# Patient Record
Sex: Female | Born: 1990 | Race: Black or African American | Hispanic: No | Marital: Married | Smoking: Never smoker
Health system: Southern US, Community
[De-identification: ages and names within clinical notes are randomized; demographics above are authoritative.]

## PROBLEM LIST (undated history)

## (undated) ENCOUNTER — Inpatient Hospital Stay (HOSPITAL_COMMUNITY): Payer: Self-pay

## (undated) DIAGNOSIS — K219 Gastro-esophageal reflux disease without esophagitis: Secondary | ICD-10-CM

## (undated) HISTORY — DX: Gastro-esophageal reflux disease without esophagitis: K21.9

## (undated) HISTORY — PX: NO PAST SURGERIES: SHX2092

---

## 2012-03-16 ENCOUNTER — Encounter (HOSPITAL_COMMUNITY): Payer: Self-pay | Admitting: *Deleted

## 2012-03-16 ENCOUNTER — Emergency Department (HOSPITAL_COMMUNITY)
Admission: EM | Admit: 2012-03-16 | Discharge: 2012-03-16 | Disposition: A | Discharge status: Home / Self Care | Payer: Self-pay | Source: Home / Self Care | Attending: Emergency Medicine | Admitting: Emergency Medicine

## 2012-03-16 DIAGNOSIS — Z3400 Encounter for supervision of normal first pregnancy, unspecified trimester: Secondary | ICD-10-CM

## 2012-03-16 DIAGNOSIS — O210 Mild hyperemesis gravidarum: Secondary | ICD-10-CM

## 2012-03-16 DIAGNOSIS — Z3201 Encounter for pregnancy test, result positive: Secondary | ICD-10-CM

## 2012-03-16 LAB — POCT URINALYSIS DIP (DEVICE)
Protein, ur: 30 mg/dL — AB
Specific Gravity, Urine: >=1.03 (ref 1.005–1.030)
Urobilinogen, UA: 1 mg/dL (ref 0.0–1.0)

## 2012-03-16 MED ORDER — DOXYLAMINE-PYRIDOXINE 10-10 MG PO TBEC: 1.0000 | DELAYED_RELEASE_TABLET | Freq: Four times a day (QID) | ORAL | Status: DC

## 2012-03-16 MED ORDER — ONDANSETRON 8 MG PO TBDP: 8.0000 mg | ORAL_TABLET | Freq: Three times a day (TID) | ORAL | Status: DC | PRN

## 2012-03-16 NOTE — ED Notes (Signed)
Pt  Reports   Symptoms  Of  Vomiting   From  Pregnancy   She  Reports      She  Is  About  1  Month  Pregnant           And        Did  A  Home  Pregnancy  Test    Which  She  The    Results  She  denys  Any  Bleeding         No  Active  Vomiting   -  She  Is  Sitting  Upright on  Exam table  In no  Severe  Distress   Husband  At  Bedside  No ob gyn yet

## 2012-03-16 NOTE — ED Provider Notes (Signed)
Chief Complaint  Patient presents with  . Emesis    History of Present Illness:    The patient is a 21 year old female who's last menstrual period was November 7. She took a home pregnancy test and was positive. She has had some other symptoms of pregnancy such as breast tenderness and frequent urination. Over the past several weeks she's developed nausea and vomiting. Over the past couple days she vomited up all by mouth intake. She denies fever, chills, abdominal pain, vaginal discharge or spotting, or urinary symptoms. This is her first pregnancy. She has not gotten any prenatal care yet.  Review of Systems:  Other than noted above, the patient denies any of the following symptoms: Constitutional:  No fever, chills, fatigue, weight loss or anorexia. Lungs:  No cough or shortness of breath. Heart:  No chest pain, palpitations, syncope or edema.  No cardiac history. Abdomen:  No nausea, vomiting, hematememesis, melena, diarrhea, or hematochezia. GU:  No dysuria, frequency, urgency, or hematuria. Gyn:  No vaginal discharge, itching, abnormal bleeding, dyspareunia, or pelvic pain.  PMFSH:  Past medical history, family history, social history, meds, and allergies were reviewed along with nurse's notes.  No prior abdominal surgeries, past history of GI problems, STDs or GYN problems.  No history of aspirin or NSAID use.  No excessive alcohol intake.  Physical Exam:   Vital signs:  BP 117/81  Pulse 78  Temp 99.6 F (37.6 C) (Oral)  Resp 17  SpO2 100% Gen:  Alert, oriented, in no distress. Lungs:  Breath sounds clear and equal bilaterally.  No wheezes, rales or rhonchi. Heart:  Regular rhythm.  No gallops or murmers.   Abdomen:  Soft, flat, nondistended. No tenderness to palpation, guarding, or rebound. No organomegaly or mass. Bowel sounds are normally active. Skin:  Clear, warm and dry.  No rash.  Labs:   Results for orders placed during the hospital encounter of 03/16/12  POCT  URINALYSIS DIP (DEVICE)      Component Value Range   Glucose, UA NEGATIVE  NEGATIVE mg/dL   Bilirubin Urine SMALL (*) NEGATIVE   Ketones, ur >=160 (*) NEGATIVE mg/dL   Specific Gravity, Urine >=1.030  1.005 - 1.030   Hgb urine dipstick MODERATE (*) NEGATIVE   pH 5.5  5.0 - 8.0   Protein, ur 30 (*) NEGATIVE mg/dL   Urobilinogen, UA 1.0  0.0 - 1.0 mg/dL   Nitrite NEGATIVE  NEGATIVE   Leukocytes, UA NEGATIVE  NEGATIVE  POCT PREGNANCY, URINE      Component Value Range   Preg Test, Ur POSITIVE (*) NEGATIVE    Assessment:  The primary encounter diagnosis was Pregnancy, first. A diagnosis of Hyperemesis gravidarum was also pertinent to this visit.  Plan:   1.  The following meds were prescribed:   New Prescriptions   DOXYLAMINE-PYRIDOXINE 10-10 MG TBEC    Take 1 tablet by mouth 4 (four) times daily.   ONDANSETRON (ZOFRAN ODT) 8 MG DISINTEGRATING TABLET    Take 1 tablet (8 mg total) by mouth every 8 (eight) hours as needed for nausea.   2.  The patient was instructed in symptomatic care and handouts were given. 3.  The patient was told to return if becoming worse in any way, if no better in 3 or 4 days, and given some red flag symptoms that would indicate earlier return.  Follow up:  The patient was told to follow up with the obstetrics doctor who is on-call today which is the faculty practice  at [facility]. She was also told her that she should continue to have nausea and vomiting, or if she should develop any pregnancy related complications to go to the [facility] emergency room.    Reuben Likes, MD 03/16/12 [phone]

## 2012-03-19 NOTE — L&D Delivery Note (Signed)
\  Delivery Note At 3:03 AM a viable female was delivered via Vaginal, Spontaneous Delivery (Presentation: ; Occiput Anterior).  Baby presented floppy, with no tone and no spontaneous cry.  Thick mec was noted at time of delivery.   Code APGAR was called with prompt response and resuscitation by nursing staff and NICU team.  APGAR: 1, 6; 8  weight 6 lb 7.5 oz (2935 g).   Placenta status: Intact, Spontaneous.  However, large clot and tissue mass approx 10x6cm came out soon after baby.  When placenta delivered, there was a thick dark clot in the placental tissue.  Placenta sent to path.  Cord:3 vessel  with the following complications: None.  Cord pH: 7.07  Anesthesia: Epidural  Episiotomy: None Lacerations: None Suture Repair: na Est. Blood Loss (mL): 300  Mom to postpartum.  Baby to treatment nursery for observation of tachypnea .  Alaynah Schutter L 10/23/2012, 4:05 AM

## 2012-04-10 ENCOUNTER — Encounter: Payer: Self-pay | Admitting: Family Medicine

## 2012-04-10 ENCOUNTER — Ambulatory Visit (INDEPENDENT_AMBULATORY_CARE_PROVIDER_SITE_OTHER): Payer: 59 | Admitting: Family Medicine

## 2012-04-10 VITALS — BP 121/87 | Temp 99.0°F | Ht 68.0 in | Wt 124.0 lb

## 2012-04-10 DIAGNOSIS — Z3490 Encounter for supervision of normal pregnancy, unspecified, unspecified trimester: Secondary | ICD-10-CM | POA: Insufficient documentation

## 2012-04-10 DIAGNOSIS — Z23 Encounter for immunization: Secondary | ICD-10-CM

## 2012-04-10 DIAGNOSIS — Z3400 Encounter for supervision of normal first pregnancy, unspecified trimester: Secondary | ICD-10-CM

## 2012-04-10 DIAGNOSIS — O210 Mild hyperemesis gravidarum: Secondary | ICD-10-CM

## 2012-04-10 LAB — POCT URINALYSIS DIP (DEVICE)
Protein, ur: 100 mg/dL — AB
Specific Gravity, Urine: >=1.03 (ref 1.005–1.030)
Urobilinogen, UA: 1 mg/dL (ref 0.0–1.0)
pH: 6 (ref 5.0–8.0)

## 2012-04-10 LAB — HIV ANTIBODY (ROUTINE TESTING W REFLEX): HIV: NONREACTIVE

## 2012-04-10 MED ORDER — GLYCOPYRROLATE 2 MG PO TABS: 2.0000 mg | ORAL_TABLET | Freq: Three times a day (TID) | ORAL | Status: DC

## 2012-04-10 MED ORDER — PROMETHAZINE HCL 25 MG PO TABS: 25.0000 mg | ORAL_TABLET | Freq: Four times a day (QID) | ORAL | Status: DC | PRN

## 2012-04-10 MED ORDER — INFLUENZA VIRUS VACC SPLIT PF IM SUSP
0.5000 mL | Freq: Once | INTRAMUSCULAR | Status: AC
  Administered 2012-04-10: 0.5 mL via INTRAMUSCULAR

## 2012-04-10 NOTE — Addendum Note (Signed)
Addended by: Franchot Mimes on: 04/10/2012 01:48 PM   Modules accepted: Orders

## 2012-04-10 NOTE — Addendum Note (Signed)
Addended by: Franchot Mimes on: 04/10/2012 12:41 PM   Modules accepted: Orders

## 2012-04-10 NOTE — Patient Instructions (Addendum)
L'allaitement maternel Dcider d'allaiter est l'un des [employer]rex Corporation que vous pouvez faire pour vous et votre bb . L'information Merchandiser, retail un bref aperu des avantages de l'allaitement ainsi que des thmes communs allaitement environnante. Avantages de Engineer, drilling bb Le premier lait ( colostrum ) permet le fonctionnement du systme digestif du bb mieux . Il ya des [employer] Holding Corporation lait de la mre qui aident le bb  combattre les infections . Le bb a une plus faible incidence de l'asthme , les allergies , et syndrome de mort subite du nourrisson [facility] ) . Les nutriments contenus Secondary school teacher lait maternel sont mieux pour le bb que les prparations pour nourrissons et le lait maternel aide le cerveau du bb se dveloppent mieux . Les bbs [employer] de gaz , les coliques et la constipation. Pour la mre L'allaitement maternel contribue  dvelopper un lien trs spcial entre la mre et son bb . L'allaitement maternel est pratique , toujours disponible  la bonne temprature , et ne cote rien . L'allaitement brle les calories dans la mre et l'aide  perdre du poids qui a t acquise au cours de la [place] . L'allaitement rend le contrat de l'utrus vers le bas  la taille normale plus rapide et ralentit les saignements aprs l'accouchement . Les mres qui allaitent ont un risque plus [place] de dvelopper un cancer du sein . ALLAITEMENT FRQUENCE Un bb en bonne sant  terme peut allaiter aussi souvent que toutes les heures ou espace ses ttes pour toutes les 3 heures . Surveillez votre bb pour Lowe's Companies de [place] . Allaiter votre bb si elle montre des signes de [address] . Combien de fois vous infirmire varie d'un bb  . Infirmire aussi souvent que les demandes de bb, ou quand vous vous sentez le besoin de rduire la plnitude de vos seins . Rveillez le bb si elle a t 3  4 heures Education administrator . Une alimentation frquente aidera  la mre faire plus de lait et aidera  prvenir les problmes, tels que les mamelons douloureux et l'engorgement des seins . POSITION DU BB AU SEIN Que couch ou assis , assurez-vous que le ventre de l'enfant est confront  votre ventre . Soutenir Human resources officer 4 doigts sous le sein et le pouce au-dessus . Assurez-vous que vos doigts sont bien loin du mamelon et la [employer] Motors bb . AVC lvres de bb doucement avec votre doigt ou du mamelon . Lorsque la bouche du bb est ouvert assez large , placer tous de votre mamelon et autant de l'arole que possible dans la bouche de votre bb . Tirez le bb en troite afin de la pointe du nez et les joues du bb touchent le sein pendant la tte . Ttes et d'aspiration La dure de chaque tte varie d'un bb  nourrir et  partir de l'alimentation . Le bb doit aspirer environ 2 3 minutes pour votre lait pour se rendre  [employer] . C'est ce qu'on appelle un  laisser tomber . Pour cette raison , Counselling psychologist bb se nourrir de chaque sein aussi longtemps qu'il ou elle veut . Votre bb va finir l'alimentation quand il ou elle a reu le bon quilibre de nutriments . Pour briser IT consultant , Administrator, arts votre [employer] Corporation coin de la bouche du bb et faites-le glisser entre ses gencives avant de retirer le sein de sa bouche . Cela aidera  prvenir les mamelons douloureux . COMMENT  SAVOIR SI VOTRE bb reoit assez de LAIT MATERNEL . Vous vous demandez si votre bb reoit suffisamment de lait est une proccupation commune Cisco . Vous pouvez tre assur que votre bb reoit assez de lait si : Votre bb est activement sucer et vous entendez la dglutition . Votre bb semble dtendu et satisfait aprs une tte . Votre bb infirmires au moins 8  12 reprises dans un laps de [place] de 24 heures . Infirmire votre bb jusqu' ce qu'il se dverrouille ou s'endort au premier sein (au moins 10 20 minutes) , puis offrir le deuxime ct . Votre bb  mouille 5 6 couches jetables ( 6 8 couches en tissu ) dans une priode de 24 heures par 5 6 jours d'ge. Votre bb a au moins 3 selles 4 toutes les 24 heures pendant les 6 premires semaines . Le tabouret doit tre souple et jaune . Votre bb devrait gagner 4 7 onces par semaine aprs qu'il ou elle est de 4 jours. Vos seins sont plus mous aprs la tte . RDUCTION engorgement mammaire Dans la premire semaine aprs la naissance du bb , vous pouvez rencontrer des signes de l'engorgement des seins . Lorsque les seins sont engorgs , ils se sentent lourd, chaud , plein , et peuvent tre [employer] . Vous pouvez rduire l'engorgement si vous : Infirmire frquemment, tous les 2 trois heures . Les mres qui allaitent ont tt et souvent moins de Cytogeneticist. Placez des blocs de glace de lumire sur vos seins pendant 10 20 minutes entre les ttes . Cela permet de rduire Financial planner. Envelopper les packs de glace dans une serviette lgre pour protger votre peau . Sacs de lgumes congels fonctionnent bien  cette fin . Prendre une douche chaude ou appliquer chaud , la chaleur humide de votre poitrine pendant 5 10 minutes juste avant chaque tte . Ceci augmente la circulation et permet l'coulement de lait . Massez doucement votre poitrine avant et pendant l'alimentation . Avec vos doigts , massez de la paroi thoracique vers votre mamelon dans un Tour manager . Assurez-vous que le bb se jette au moins un sein  chaque repas avant de Database administrator de camp . Utiliser un tire-lait pour vider les seins si votre bb est endormi ou non l'allaitement bien . Vous pouvez galement pomper si vous retournez au travail ou que vous sentez que vous avez trouv engorg . viter le biberon , ttines, ou des supplments alimentaires, d'eau ou de jus  la place de Statistician . Le lait maternel est toute la nourriture a besoin de votre bb . Il n'est pas ncessaire pour votre  bb  avoir de l'eau ou de la formule . En fait , pour aider vos seins font plus de lait , il est prfrable de ne pas donner  votre bb des supplments alimentaires durant les premires semaines . Assurez-vous que le bb prend correctement positionn et Chief Financial Officer . Portez un soutien-gorge , en vitant modles  armature . Mangez une alimentation quilibre avec suffisamment de liquides . Reposez-vous souvent , se dtendre et prendre vos vitamines prnatales pour viter la fatigue , le stress et l'anmie . Si vous suivez ces suggestions , votre engorgement devrait s'amliorer en 24 48 heures . Si vous rencontrez toujours des difficults , appelez votre consultante en lactation ou un soignant . Prendre soin de soi Prenez soin de vos seins Prenez un bain ou une douche tous les jours . vitez Dillard's  savon sur vos mamelons . Lancer ttes sur votre sein gauche  une alimentation et sur ??votre poitrine droite  la prochaine tte . Vous remarquerez une augmentation dans la production de lait 2 5 jours aprs l'accouchement. Vous pouvez ressentir un certain inconfort de l'engorgement , ce qui rend vos seins trs fermes et souvent tendre . Engorgement des [employer] Channel Communications 24 48 heures . Dans l'intervalle, appliquer des serviettes humides chaudes sur vos seins pendant 5 10 minutes avant le repas. Massage doux et l'expression de lait avant la tte va adoucir vos seins , ce qui rend plus facile pour votre bb prenne . Portez un soutien-gorge de soins infirmiers et Research scientist (medical) , et scher  l'air vos mamelons pendant 3 4 minutes aprs chaque tte . N'utilisez que des garnitures de soutien du coton . N'utilisez que la lanoline pure sur vos mamelons aprs la tte . Vous n'avez pas besoin de Psychologist, clinical de le nourrir  [employer] [place] [place] [employer] bb . Une autre option consiste  exprimer quelques gouttes de lait maternel et masser doucement dans vos mamelons . Prenez soin de vous Prenez des repas bien  quilibrs et des collations nutritives . Lait , jus de fruits et l'eau potable pour Alcoa Inc ( environ 8 verres par [employer] ) . Prenez beaucoup de repos . vitez les aliments que vous remarquez incidence sur le bb dans une [place] . Obtenir des Land O'Lakes mdicaux SI : Vous avez des Engineer, maintenance (IT) et besoin d'aide . Vous avez une zone [employer] M[employer] dure, rouge , sur votre poitrine qui est accompagne d'une fivre. Votre bb est trop fatigu pour [facility] ou a du mal  dormir . Votre bb mouille moins de 6 couches par jour , de 5 jours d'ge . La peau de votre bb ou blanc de ses yeux est plus jaune que c'tait  l'hpital . Vous vous sentez dprim . Document de Sortie: 03/05/2005 Document de rvision: 18/08/2011 Document de rvision: 17/05/2011 ExitCare  Information pour les patients  2013 [place] , [place] . Hypermse Hypermse est une forme grave de nauses et de vomissements qui se passe pendant la grossesse . Hyperemesis est pire que la Masco Corporation . Il peut causer une femme d'avoir des nauses ou des vomissements toute la journe pendant plusieurs jours. Il peut garder IAC[employer]InterActiveCorp femme de manger et de boire suffisamment d' aliments et les liquides . Hyperemesis survient gnralement au cours du premier semestre ( les 20 premires semaines ) de [place] . Il disparat souvent une fois une femme est dans sa deuxime moiti de la [place] . Cependant, parfois hyperemesis se poursuit  travers toute [employer] . CAUSES La cause de cette condition n'est pas compltement connue , mais on pense tre due  des Land O'Lakes hormones du corps pendant la grossesse. Ce pourrait tre le niveau lev de l'hormone de grossesse ou une augmentation du taux d'strogne Verizon. Symptmes Nauses et vomissements svres . Nauses qui ne va pas plus loin . Vomissements qui ne vous permet pas de garder aucune nourriture . La perte de poids et la perte de  fluide corporel (dshydratation) . N'ayant pas envie de manger ou de ne pas aimer la nourriture que vous avez dj connu . DIAGNOSTIC Votre fournisseur de Bear Stearns poser des questions sur vos symptmes . Votre fournisseur de Arts development officer des tests sanguins et d'urine pour s'assurer que quelque chose n'est pas la cause du problme . TRAITEMENT Vous ne Coca[employer]Cola  besoin de mdicaments pour SYSCO . Si les mdicaments ne contrlent pas la nause et des vomissements , vous serez traits  l'hpital pour prvenir la dshydratation , l'acidose , la perte de poids , et les Land O'Lakes lectrolytes dans l'organisme qui peut nuire au bb  natre ( ftus ) . Vous devrez peut-tre par voie intraveineuse ( IV ) des fluides . INSTRUCTIONS DE SOINS  DOMICILE Prenez tous les mdicaments comme indiqu par votre fournisseur de [place] . Essayez de manger un couple de biscuits secs ou du pain Animator avant de sortir du lit . vitez les aliments et les odeurs qui drangent l'estomac . vitez les aliments gras et pics . Manger 5 ou 6 petits repas par [employer] . Ne buvez pas en Ingram Micro Inc . Boire entre Berks[employer] Hathaway . Pour les collations , manger des aliments riches en protines , Print production planner . Manger ou sucer des choses qui ont gingembre en eux. Le gingembre aide des nauses . vitez la prparation des aliments . L' odeur de la nourriture peut gcher votre apptit . vitez des comprims de fer et fer dans vos multivitamines qu'aprs 3  4 mois de [place] . Obtenir des Land O'Lakes mdicaux SI : Votre douleur abdominale augmente depuis la dernire fois que vous avez vu votre soignant . Vous avez un mal de tte svre. Vous dveloppez des problmes de vision . Vous sentez que vous perdez Kellogg . CONSULTEZ LES SOINS SI : Vous tes incapable de garder les liquides vers le bas. Vous vomissez du sang . Vous avez des nauses et vomissements constants . Vous avez  de la fivre . Vous avez une faiblesse excessive , des tourdissements , des vanouissements ou une soif extrme . ASSUREZ-VOUS : Comprendre ces instructions . Va regarder votre tat . Obtiendront de Celanese Corporation de suite si vous ne faites pas Designer, fashion/clothing . Document de Sortie: 03/05/2005 Document de rvision: 01/20/2012 Document de rvision: 06/05/2010 ExitCare  Information pour les patients  2013 [place] , [place] . Grossesse - Premier trimestre [employer] rapports sexuels , des millions de Museum/gallery curator vagin . Seulement 1 sperme pntrer et fconder l' ovule de la femme alors qu'il est dans la trompe de [place] . Une semaine plus tard , l'ovule fcond dans la paroi de l'utrus . Un embryon commence  se dvelopper en un bb . 6  8 semaines, les yeux et [employer] forms et le rythme cardiaque peut tre vu  l'chographie. A la fin de 12 semaines (le premier trimestre ) , tous les PPG Industries bb sont forms . Maintenant que vous tes enceinte, vous aurez envie de Lubrizol Corporation possible pour Jacobs Engineering un bb en bonne sant . Deux des Honeywell plus Kellogg d'obtenir de bons soins prnatals et de suivre les instructions de votre fournisseur de [place] . Les soins prnataux sont tous les soins mdicaux que vous recevez avant la naissance du bb . Il est administr afin de prvenir , rechercher et traiter les problmes au cours de la grossesse et de Architectural technologist . EXAMENS PRNATALES Au cours des visites prnatales , votre poids , la pression artrielle et l'urine sont vrifis . Ceci est fait pour s'assurer que vous tes en bonne sant et progresse normalement au cours de la [place] . Une femme enceinte devrait gagner 25  35 livres au cours de la [place] . Toutefois, si vous avez plus de poids ou une insuffisance pondrale ,  votre fournisseur de Nutritional therapist au sujet de votre poids. Votre fournisseur de soins sera poser et rpondre  des questions Intel. Des analyses de sang , des cultures cervicales , d'autres tests ncessaires et un test de Pap sont effectus lors de vos examens prnataux . Ces tests sont effectus pour vrifier sur votre sant et celle probable de votre bb . Tests Il est fortement recommand et fait pour le VIH , [employer] votre permission . C'est le virus qui cause [employer] . Ces tests sont effectus parce que les mdicaments peuvent tre donns pour aider  prvenir votre bb de natre avec cette infection si vous avez t infect sans Emergency planning/management officer. Des analyses de sang est galement utilis pour trouver votre type de sang , infections antrieures et suivre votre taux sanguins ( hmoglobine ) . Faible taux d'hmoglobine ( anmie ) est frquente pendant la grossesse . Fer et de vitamines sont donns pour Doctor, hospital . Plus tard [employer]bury [employer] , des analyses de sang pour le diabte se fera avec d'autres tests si des problmes apparaissent . Vous pouvez avoir besoin de tests pour s'assurer que vous et Devon Energy bb se portent bien. Vous devrez peut-tre d'autres tests pour s'assurer que vous et le bb se portent bien. CHANGEMENTS AU COURS DU PREMIER TRIMESTRE (les 3 premiers mois de grossesse ) Peter Kiewit Sons passe par de Chartered loss adjuster au cours de la grossesse . Ils varient de [employer] . Parlez-en  votre fournisseur de [employer]in changements que vous avez remarqus et inquitent . Les modifications peuvent inclure : Votre priode Education officer, museum . L' ovule et le sperme portent les gnes qui dterminent  quoi vous ressemblez . Les gnes de vous et votre partenaire forment un bb . Les gnes masculins dterminer si le bb est un garon ou [place] . Votre corps augmente la circonfrence et vous pouvez sentir ballonn . Se sentant mal  l'estomac ( la nause ) et vomissements ( vomissements ) . Si le vomissement est incontrlable , appelez votre fournisseur de soins . Vos seins vont commencer  agrandir et  deviennent tendres . Vos mamelons peuvent tenir le coup plus et devenir plus sombre . Le besoin d'uriner plus . Miction douloureuse peut signifier que vous avez une infection de la vessie . Fatiguer facilement. Perte d'apptit . Les envies de certains types d'aliments . Dans un premier [place] , vous pouvez gagner ou perdre quelques kilos . Vous avez peut-tre des Peter Kiewit Sons vos motions au jour le jour ( excits d'tre quelque chose enceinte ou concern peut aller mal avec la grossesse et le bb ) . Vous pouvez avoir des rves plus vives et tranges . INSTRUCTIONS DE SOINS  DOMICILE Il est trs important d'viter toute tabagisme, l'alcool et les drogues non prescrites pendant votre grossesse . Ceux-ci affectent la formation et la croissance de [place] . vitez les produits chimiques pendant la grossesse afin d'assurer la livraison d'un enfant en bonne sant . Commencez vos visites prnatales par la 12me semaine de grossesse . Ils sont gnralement programmes mensuellement au dbut, puis plus souvent dans les 2 derniers mois avant la livraison . Gardez vos rendez-vous soignant . Suivez les instructions de votre fournisseur de Owens [employer] , du sang et des tests de [place] , l'exercice et l'alimentation . Pendant la grossesse , vous fournissez de la nourriture pour vous et votre bb . Prenez des repas rguliers , bien quilibrs . Choisissez des  aliments tels que la viande , le poisson , le lait et autres produits laitiers faibles en gras , les lgumes , les fruits et les pains de grains entiers et les crales . Votre fournisseur de soins vous dira de la prise de poids idale . Vous pouvez aider les Advanced Micro Devices en gardant craquelins au chevet . Mangez un couple Barista . Vous pouvez utiliser les craquelins sans sel sur eux . Manger 4 ou 5 petits repas plutt que trois gros repas par jour The Progressive Corporation aider les nauses et les vomissements  . Boire des Solectron Corporation repas plutt que pendant les repas de semble galement aider les nauses et les vomissements . Une relation sexuelle physique peut tre poursuivi pendant toute la grossesse si il n'y a pas d'autres problmes . Les problmes peuvent tre prcoce Air cabin crew ) fuite de liquide amniotique des membranes , des saignements vaginaux , ou le ventre douleur ( abdominale ) . Exercer rgulirement s'il n'y a pas de restrictions . Vrifiez avec votre fournisseur de soins ou un physiothrapeute si vous n'tes pas sr de la scurit de certains de vos exercices . Gain suprieur de poids se produira dans les deux derniers trimestres de Neurosurgeon . Exercice volont de l'aide: Estate agent. Vous maintenir en forme . Prparez-vous pour Avaya et [employer]. Vous aider  perdre votre poids de grossesse aprs l'accouchement de votre bb . Porter un bon soutien -gorge ou le jogging pour la sensibilit des seins pendant la grossesse . Cela peut aider si port pendant le sommeil trop . Demandez quand les cours prnataux sont disponibles . Commencer les cours quand ils sont offerts . Ne pas Bank of America , [address] ou saunas . Portez votre ceinture de scurit lors de Musician . Cela vous et votre bb protge si vous tes dans un accident . vitez la viande crue , non cuite , les litires de chat et de sol utilis par S[employer] long de la [place] . Ceux-ci portent les germes qui peuvent causer des malformations congnitales Merchant navy officer bb. Le premier trimestre est un bon moment pour visiter votre dentiste pour votre sant dentaire . Se nettoyer vos dents est [place] . Utilisez une brosse  dents douce et brossez doucement pendant la grossesse . Demandez de l'aide si vous avez financire, des conseils ou des besoins nutritionnels pendant la [place] . Votre fournisseur de Conservator, museum/gallery sera en mesure d'offrir des conseils  ces besoins ainsi que vous renvoyer  d'autres  besoins particuliers . Ne pas prendre des ONEOK ou des herbes moins que dit par votre fournisseur de [place] . Informez votre fournisseur de soins s'il existe une violence conjugale physique ou [place] . Faites une liste de numros de tlphone d'urgence de la famille , les amis, l'hpital , et la police et les pompiers . crivez vos questions . Prenez-les  votre visite prnatale . vitez les douches vaginales . Ne croisez pas vos jambes . Si vous devez rester debout pendant de longues priodes de temps , vous tournez les pieds ou faire de petits pas dans un cercle . Vous pouvez avoir plus de scrtions vaginales qui peuvent ncessiter une serviette hyginique . Ne pas utiliser des tampons ou des serviettes hyginiques parfumes . MDICAMENTS ET DROGUES DANS LA GROSSESSE Prendre des vitamines prnatales comme indiqu. La vitamine doit contenir 1 mg d'acide folique . Gardez toutes les vitamines hors de LandAmerica Financial. Seul un couple de vitamines ou de comprims contenant  du fer peuvent tre [employer]net un bb ou un jeune enfant lorsqu'il est ingr . viter l'utilisation de tous les mdicaments, y compris les herbes , over-the- mdicaments en vente libre , sans ordonnance ou Nature conservation officer par [employer] fournisseur de [place] . Ne prenez plus -the-counter ou de prescription de mdicaments pour Liz Claiborne , de l'inconfort ou de la fivre comme dirig par votre fournisseur de [place] . Ne pas utiliser l'aspirine , l'ibuprofne , le naproxne ou  moins que votre fournisseur de [place] . Laissez votre fournisseur de Conservator, museum/gallery sur ??les plantes que vous utilisez peut-tre . L'alcool est li  un certain nombre de malformations congnitales . Cela inclut le syndrome d'alcoolisme foetal. Toutes les boissons alcoolises , sous quelque forme , devrait tre compltement vite . Fumer provoque faible taux de natalit et les bbs prmaturs . Rue ou de drogues illicites sont trs nocifs [employer] bb .  Ils sont absolument interdits. Un bb n d'une mre accro sera [place] . Le bb va passer par le mme retrait [employer]. Laissez votre fournisseur de Smith International autres mdicaments que vous devez prendre et pour [facility] raison vous prenez eux. La fausse couche est frquent pendant la grossesse Une fausse couche ne veut pas dire que vous avez fait quelque chose de mal . Ce n'est pas une raison de s'inquiter de tomber Academic librarian . Votre fournisseur de soins vous Bank of America questions que vous pourriez avoir . Si vous avez une erreur , vous pouvez avoir besoin de [place] . Obtenir des Land O'Lakes mdicaux SI : Vous avez des questions ou des inquitudes au cours de votre grossesse . Il est prfrable d'appeler avec vos questions, si vous vous sentez qu'ils ne peuvent pas vous soucier de les attendre , [employer]. Holdings . CONSULTEZ LES SOINS SI : Une temprature orale inexplique ci-dessus 102  F ( 38,9  C ) dveloppe , ou que votre fournisseur de W.[employer]. [employer] Inc . Vous avez une fuite de liquide du vagin ( canal de naissance ) . Si les membranes qui fuient sont souponns , prenez votre temprature et d'informer votre fournisseur de soins de cette lorsque vous appelez . Il est vaginal spotting ou des saignements . Informez votre fournisseur de Land O'Lakes de la quantit et le nombre de [employer]Zone utilises . Vous dveloppez une Yahoo! Inc odeur des pertes vaginales avec un changement de Network engineer . Vous continuer  se sentir mal  l'estomac ( nauses ) et Liz Claiborne pas de soulagement de solutions proposes . Vous vomissez du sang ou des matriaux de Scientist, forensic caf . Vous perdez plus de 2 kilos de poids en 1 semaine . Vous gagnez plus de 2 kilos de poids en 1 semaine et vous remarquez une Bear Stearns , des mains , des pieds ou des [place] . Vous gagnez 5 livres ou plus dans 1 semaine ( mme si vous n'avez pas de gonflement de vos mains , le visage , les [place] , ou pieds)  . Vous tes expos  la rubole et ne les avez jamais eu . Vous tes expos  la cinquime maladie ou la varicelle . Vous dveloppez ventre douleur ( abdominale ) . Round inconfort ligament est un non - cancreuses (bnignes ) cause frquente de douleurs abdominales pendant la grossesse . Votre fournisseur de soins doit toujours valuer cette question. Vous dveloppez des maux de tte , de la fivre , de la diarrhe , des First Data Corporation ,  ou d'essoufflement . Vous tomber ou Bank of America un accident de voiture ou avoir n'importe quel type de traumatisme . Il est la violence physique ou Secondary school teacher . Document de Sortie: 02/27/2001 Document de rvision: 01/20/2012 Document de rvision: 08/31/2008 ExitCare  Information pour les patients  2013 [place] , [place] .

## 2012-04-10 NOTE — Progress Notes (Signed)
   Subjective:    Rhonda Fischer is a G1P0 [redacted]w[redacted]d being seen today for her first obstetrical visit.  Her obstetrical history is significant for language barrier. Patient does intend to breast feed. Pregnancy history fully reviewed.  Patient reports nausea, vomiting and weight loss and spitting.[facility] Mons Vitals:   04/10/12 1030 04/10/12 1035  BP: 121/87   Temp: 99 F (37.2 C)   Height:  5\' 8"  (1.727 m)  Weight: 124 lb (56.246 kg)     HISTORY: OB History    Grav Para Term Preterm Abortions TAB SAB Ect Mult Living   1              # Outc Date GA Lbr Len/2nd Wgt Sex Del Anes PTL Lv   1 CUR              Past Medical History  Diagnosis Date  . GERD (gastroesophageal reflux disease)    Past Surgical History  Procedure Date  . No past surgeries    History reviewed. No pertinent family history.   Exam    Uterus:     Pelvic Exam:    Perineum: Normal Perineum   Vulva: Bartholin's, Urethra, Skene's normal   Vagina:  normal mucosa, normal discharge       Cervix: no lesions and nulliparous appearance   Adnexa: normal adnexa   Bony Pelvis: average  System: Breast:  normal appearance, no masses or tenderness   Skin: normal coloration and turgor, no rashes    Neurologic: oriented   Extremities: normal strength, tone, and muscle mass   HEENT sclera clear, anicteric   Mouth/Teeth mucous membranes moist, pharynx normal without lesions   Neck supple   Cardiovascular: regular rate and rhythm   Respiratory:  appears well, vitals normal, no respiratory distress, acyanotic, normal RR, ear and throat exam is normal, neck free of mass or lymphadenopathy, chest clear, no wheezing, crepitations, rhonchi, normal symmetric air entry   Abdomen: soft, non-tender; bowel sounds normal; no masses,  no organomegaly   Urinary: bladder fullness present      Assessment:    Pregnancy: G1P0 Patient Active Problem List  Diagnosis  . Supervision of normal first pregnancy  . Hyperemesis  complicating pregnancy, antepartum        Plan:     Initial labs drawn. Prenatal vitamins. Problem list reviewed and updated.  Genetic Screening discussed First Screen: declined.  Ultrasound discussed; fetal survey: discussed.  Follow up in 4 weeks.   Micaella Gitto S 04/10/2012

## 2012-04-10 NOTE — Progress Notes (Signed)
P = 119 

## 2012-04-11 LAB — OBSTETRIC PANEL
Eosinophils Relative: 1 % (ref 0–5)
HCT: 42.1 % (ref 36.0–46.0)
Hemoglobin: 14.2 g/dL (ref 12.0–15.0)
Lymphocytes Relative: 15 % (ref 12–46)
Lymphs Abs: 1.3 10*3/uL (ref 0.7–4.0)
MCV: 76 fL — ABNORMAL LOW (ref 78.0–100.0)
Platelets: 202 10*3/uL (ref 150–400)
RBC: 5.54 MIL/uL — ABNORMAL HIGH (ref 3.87–5.11)
Rubella: 29.9 Index — ABNORMAL HIGH (ref <0.90)
WBC: 8.6 10*3/uL (ref 4.0–10.5)

## 2012-04-12 LAB — CULTURE, OB URINE

## 2012-04-14 LAB — HEMOGLOBINOPATHY EVALUATION
Hemoglobin Other: 0 %
Hgb A: 97.2 % (ref 96.8–97.8)

## 2012-05-08 ENCOUNTER — Ambulatory Visit (INDEPENDENT_AMBULATORY_CARE_PROVIDER_SITE_OTHER): Payer: 59 | Admitting: Obstetrics & Gynecology

## 2012-05-08 ENCOUNTER — Other Ambulatory Visit: Payer: Self-pay | Admitting: Obstetrics & Gynecology

## 2012-05-08 VITALS — BP 118/76 | Temp 97.9°F | Wt 124.0 lb

## 2012-05-08 DIAGNOSIS — Z3400 Encounter for supervision of normal first pregnancy, unspecified trimester: Secondary | ICD-10-CM

## 2012-05-08 DIAGNOSIS — Z789 Other specified health status: Secondary | ICD-10-CM

## 2012-05-08 DIAGNOSIS — Z758 Other problems related to medical facilities and other health care: Secondary | ICD-10-CM | POA: Insufficient documentation

## 2012-05-08 DIAGNOSIS — Z609 Problem related to social environment, unspecified: Secondary | ICD-10-CM

## 2012-05-08 DIAGNOSIS — Z603 Acculturation difficulty: Secondary | ICD-10-CM

## 2012-05-08 HISTORY — DX: Acculturation difficulty: Z60.3

## 2012-05-08 HISTORY — DX: Other problems related to medical facilities and other health care: Z75.8

## 2012-05-08 LAB — POCT URINALYSIS DIP (DEVICE)
Bilirubin Urine: NEGATIVE
Ketones, ur: NEGATIVE mg/dL
Leukocytes, UA: NEGATIVE
pH: 7 (ref 5.0–8.0)

## 2012-05-08 MED ORDER — PRENATAL VITAMINS 0.8 MG PO TABS: 1.0000 | ORAL_TABLET | Freq: Every day | ORAL | Status: DC

## 2012-05-08 NOTE — Progress Notes (Signed)
P=91, Used [employer] [phone]

## 2012-05-08 NOTE — Progress Notes (Signed)
U/S scheduled 05/08/12 at 930 am.

## 2012-05-08 NOTE — Patient Instructions (Addendum)
Return to clinic for any obstetric concerns or go to [facility]U for evaluation  

## 2012-05-08 NOTE — Progress Notes (Signed)
Nausea is still present but ameliorated with Promethazine and Robinul. Patient's husband used to work as a Librarian, academic for [employer], now works in Consulting civil engineer. They declined [employer] interpretation help, refusal of interpretation form signed and will be scanned into chart.  Declines quad screen. Anatomy scan ordered.  No other complaints or concerns.  Obstetric precautions reviewed.

## 2012-05-28 ENCOUNTER — Encounter: Payer: Self-pay | Admitting: *Deleted

## 2012-06-05 ENCOUNTER — Ambulatory Visit (HOSPITAL_COMMUNITY)
Admission: RE | Admit: 2012-06-05 | Discharge: 2012-06-05 | Disposition: A | Discharge status: Home / Self Care | Payer: 59 | Source: Ambulatory Visit | Attending: Obstetrics & Gynecology | Admitting: Obstetrics & Gynecology

## 2012-06-05 ENCOUNTER — Other Ambulatory Visit: Payer: Self-pay | Admitting: Family

## 2012-06-05 ENCOUNTER — Ambulatory Visit (INDEPENDENT_AMBULATORY_CARE_PROVIDER_SITE_OTHER): Payer: 59 | Admitting: Family

## 2012-06-05 VITALS — BP 104/67 | Temp 98.8°F | Wt 127.2 lb

## 2012-06-05 DIAGNOSIS — O210 Mild hyperemesis gravidarum: Secondary | ICD-10-CM

## 2012-06-05 DIAGNOSIS — Z3402 Encounter for supervision of normal first pregnancy, second trimester: Secondary | ICD-10-CM

## 2012-06-05 DIAGNOSIS — Z3400 Encounter for supervision of normal first pregnancy, unspecified trimester: Secondary | ICD-10-CM

## 2012-06-05 DIAGNOSIS — Z789 Other specified health status: Secondary | ICD-10-CM

## 2012-06-05 DIAGNOSIS — Z3689 Encounter for other specified antenatal screening: Secondary | ICD-10-CM | POA: Insufficient documentation

## 2012-06-05 LAB — POCT URINALYSIS DIP (DEVICE)
Bilirubin Urine: NEGATIVE
Glucose, UA: NEGATIVE mg/dL
Ketones, ur: NEGATIVE mg/dL
Specific Gravity, Urine: >=1.03 (ref 1.005–1.030)

## 2012-06-05 NOTE — Progress Notes (Signed)
Pulse: 89

## 2012-06-05 NOTE — Progress Notes (Signed)
No questions or concerns.  Continue with Robinul.

## 2012-06-05 NOTE — Progress Notes (Signed)
Nausea getting better, still taking Promethazine and Robinul. Has gained 3 pounds in past month, still 13 pounds below pre-pregnancy weight. Anatomy u/s today, results not available yet.

## 2012-06-06 ENCOUNTER — Encounter: Payer: Self-pay | Admitting: Obstetrics & Gynecology

## 2012-07-03 ENCOUNTER — Other Ambulatory Visit: Payer: Self-pay | Admitting: Obstetrics & Gynecology

## 2012-07-03 ENCOUNTER — Ambulatory Visit (INDEPENDENT_AMBULATORY_CARE_PROVIDER_SITE_OTHER): Payer: 59 | Admitting: Obstetrics & Gynecology

## 2012-07-03 VITALS — BP 114/67 | Temp 97.8°F | Wt 134.4 lb

## 2012-07-03 DIAGNOSIS — Z3480 Encounter for supervision of other normal pregnancy, unspecified trimester: Secondary | ICD-10-CM

## 2012-07-03 DIAGNOSIS — Z3492 Encounter for supervision of normal pregnancy, unspecified, second trimester: Secondary | ICD-10-CM

## 2012-07-03 LAB — POCT URINALYSIS DIP (DEVICE)
Bilirubin Urine: NEGATIVE
Glucose, UA: NEGATIVE mg/dL
Hgb urine dipstick: NEGATIVE
Ketones, ur: NEGATIVE mg/dL
Specific Gravity, Urine: 1.025 (ref 1.005–1.030)

## 2012-07-03 NOTE — Patient Instructions (Signed)
Contraception Choices  Contraception (birth control) is the use of any methods or devices to prevent pregnancy. Below are some methods to help avoid pregnancy.  HORMONAL METHODS   · Contraceptive implant. This is a thin, plastic tube containing progesterone hormone. It does not contain estrogen hormone. Your caregiver inserts the tube in the inner part of the upper arm. The tube can remain in place for up to 3 years. After 3 years, the implant must be removed. The implant prevents the ovaries from releasing an egg (ovulation), thickens the cervical mucus which prevents sperm from entering the uterus, and thins the lining of the inside of the uterus.  · Progesterone-only injections. These injections are given every 3 months by your caregiver to prevent pregnancy. This synthetic progesterone hormone stops the ovaries from releasing eggs. It also thickens cervical mucus and changes the uterine lining. This makes it harder for sperm to survive in the uterus.  · Birth control pills. These pills contain estrogen and progesterone hormone. They work by stopping the egg from forming in the ovary (ovulation). Birth control pills are prescribed by a caregiver. Birth control pills can also be used to treat heavy periods.  · Minipill. This type of birth control pill contains only the progesterone hormone. They are taken every day of each month and must be prescribed by your caregiver.  · Birth control patch. The patch contains hormones similar to those in birth control pills. It must be changed once a week and is prescribed by a caregiver.  · Vaginal ring. The ring contains hormones similar to those in birth control pills. It is left in the vagina for 3 weeks, removed for 1 week, and then a new one is put back in place. The patient must be comfortable inserting and removing the ring from the vagina. A caregiver's prescription is necessary.  · Emergency contraception. Emergency contraceptives prevent pregnancy after unprotected  sexual intercourse. This pill can be taken right after sex or up to 5 days after unprotected sex. It is most effective the sooner you take the pills after having sexual intercourse. Emergency contraceptive pills are available without a prescription. Check with your pharmacist. Do not use emergency contraception as your only form of birth control.  BARRIER METHODS   · Female condom. This is a thin sheath (latex or rubber) that is worn over the penis during sexual intercourse. It can be used with spermicide to increase effectiveness.  · Female condom. This is a soft, loose-fitting sheath that is put into the vagina before sexual intercourse.  · Diaphragm. This is a soft, latex, dome-shaped barrier that must be fitted by a caregiver. It is inserted into the vagina, along with a spermicidal jelly. It is inserted before intercourse. The diaphragm should be left in the vagina for 6 to 8 hours after intercourse.  · Cervical cap. This is a round, soft, latex or plastic cup that fits over the cervix and must be fitted by a caregiver. The cap can be left in place for up to 48 hours after intercourse.  · Sponge. This is a soft, circular piece of polyurethane foam. The sponge has spermicide in it. It is inserted into the vagina after wetting it and before sexual intercourse.  · Spermicides. These are chemicals that kill or block sperm from entering the cervix and uterus. They come in the form of creams, jellies, suppositories, foam, or tablets. They do not require a prescription. They are inserted into the vagina with an applicator before having sexual intercourse.   IUD). This is a T-shaped device that is put in a woman's uterus during a menstrual period to prevent pregnancy. There are 2 types:  Copper IUD. This type of IUD is wrapped in copper wire and is placed inside the uterus. Copper makes the uterus and  fallopian tubes produce a fluid that kills sperm. It can stay in place for 10 years.  Hormone IUD. This type of IUD contains the hormone progestin (synthetic progesterone). The hormone thickens the cervical mucus and prevents sperm from entering the uterus, and it also thins the uterine lining to prevent implantation of a fertilized egg. The hormone can weaken or kill the sperm that get into the uterus. It can stay in place for 5 years. PERMANENT METHODS OF CONTRACEPTION  Female tubal ligation. This is when the woman's fallopian tubes are surgically sealed, tied, or blocked to prevent the egg from traveling to the uterus.  Female sterilization. This is when the female has the tubes that carry sperm tied off (vasectomy).This blocks sperm from entering the vagina during sexual intercourse. After the procedure, the man can still ejaculate fluid (semen). NATURAL PLANNING METHODS  Natural family planning. This is not having sexual intercourse or using a barrier method (condom, diaphragm, cervical cap) on days the woman could become pregnant.  Calendar method. This is keeping track of the length of each menstrual cycle and identifying when you are fertile.  Ovulation method. This is avoiding sexual intercourse during ovulation.  Symptothermal method. This is avoiding sexual intercourse during ovulation, using a thermometer and ovulation symptoms.  Post-ovulation method. This is timing sexual intercourse after you have ovulated. Regardless of which type or method of contraception you choose, it is important that you use condoms to protect against the transmission of sexually transmitted diseases (STDs). Talk with your caregiver about which form of contraception is most appropriate for you. Document Released: 03/05/2005 Document Revised: 05/28/2011 Document Reviewed: 07/12/2010 [facility] Patient Information 2013 [place], [place]. Breastfeeding Deciding to breastfeed is one of the best choices you can make  for you and your baby. The information that follows gives a brief overview of the benefits of breastfeeding as well as common topics surrounding breastfeeding. BENEFITS OF BREASTFEEDING For the baby  The first milk (colostrum) helps the baby's digestive system function better.   There are antibodies in the mother's milk that help the baby fight off infections.   The baby has a lower incidence of asthma, allergies, and sudden infant death syndrome (SIDS).   The nutrients in breast milk are better for the baby than infant formulas, and breast milk helps the baby's brain grow better.   Babies who breastfeed have less gas, colic, and constipation.  For the mother  Breastfeeding helps develop a very special bond between the mother and her baby.   Breastfeeding is convenient, always available at the correct temperature, and costs nothing.   Breastfeeding burns calories in the mother and helps her lose weight that was gained during pregnancy.   Breastfeeding makes the uterus contract back down to normal size faster and slows bleeding following delivery.   Breastfeeding mothers have a lower risk of developing breast cancer.  BREASTFEEDING FREQUENCY  A healthy, full-term baby may breastfeed as often as every hour or space his or her feedings to every 3 hours.   Watch your baby for signs of hunger. Nurse your baby if he or she shows signs of hunger. How often you nurse will vary from baby to baby.   Nurse as often as  the baby requests, or when you feel the need to reduce the fullness of your breasts.   Awaken the baby if it has been 3 4 hours since the last feeding.   Frequent feeding will help the mother make more milk and will help prevent problems, such as sore nipples and engorgement of the breasts.  BABY'S POSITION AT THE BREAST  Whether lying down or sitting, be sure that the baby's tummy is facing your tummy.   Support the breast with 4 fingers underneath the  breast and the thumb above. Make sure your fingers are well away from the nipple and baby's mouth.   Stroke the baby's lips gently with your finger or nipple.   When the baby's mouth is open wide enough, place all of your nipple and as much of the areola as possible into your baby's mouth.   Pull the baby in close so the tip of the nose and the baby's cheeks touch the breast during the feeding.  FEEDINGS AND SUCTION  The length of each feeding varies from baby to baby and from feeding to feeding.   The baby must suck about 2 3 minutes for your milk to get to him or her. This is called a "let down." For this reason, allow the baby to feed on each breast as long as he or she wants. Your baby will end the feeding when he or she has received the right balance of nutrients.   To break the suction, put your finger into the corner of the baby's mouth and slide it between his or her gums before removing your breast from his or her mouth. This will help prevent sore nipples.  HOW TO TELL WHETHER YOUR BABY IS GETTING ENOUGH BREAST MILK. Wondering whether or not your baby is getting enough milk is a common concern among mothers. You can be assured that your baby is getting enough milk if:   Your baby is actively sucking and you hear swallowing.   Your baby seems relaxed and satisfied after a feeding.   Your baby nurses at least 8 12 times in a 24 hour time period. Nurse your baby until he or she unlatches or falls asleep at the first breast (at least 10 20 minutes), then offer the second side.   Your baby is wetting 5 6 disposable diapers (6 8 cloth diapers) in a 24 hour period by 16 55 days of age.   Your baby is having at least 3 4 stools every 24 hours for the first 6 weeks. The stool should be soft and yellow.   Your baby should gain 4 7 ounces per week after he or she is 43 days old.   Your breasts feel softer after nursing.  REDUCING BREAST ENGORGEMENT  In the first week after  your baby is born, you may experience signs of breast engorgement. When breasts are engorged, they feel heavy, warm, full, and may be tender to the touch. You can reduce engorgement if you:   Nurse frequently, every 2 3 hours. Mothers who breastfeed early and often have fewer problems with engorgement.   Place light ice packs on your breasts for 10 20 minutes between feedings. This reduces swelling. Wrap the ice packs in a lightweight towel to protect your skin. Bags of frozen vegetables work well for this purpose.   Take a warm shower or apply warm, moist heat to your breast for 5 10 minutes just before each feeding. This increases circulation and helps the  milk flow.   Gently massage your breast before and during the feeding. Using your finger tips, massage from the chest wall towards your nipple in a circular motion.   Make sure that the baby empties at least one breast at every feeding before switching sides.   Use a breast pump to empty the breasts if your baby is sleepy or not nursing well. You may also want to pump if you are returning to work oryou feel you are getting engorged.   Avoid bottle feeds, pacifiers, or supplemental feedings of water or juice in place of breastfeeding. Breast milk is all the food your baby needs. It is not necessary for your baby to have water or formula. In fact, to help your breasts make more milk, it is best not to give your baby supplemental feedings during the early weeks.   Be sure the baby is latched on and positioned properly while breastfeeding.   Wear a supportive bra, avoiding underwire styles.   Eat a balanced diet with enough fluids.   Rest often, relax, and take your prenatal vitamins to prevent fatigue, stress, and anemia.  If you follow these suggestions, your engorgement should improve in 24 48 hours. If you are still experiencing difficulty, call your lactation consultant or caregiver.  CARING FOR YOURSELF Take care of your  breasts  Bathe or shower daily.   Avoid using soap on your nipples.   Start feedings on your left breast at one feeding and on your right breast at the next feeding.   You will notice an increase in your milk supply 2 5 days after delivery. You may feel some discomfort from engorgement, which makes your breasts very firm and often tender. Engorgement "peaks" out within 24 48 hours. In the meantime, apply warm moist towels to your breasts for 5 10 minutes before feeding. Gentle massage and expression of some milk before feeding will soften your breasts, making it easier for your baby to latch on.   Wear a well-fitting nursing bra, and air dry your nipples for a 3 after each feeding.   Only use cotton bra pads.   Only use pure lanolin on your nipples after nursing. You do not need to wash it off before feeding the baby again. Another option is to express a few drops of breast milk and gently massage it into your nipples.  Take care of yourself  Eat well-balanced meals and nutritious snacks.   Drinking milk, fruit juice, and water to satisfy your thirst (about 8 glasses a day).   Get plenty of rest.  Avoid foods that you notice affect the baby in a bad way.  SEEK MEDICAL CARE IF:   You have difficulty with breastfeeding and need help.   You have a hard, red, sore area on your breast that is accompanied by a fever.   Your baby is too sleepy to eat well or is having trouble sleeping.   Your baby is wetting less than 6 diapers a day, by 42 days of age.   Your baby's skin or white part of his or her eyes is more yellow than it was in the hospital.   You feel depressed.  Document Released: 03/05/2005 Document Revised: 09/04/2011 Document Reviewed: 06/03/2011 [facility] Patient Information 2013 [place], [place].

## 2012-07-03 NOTE — Progress Notes (Signed)
Pulse: 85

## 2012-07-03 NOTE — Progress Notes (Signed)
Pt spitting.  No vomiting.  Pt gained 7 pounds in one month.  No complaints or problems.  Pt plans to breast feed and unsure of contraception.

## 2012-07-09 ENCOUNTER — Emergency Department (HOSPITAL_BASED_OUTPATIENT_CLINIC_OR_DEPARTMENT_OTHER)
Admission: EM | Admit: 2012-07-09 | Discharge: 2012-07-09 | Payer: 59 | Attending: Obstetrics & Gynecology | Admitting: Obstetrics & Gynecology

## 2012-07-09 ENCOUNTER — Encounter (HOSPITAL_BASED_OUTPATIENT_CLINIC_OR_DEPARTMENT_OTHER): Payer: Self-pay | Admitting: Emergency Medicine

## 2012-07-09 DIAGNOSIS — Y9301 Activity, walking, marching and hiking: Secondary | ICD-10-CM | POA: Insufficient documentation

## 2012-07-09 DIAGNOSIS — W19XXXA Unspecified fall, initial encounter: Secondary | ICD-10-CM

## 2012-07-09 DIAGNOSIS — M545 Low back pain, unspecified: Secondary | ICD-10-CM

## 2012-07-09 DIAGNOSIS — O99891 Other specified diseases and conditions complicating pregnancy: Secondary | ICD-10-CM

## 2012-07-09 DIAGNOSIS — Y929 Unspecified place or not applicable: Secondary | ICD-10-CM | POA: Insufficient documentation

## 2012-07-09 DIAGNOSIS — K219 Gastro-esophageal reflux disease without esophagitis: Secondary | ICD-10-CM | POA: Insufficient documentation

## 2012-07-09 DIAGNOSIS — IMO0002 Reserved for concepts with insufficient information to code with codable children: Secondary | ICD-10-CM | POA: Insufficient documentation

## 2012-07-09 DIAGNOSIS — Z79899 Other long term (current) drug therapy: Secondary | ICD-10-CM | POA: Insufficient documentation

## 2012-07-09 DIAGNOSIS — W108XXA Fall (on) (from) other stairs and steps, initial encounter: Secondary | ICD-10-CM | POA: Insufficient documentation

## 2012-07-09 DIAGNOSIS — O368190 Decreased fetal movements, unspecified trimester, not applicable or unspecified: Secondary | ICD-10-CM | POA: Insufficient documentation

## 2012-07-09 DIAGNOSIS — Z3490 Encounter for supervision of normal pregnancy, unspecified, unspecified trimester: Secondary | ICD-10-CM

## 2012-07-09 LAB — CBC WITH DIFFERENTIAL/PLATELET
Eosinophils Absolute: 0.2 10*3/uL (ref 0.0–0.7)
Eosinophils Relative: 2 % (ref 0–5)
Hemoglobin: 11.4 g/dL — ABNORMAL LOW (ref 12.0–15.0)
Lymphocytes Relative: 20 % (ref 12–46)
Lymphs Abs: 2.2 10*3/uL (ref 0.7–4.0)
MCH: 26.9 pg (ref 26.0–34.0)
MCV: 80.2 fL (ref 78.0–100.0)
Monocytes Relative: 10 % (ref 3–12)
Neutrophils Relative %: 69 % (ref 43–77)
RBC: 4.24 MIL/uL (ref 3.87–5.11)

## 2012-07-09 LAB — BASIC METABOLIC PANEL
BUN: 5 mg/dL — ABNORMAL LOW (ref 6–23)
CO2: 23 mEq/L (ref 19–32)
GFR calc non Af Amer: >90 mL/min (ref >90)
Glucose, Bld: 76 mg/dL (ref 70–99)
Potassium: 3.7 mEq/L (ref 3.5–5.1)

## 2012-07-09 MED ORDER — SODIUM CHLORIDE 0.9 % IV BOLUS (SEPSIS)
500.0000 mL | Freq: Once | INTRAVENOUS | Status: AC
  Administered 2012-07-09: 500 mL via INTRAVENOUS

## 2012-07-09 NOTE — ED Notes (Signed)
Pt fell down steps tonight. C/o lower back pain. Pt is 5 months pregnant

## 2012-07-09 NOTE — ED Notes (Signed)
Pt report given to Burnett Harry, Charity fundraiser with [facility].

## 2012-07-09 NOTE — ED Notes (Signed)
Pt does not speak english, native language is [place]

## 2012-07-09 NOTE — ED Notes (Signed)
Pt reports last fetal movement this afternoon, pt placed on toca and fetal monitor,  Fetal movement at time of placement of monitor

## 2012-07-09 NOTE — ED Notes (Signed)
Call placed to OB rapid response team and spoe to [facility]. Pt placed on fetal monitor per MD orders.

## 2012-07-09 NOTE — MAU Provider Note (Signed)
  History     CSN: [id number]  Arrival date and time: 07/09/12 0057   None     Chief Complaint  Patient presents with  . Fall  . Back Pain   HPI  Rhonda Fischer is a 22 y.o. G1P0000 at [redacted]w[redacted]d who presents after falling down seven stairs.  She fell earlier tonight on her back. She did not hit her abdomen at all and her abdomen does not hurt now. No fluid leaking or bleeding. Last felt baby move within the last few hours. She is not feeling any contractions. She currently has pain just in her lower back.  Was seen at [facility] and cleared medically then transported here to MAU for further monitoring.   OB History   Grav Para Term Preterm Abortions TAB SAB Ect Mult Living   1 0 0 0 0 0 0 0 0 0     first pregnancy - no problems so far  Past Medical History  Diagnosis Date  . GERD (gastroesophageal reflux disease)   no medical problems  Past Surgical History  Procedure Laterality Date  . No past surgeries      History reviewed. No pertinent family history.  History  Substance Use Topics  . Smoking status: Never Smoker   . Smokeless tobacco: Not on file  . Alcohol Use: No    Allergies: No Known Allergies  Prescriptions prior to admission  Medication Sig Dispense Refill  . glycopyrrolate (ROBINUL-FORTE) 2 MG tablet Take 1 tablet (2 mg total) by mouth 3 (three) times daily.  90 tablet  3  . Prenatal Multivit-Min-Fe-FA (PRENATAL VITAMINS) 0.8 MG tablet Take 1 tablet by mouth daily.  30 tablet  12  . promethazine (PHENERGAN) 25 MG tablet Take 1 tablet (25 mg total) by mouth every 6 (six) hours as needed for nausea.  42 tablet  2  . Doxylamine-Pyridoxine 10-10 MG TBEC Take 1 tablet by mouth 4 (four) times daily.  60 tablet  1  . ondansetron (ZOFRAN ODT) 8 MG disintegrating tablet Take 1 tablet (8 mg total) by mouth every 8 (eight) hours as needed for nausea.  20 tablet  0    ROS Physical Exam   Blood pressure 98/62, pulse 68, temperature 98.4 F (36.9 C),  temperature source Oral, resp. rate 16, last menstrual period 01/24/2012, SpO2 100.00%.  Physical Exam Gen: NAD Heart: RRR Lungs: CTAB, NWOB Abd: gravid but otherwise soft, nontender to palpation Ext: no appreciable lower extremity edema bilaterally Neuro: grossly nonfocal, speech intact GU:  Dilation: Closed Effacement (%): Thick Exam by:: Philipp Deputy CNM   MAU Course  Procedures  FHR: baseline 140s, moderate variability, + accels, no decels Toco: uterine irritability initially present of ~10second duration, which has since spaced and now toco shows no activity   Assessment and Plan  Rhonda Fischer is a 22 y.o. G1P0000 at [redacted]w[redacted]d who presents after falling down stairs on her back. No direct abdominal trauma. No abdominal pain now. FHR category I. Safe for discharge to home with return precautions (contractions, leaking fluid, bleeding, decreased fetal movement).   Levert Feinstein 07/09/2012, 4:18 AM   I have seen and examined this patient and I agree with the above. Monitored for > 1 hr. Did not strike her abd and denies pain. Ronan, Shantanique Hodo 8:08 AM 07/09/2012

## 2012-07-09 NOTE — ED Provider Notes (Addendum)
History     CSN: [id number]  Arrival date & time 07/09/12  0057   First MD Initiated Contact with Patient 07/09/12 0147      Chief Complaint  Patient presents with  . Fall  . Back Pain    (Consider location/radiation/quality/duration/timing/severity/associated sxs/prior treatment) Patient is a 22 y.o. female presenting with fall and back pain. The history is provided by the patient. No language interpreter was used.  Fall The accident occurred 1 to 2 hours ago. The fall occurred while walking (slid down 7 carpeted stairs on bottom striking posterior pelvis.  Did not hit head no LOC). She landed on carpet. There was no blood loss. Point of impact: posterior right pelvic crest. The pain is moderate. She was ambulatory at the scene. There was no entrapment after the fall. Pertinent negatives include no abdominal pain. Exacerbated by: nothing. She has tried nothing for the symptoms. The treatment provided no relief.  Back Pain Location:  Sacro-iliac joint Radiates to:  Does not radiate Pain severity:  Moderate Pain is:  Same all the time Onset quality:  Sudden Timing:  Constant Progression:  Unchanged Chronicity:  New Context: falling   Worsened by:  Nothing tried Ineffective treatments:  None tried Associated symptoms: no abdominal pain and no pelvic pain     Past Medical History  Diagnosis Date  . GERD (gastroesophageal reflux disease)     Past Surgical History  Procedure Laterality Date  . No past surgeries      No family history on file.  History  Substance Use Topics  . Smoking status: Never Smoker   . Smokeless tobacco: Not on file  . Alcohol Use: No    OB History   Grav Para Term Preterm Abortions TAB SAB Ect Mult Living   1               Review of Systems  Gastrointestinal: Negative for abdominal pain.  Genitourinary: Negative for vaginal bleeding, vaginal discharge, vaginal pain and pelvic pain.  Musculoskeletal: Positive for back pain.  All other  systems reviewed and are negative.    Allergies  Review of patient's allergies indicates no known allergies.  Home Medications   Current Outpatient Rx  Name  Route  Sig  Dispense  Refill  . Doxylamine-Pyridoxine 10-10 MG TBEC   Oral   Take 1 tablet by mouth 4 (four) times daily.   60 tablet   1   . glycopyrrolate (ROBINUL-FORTE) 2 MG tablet   Oral   Take 1 tablet (2 mg total) by mouth 3 (three) times daily.   90 tablet   3   . ondansetron (ZOFRAN ODT) 8 MG disintegrating tablet   Oral   Take 1 tablet (8 mg total) by mouth every 8 (eight) hours as needed for nausea.   20 tablet   0   . Prenatal Multivit-Min-Fe-FA (PRENATAL VITAMINS) 0.8 MG tablet   Oral   Take 1 tablet by mouth daily.   30 tablet   12   . promethazine (PHENERGAN) 25 MG tablet   Oral   Take 1 tablet (25 mg total) by mouth every 6 (six) hours as needed for nausea.   42 tablet   2     BP 97/66  Pulse 78  Temp(Src) 98.4 F (36.9 C) (Oral)  Resp 18  SpO2 100%  LMP 01/24/2012  Physical Exam  Constitutional: She appears well-developed and well-nourished. No distress.  HENT:  Head: Normocephalic and atraumatic. Head is without raccoon's eyes and  without Battle's sign.  Right Ear: No hemotympanum.  Left Ear: No hemotympanum.  Mouth/Throat: Oropharynx is clear and moist.  Eyes: Conjunctivae are normal. Pupils are equal, round, and reactive to light.  Neck: Normal range of motion. Neck supple.  Cardiovascular: Intact distal pulses.   Pulmonary/Chest: Effort normal and breath sounds normal. She has no wheezes. She has no rales.  Abdominal: Soft. Bowel sounds are normal. There is no rebound.  Gravid above the level of the umbilicus  Musculoskeletal: Normal range of motion.  5/5 lower extremity strength L5/s1 intact  Neurological: She is alert. She has normal strength.  No step offs nor crepitance nor point tenderness of the c t o l spine  Skin: Skin is warm and dry.     Psychiatric: She has a  normal mood and affect.    ED Course  Procedures (including critical care time)  Labs Reviewed - No data to display No results found.   No diagnosis found.    MDM  Due to patient's stated decreased fetal movement will transfer to MAU for monitoring  Case d/w Dr. Vicente Serene who will accept patient to the MAU        Shneur Whittenburg K Skarlett Sedlacek-Rasch, MD 07/09/12 0256  Xee Hollman K Toriana Sponsel-Rasch, MD 07/09/12 [phone]

## 2012-07-09 NOTE — ED Notes (Signed)
Pt reports falling backwards down steps tonight, landed on buttocks, c/o lower back pain,

## 2012-07-10 NOTE — MAU Provider Note (Signed)
Attestation of Attending Supervision of Advanced Practitioner (PA/CNM/NP): Evaluation and management procedures were performed by the Advanced Practitioner under my supervision and collaboration.  I have reviewed the Advanced Practitioner's note and chart, and I agree with the management and plan.  Khali Perella, MD, FACOG Attending Obstetrician & Gynecologist Faculty Practice, [facility]  

## 2012-07-31 ENCOUNTER — Encounter: Payer: Self-pay | Admitting: Family Medicine

## 2012-07-31 ENCOUNTER — Encounter: Payer: 59 | Admitting: Obstetrics and Gynecology

## 2012-09-08 ENCOUNTER — Encounter: Payer: Self-pay | Admitting: Obstetrics and Gynecology

## 2012-09-08 ENCOUNTER — Ambulatory Visit (INDEPENDENT_AMBULATORY_CARE_PROVIDER_SITE_OTHER): Payer: Medicaid Other | Admitting: Obstetrics and Gynecology

## 2012-09-08 ENCOUNTER — Encounter: Payer: 59 | Admitting: Family Medicine

## 2012-09-08 VITALS — BP 115/77 | Temp 97.4°F | Wt 139.4 lb

## 2012-09-08 DIAGNOSIS — Z3403 Encounter for supervision of normal first pregnancy, third trimester: Secondary | ICD-10-CM

## 2012-09-08 DIAGNOSIS — Z609 Problem related to social environment, unspecified: Secondary | ICD-10-CM

## 2012-09-08 DIAGNOSIS — O210 Mild hyperemesis gravidarum: Secondary | ICD-10-CM

## 2012-09-08 DIAGNOSIS — Z789 Other specified health status: Secondary | ICD-10-CM

## 2012-09-08 LAB — POCT URINALYSIS DIP (DEVICE)
Ketones, ur: NEGATIVE mg/dL
Leukocytes, UA: NEGATIVE
Protein, ur: 30 mg/dL — AB
pH: 6.5 (ref 5.0–8.0)

## 2012-09-08 NOTE — Progress Notes (Signed)
Pulse: 88

## 2012-09-08 NOTE — Progress Notes (Signed)
Patient doing well without complaints. Has no explanation for missed appointment. Patient does not desire postpartum contraception. Patient unable to stay for 1 hr glucola today. FM/PTL precautions reviewed

## 2012-09-10 ENCOUNTER — Other Ambulatory Visit: Payer: Medicaid Other

## 2012-09-10 DIAGNOSIS — Z3403 Encounter for supervision of normal first pregnancy, third trimester: Secondary | ICD-10-CM

## 2012-09-10 LAB — CBC
HCT: 36.7 % (ref 36.0–46.0)
Hemoglobin: 12.2 g/dL (ref 12.0–15.0)
MCH: 26.1 pg (ref 26.0–34.0)
MCHC: 33.2 g/dL (ref 30.0–36.0)
MCV: 78.6 fL (ref 78.0–100.0)

## 2012-09-11 LAB — HIV ANTIBODY (ROUTINE TESTING W REFLEX): HIV: NONREACTIVE

## 2012-09-12 ENCOUNTER — Encounter: Payer: Self-pay | Admitting: Obstetrics & Gynecology

## 2012-09-22 ENCOUNTER — Ambulatory Visit (INDEPENDENT_AMBULATORY_CARE_PROVIDER_SITE_OTHER): Payer: Medicaid Other | Admitting: Family Medicine

## 2012-09-22 VITALS — BP 105/75 | Temp 99.0°F | Wt 142.0 lb

## 2012-09-22 DIAGNOSIS — O99613 Diseases of the digestive system complicating pregnancy, third trimester: Secondary | ICD-10-CM

## 2012-09-22 DIAGNOSIS — O99891 Other specified diseases and conditions complicating pregnancy: Secondary | ICD-10-CM

## 2012-09-22 DIAGNOSIS — K219 Gastro-esophageal reflux disease without esophagitis: Secondary | ICD-10-CM | POA: Insufficient documentation

## 2012-09-22 LAB — POCT URINALYSIS DIP (DEVICE)
Bilirubin Urine: NEGATIVE
Glucose, UA: 100 mg/dL — AB
Nitrite: NEGATIVE
Urobilinogen, UA: 0.2 mg/dL (ref 0.0–1.0)
pH: 6 (ref 5.0–8.0)

## 2012-09-22 MED ORDER — FAMOTIDINE 20 MG PO TABS: 20.0000 mg | ORAL_TABLET | Freq: Two times a day (BID) | ORAL | Status: DC

## 2012-09-22 NOTE — Progress Notes (Signed)
Heartburn/reflux at night. Will start pepcid.

## 2012-09-22 NOTE — Patient Instructions (Addendum)
Heartburn During Pregnancy   Heartburn is a burning sensation in the chest caused by stomach acid backing up into the esophagus. Heartburn (also known as "reflux") is common in pregnancy because a certain hormone (progesterone) changes. The progesterone hormone may relax the valve that separates the esophagus from the stomach. This allows acid to go up into the esophagus, causing heartburn. Heartburn may also happen in pregnancy because the enlarging uterus pushes up on the stomach, which pushes more acid into the esophagus. This is especially true in the later stages of pregnancy. Heartburn problems usually go away after giving birth.  CAUSES   · The progesterone hormone.  · Changing hormone levels.  · The growing uterus that pushes stomach acid upward.  · Large meals.  · Certain foods and drinks.  · Exercise.  · Increased acid production.  SYMPTOMS   · Burning pain in the chest or lower throat.  · Bitter taste in the mouth.  · Coughing.  DIAGNOSIS   Heartburn is typically diagnosed by your caregiver when taking a careful history of your concern. Your caregiver may order a blood test to check for a certain type of bacteria that is associated with heartburn. Sometimes, heartburn is diagnosed by prescribing a heartburn medicine to see if the symptoms improve. It is rare in pregnancy to have a procedure called an endoscopy. This is when a tube with a light and a camera on the end is used to examine the esophagus and the stomach.  TREATMENT   · Your caregiver may tell you to use certain over-the-counter medicines (antacids, acid reducers) for mild heartburn.  · Your caregiver may prescribe medicines to decrease stomach acid or to protect your stomach lining.  · Your caregiver may recommend certain diet changes.  · For severe cases, your caregiver may recommend that the head of the bed be elevated on blocks. (Sleeping with more pillows is not an effective treatment as it only changes the position of your head and does  not improve the main problem of stomach acid refluxing into the esophagus.)  HOME CARE INSTRUCTIONS   · Take all medicines as directed by your caregiver.  · Raise the head of your bed by putting blocks under the legs if instructed to by your caregiver.  · Do not exercise right after eating.  · Avoid eating 2 or 3 hours before bed. Do not lie down right after eating.  · Eat small meals throughout the day instead of 3 large meals.  · Identify foods and beverages that make your symptoms worse and avoid them. Foods you may want to avoid include:  · Peppers.  · Chocolate.  · High-fat foods, including fried foods.  · Spicy foods.  · Garlic and onions.  · Citrus fruits, including oranges, grapefruit, lemons, and limes.  · Food containing tomatoes or tomato products.  · Mint.  · Carbonated and caffeinated drinks.  · Vinegar.  SEEK IMMEDIATE MEDICAL CARE IF:   · You have severe chest pain that goes down your arm or into your jaw or neck.  · You feel sweaty, dizzy, or lightheaded.  · You become short of breath.  · You vomit blood.  · You have difficulty or pain with swallowing.  · You have bloody or black, tarry stools.  · You have episodes of heartburn more than 3 times a week, for more than 2 weeks.  MAKE SURE YOU:  · Understand these instructions.  · Will watch your condition.  · Will get 

## 2012-09-22 NOTE — Progress Notes (Signed)
Pulse- 94 

## 2012-10-06 ENCOUNTER — Ambulatory Visit (INDEPENDENT_AMBULATORY_CARE_PROVIDER_SITE_OTHER): Payer: Medicaid Other | Admitting: Obstetrics & Gynecology

## 2012-10-06 VITALS — BP 110/72 | Temp 99.1°F | Wt 142.0 lb

## 2012-10-06 DIAGNOSIS — Z3403 Encounter for supervision of normal first pregnancy, third trimester: Secondary | ICD-10-CM

## 2012-10-06 DIAGNOSIS — O99891 Other specified diseases and conditions complicating pregnancy: Secondary | ICD-10-CM

## 2012-10-06 LAB — POCT URINALYSIS DIP (DEVICE)
Bilirubin Urine: NEGATIVE
Glucose, UA: NEGATIVE mg/dL
Nitrite: NEGATIVE
Urobilinogen, UA: 0.2 mg/dL (ref 0.0–1.0)
pH: 6 (ref 5.0–8.0)

## 2012-10-06 LAB — OB RESULTS CONSOLE GBS: GBS: NEGATIVE

## 2012-10-06 NOTE — Addendum Note (Signed)
Addended by: Franchot Mimes on: 10/06/2012 12:24 PM   Modules accepted: Orders

## 2012-10-06 NOTE — Progress Notes (Signed)
Cultures today.  No problems. 

## 2012-10-06 NOTE — Progress Notes (Signed)
Pulse- 94 

## 2012-10-13 ENCOUNTER — Ambulatory Visit (INDEPENDENT_AMBULATORY_CARE_PROVIDER_SITE_OTHER): Payer: Medicaid Other | Admitting: Obstetrics and Gynecology

## 2012-10-13 ENCOUNTER — Encounter: Payer: Self-pay | Admitting: Obstetrics and Gynecology

## 2012-10-13 VITALS — BP 118/78 | Temp 97.7°F | Wt 144.5 lb

## 2012-10-13 DIAGNOSIS — O99891 Other specified diseases and conditions complicating pregnancy: Secondary | ICD-10-CM

## 2012-10-13 DIAGNOSIS — Z789 Other specified health status: Secondary | ICD-10-CM

## 2012-10-13 DIAGNOSIS — Z3403 Encounter for supervision of normal first pregnancy, third trimester: Secondary | ICD-10-CM

## 2012-10-13 DIAGNOSIS — Z609 Problem related to social environment, unspecified: Secondary | ICD-10-CM

## 2012-10-13 DIAGNOSIS — Z23 Encounter for immunization: Secondary | ICD-10-CM

## 2012-10-13 DIAGNOSIS — K219 Gastro-esophageal reflux disease without esophagitis: Secondary | ICD-10-CM

## 2012-10-13 LAB — POCT URINALYSIS DIP (DEVICE)
Bilirubin Urine: NEGATIVE
Glucose, UA: NEGATIVE mg/dL
Hgb urine dipstick: NEGATIVE
Nitrite: NEGATIVE
Specific Gravity, Urine: 1.025 (ref 1.005–1.030)
pH: 7 (ref 5.0–8.0)

## 2012-10-13 MED ORDER — TETANUS-DIPHTH-ACELL PERTUSSIS 5-2.5-18.5 LF-MCG/0.5 IM SUSP
0.5000 mL | Freq: Once | INTRAMUSCULAR | Status: AC
  Administered 2012-10-13: 0.5 mL via INTRAMUSCULAR

## 2012-10-13 NOTE — Progress Notes (Signed)
Patient doing well without complaints. FM/labor precautions reviewed. Tdap today.

## 2012-10-13 NOTE — Addendum Note (Signed)
Addended by: Faythe Casa on: 10/13/2012 11:30 AM   Modules accepted: Orders

## 2012-10-13 NOTE — Progress Notes (Signed)
P=96, here for prenatal visit. States does not speak English, refuses to use [employer]- insists to use husband to interpret who is an interpreter, and has already signed refused interpreter form.

## 2012-10-19 ENCOUNTER — Encounter (HOSPITAL_COMMUNITY): Payer: Self-pay | Admitting: *Deleted

## 2012-10-19 ENCOUNTER — Inpatient Hospital Stay (HOSPITAL_COMMUNITY)
Admission: AD | Admit: 2012-10-19 | Discharge: 2012-10-19 | Disposition: A | Discharge status: Home / Self Care | Payer: Medicaid Other | Source: Ambulatory Visit | Attending: Family Medicine | Admitting: Family Medicine

## 2012-10-19 DIAGNOSIS — R109 Unspecified abdominal pain: Secondary | ICD-10-CM | POA: Insufficient documentation

## 2012-10-19 DIAGNOSIS — O99891 Other specified diseases and conditions complicating pregnancy: Secondary | ICD-10-CM | POA: Insufficient documentation

## 2012-10-19 NOTE — MAU Note (Signed)
Pt states she has pain that comes and goes in her abd and the baby is sitting on 1 side-note pt speaks [place] and her husband intreprets for her

## 2012-10-21 ENCOUNTER — Encounter (HOSPITAL_COMMUNITY): Payer: Self-pay | Admitting: *Deleted

## 2012-10-21 ENCOUNTER — Inpatient Hospital Stay (HOSPITAL_COMMUNITY)
Admission: AD | Admit: 2012-10-21 | Discharge: 2012-10-21 | Disposition: A | Discharge status: Home / Self Care | Payer: Medicaid Other | Source: Ambulatory Visit | Attending: Obstetrics & Gynecology | Admitting: Obstetrics & Gynecology

## 2012-10-21 DIAGNOSIS — O471 False labor at or after 37 completed weeks of gestation: Secondary | ICD-10-CM

## 2012-10-21 DIAGNOSIS — R109 Unspecified abdominal pain: Secondary | ICD-10-CM | POA: Insufficient documentation

## 2012-10-21 DIAGNOSIS — O4690 Antepartum hemorrhage, unspecified, unspecified trimester: Secondary | ICD-10-CM | POA: Insufficient documentation

## 2012-10-21 DIAGNOSIS — O479 False labor, unspecified: Secondary | ICD-10-CM | POA: Insufficient documentation

## 2012-10-21 NOTE — MAU Note (Signed)
States bleeding started this morning. States she has been having some pain, but is worse this morning. Pain in lower abdomen and lower back. ? Contractions.

## 2012-10-21 NOTE — MAU Note (Signed)
C/O bleeding this a.m., back pain.  No LOF.

## 2012-10-21 NOTE — MAU Provider Note (Signed)
  History     CSN: [id number]  Arrival date and time: 10/21/12 0810   None     Chief Complaint  Patient presents with  . Vaginal Bleeding  . Back Pain   HPI 22 y.o. G1P0000 at [redacted]w[redacted]d presents for back pain and vaginal bleeding. Pain in lower back and lower abdominal, describes as contractional pain, occuring every 8 minutes. Has had some bleeding this morning, woke up with a 4" diameter blood/mucous stain on bed. Denies gush of fluid. +FM. Denies headache, dizziness, changes in vision, chest pain, shortness of breath, nausea/vomiting, fevers/chills.  Prenatal course: Care at [facility] - Declined genetic screen - Normal anatomy - GTT 102  OB History   Grav Para Term Preterm Abortions TAB SAB Ect Mult Living   1 0 0 0 0 0 0 0 0 0       Past Medical History  Diagnosis Date  . GERD (gastroesophageal reflux disease)     Past Surgical History  Procedure Laterality Date  . No past surgeries      History reviewed. No pertinent family history.  History  Substance Use Topics  . Smoking status: Never Smoker   . Smokeless tobacco: Not on file  . Alcohol Use: No    Allergies: No Known Allergies  Prescriptions prior to admission  Medication Sig Dispense Refill  . famotidine (PEPCID) 20 MG tablet Take 1 tablet (20 mg total) by mouth 2 (two) times daily.  60 tablet  2  . Prenatal Multivit-Min-Fe-FA (PRENATAL VITAMINS) 0.8 MG tablet Take 1 tablet by mouth daily.  30 tablet  12    ROS negative except as above Physical Exam   Blood pressure 110/77, pulse 93, temperature 99.3 F (37.4 C), temperature source Oral, resp. rate 16, height 5\' 6"  (1.676 m), weight 65.681 kg (144 lb 12.8 oz), last menstrual period 01/24/2012.  Physical Exam General appearance: alert, cooperative and no distress Head: Normocephalic, without obvious abnormality, atraumatic Lungs: clear to auscultation bilaterally Heart: regular rate and rhythm, S1, S2 normal, no murmur, click, rub or gallop Abdomen:  gravid, nontender to palpation, fundal height consistent with GA Extremities: no edema, redness or tenderness in the calves or thighs Pulses: 2+ and symmetric PT Skin: warm and dry  Dilation: 1 Effacement (%): 50 Cervical Position: Posterior Station: -3 Exam by:: Dr. Waynetta Sandy Bloody show  Cervical exam done with nurse present.  FHT: 145bpm, mod var, 15x15 accels present, 3 small variables when first on monitor but now reassuring Toco: q5-33min  MAU Course  Procedures  MDM   Assessment and Plan  22 y.o. G1P0000 at [redacted]w[redacted]d   No cervical change since 8/3 FHT reassuring Labor precautions given Appointment on 8/8, told not to hesitate to come back to MAU if contractions get closer, bleeding gets heavier. Stable for discharge  Tawni Carnes 10/21/2012, 9:19 AM   Evaluation and management procedures were performed by Resident physician under my supervision/collaboration. Chart reviewed, patient examined by me and I agree with management and plan. Cat 1 FHR. Danae Orleans, CNM 10/21/2012 10:24 AM

## 2012-10-22 ENCOUNTER — Inpatient Hospital Stay (HOSPITAL_COMMUNITY)
Admission: AD | Admit: 2012-10-22 | Discharge: 2012-10-24 | DRG: 775 | Disposition: A | Discharge status: Home / Self Care | Payer: Medicaid Other | Source: Ambulatory Visit | Attending: Obstetrics & Gynecology | Admitting: Obstetrics & Gynecology

## 2012-10-22 ENCOUNTER — Encounter (HOSPITAL_COMMUNITY): Payer: Self-pay | Admitting: *Deleted

## 2012-10-22 DIAGNOSIS — Z789 Other specified health status: Secondary | ICD-10-CM

## 2012-10-22 DIAGNOSIS — O210 Mild hyperemesis gravidarum: Secondary | ICD-10-CM

## 2012-10-22 DIAGNOSIS — Z603 Acculturation difficulty: Secondary | ICD-10-CM

## 2012-10-22 DIAGNOSIS — O4290 Premature rupture of membranes, unspecified as to length of time between rupture and onset of labor, unspecified weeks of gestation: Secondary | ICD-10-CM

## 2012-10-22 DIAGNOSIS — Z3402 Encounter for supervision of normal first pregnancy, second trimester: Secondary | ICD-10-CM

## 2012-10-22 LAB — AMNISURE RUPTURE OF MEMBRANE (ROM) NOT AT ARMC: Amnisure ROM: POSITIVE

## 2012-10-22 LAB — CBC
HCT: 39.4 % (ref 36.0–46.0)
Hemoglobin: 13.3 g/dL (ref 12.0–15.0)
MCHC: 33.8 g/dL (ref 30.0–36.0)
MCV: 79.4 fL (ref 78.0–100.0)

## 2012-10-22 MED ORDER — DIPHENHYDRAMINE HCL 50 MG/ML IJ SOLN: 12.5000 mg | INTRAMUSCULAR | Status: DC | PRN

## 2012-10-22 MED ORDER — FENTANYL 2.5 MCG/ML BUPIVACAINE 1/10 % EPIDURAL INFUSION (WH - ANES)
14.0000 mL/h | INTRAMUSCULAR | Status: DC | PRN
  Filled 2012-10-22: qty 125

## 2012-10-22 MED ORDER — PHENYLEPHRINE 40 MCG/ML (10ML) SYRINGE FOR IV PUSH (FOR BLOOD PRESSURE SUPPORT)
80.0000 ug | PREFILLED_SYRINGE | INTRAVENOUS | Status: DC | PRN
  Filled 2012-10-22: qty 2

## 2012-10-22 MED ORDER — LACTATED RINGERS IV SOLN
500.0000 mL | Freq: Once | INTRAVENOUS | Status: AC
  Administered 2012-10-23: 1000 mL via INTRAVENOUS

## 2012-10-22 MED ORDER — PHENYLEPHRINE 40 MCG/ML (10ML) SYRINGE FOR IV PUSH (FOR BLOOD PRESSURE SUPPORT)
80.0000 ug | PREFILLED_SYRINGE | INTRAVENOUS | Status: DC | PRN
  Filled 2012-10-22: qty 2
  Filled 2012-10-22: qty 5

## 2012-10-22 MED ORDER — ACETAMINOPHEN 325 MG PO TABS: 650.0000 mg | ORAL_TABLET | ORAL | Status: DC | PRN

## 2012-10-22 MED ORDER — EPHEDRINE 5 MG/ML INJ
10.0000 mg | INTRAVENOUS | Status: DC | PRN
  Filled 2012-10-22: qty 4
  Filled 2012-10-22: qty 2

## 2012-10-22 MED ORDER — EPHEDRINE 5 MG/ML INJ
10.0000 mg | INTRAVENOUS | Status: DC | PRN
  Filled 2012-10-22: qty 2

## 2012-10-22 MED ORDER — ONDANSETRON HCL 4 MG/2ML IJ SOLN: 4.0000 mg | Freq: Four times a day (QID) | INTRAMUSCULAR | Status: DC | PRN

## 2012-10-22 MED ORDER — OXYCODONE-ACETAMINOPHEN 5-325 MG PO TABS: 1.0000 | ORAL_TABLET | ORAL | Status: DC | PRN

## 2012-10-22 MED ORDER — LACTATED RINGERS IV SOLN
500.0000 mL | INTRAVENOUS | Status: DC | PRN
  Administered 2012-10-23: 1000 mL via INTRAVENOUS

## 2012-10-22 MED ORDER — OXYTOCIN 40 UNITS IN LACTATED RINGERS INFUSION - SIMPLE MED: 62.5000 mL/h | INTRAVENOUS | Status: DC

## 2012-10-22 MED ORDER — FENTANYL CITRATE 0.05 MG/ML IJ SOLN
100.0000 ug | INTRAMUSCULAR | Status: DC | PRN
  Administered 2012-10-22 (×2): 100 ug via INTRAVENOUS
  Filled 2012-10-22: qty 2

## 2012-10-22 MED ORDER — OXYTOCIN BOLUS FROM INFUSION: 500.0000 mL | INTRAVENOUS | Status: DC

## 2012-10-22 MED ORDER — OXYTOCIN 40 UNITS IN LACTATED RINGERS INFUSION - SIMPLE MED
1.0000 m[IU]/min | INTRAVENOUS | Status: DC
  Administered 2012-10-22: 2 m[IU]/min via INTRAVENOUS
  Filled 2012-10-22: qty 1000

## 2012-10-22 MED ORDER — IBUPROFEN 600 MG PO TABS: 600.0000 mg | ORAL_TABLET | Freq: Four times a day (QID) | ORAL | Status: DC | PRN

## 2012-10-22 MED ORDER — LACTATED RINGERS IV SOLN
INTRAVENOUS | Status: DC
  Administered 2012-10-22: 21:00:00 via INTRAVENOUS

## 2012-10-22 MED ORDER — CITRIC ACID-SODIUM CITRATE 334-500 MG/5ML PO SOLN: 30.0000 mL | ORAL | Status: DC | PRN

## 2012-10-22 MED ORDER — LIDOCAINE HCL (PF) 1 % IJ SOLN
30.0000 mL | INTRAMUSCULAR | Status: DC | PRN
  Filled 2012-10-22: qty 30

## 2012-10-22 MED ORDER — FENTANYL CITRATE 0.05 MG/ML IJ SOLN
INTRAMUSCULAR | Status: AC
  Filled 2012-10-22: qty 2

## 2012-10-22 MED ORDER — TERBUTALINE SULFATE 1 MG/ML IJ SOLN: 0.2500 mg | Freq: Once | INTRAMUSCULAR | Status: AC | PRN

## 2012-10-22 MED ORDER — OXYCODONE-ACETAMINOPHEN 5-325 MG PO TABS
1.0000 | ORAL_TABLET | Freq: Once | ORAL | Status: AC
  Administered 2012-10-22: 1 via ORAL
  Filled 2012-10-22: qty 1

## 2012-10-22 NOTE — Progress Notes (Signed)
Dr Reola Calkins notified of pt's amnisure results positive, FHR pattern, contraction pattern, orders received to admit pt.

## 2012-10-22 NOTE — Progress Notes (Signed)
Nneoma Harral is a 22 y.o. G1P0000 at [redacted]w[redacted]d  admitted for rupture of membranes  Subjective:  Hurting significantly with contractions. +FM. No LOF, VB.  Objective: BP 126/103  Pulse 91  Temp(Src) 98.2 F (36.8 C) (Oral)  Resp 20  LMP 01/24/2012      FHT:  FHR: 130 bpm, variability: moderate,  accelerations:  Present,  decelerations:  Absent UC:   Irregular every 3-6 min SVE:   Dilation: 4 Effacement (%): 90 Station: 0 Exam by:: MD  Labs: Lab Results  Component Value Date   WBC 12.7* 10/22/2012   HGB 13.3 10/22/2012   HCT 39.4 10/22/2012   MCV 79.4 10/22/2012   PLT 142* 10/22/2012    Assessment / Plan: augmentation of labor due to SROM  Labor: Progressing on Pitocin, will continue to increase then AROM Fetal Wellbeing:  Category I Pain Control:  Fentanyl I/D:  n/a Anticipated MOD:  NSVD  Fendi Meinhardt L 10/22/2012, 11:26 PM

## 2012-10-22 NOTE — Progress Notes (Signed)
pts husband back to room. Discussed options of epidural vs IV pain med. After discussion will recheck pt at midnight and decide on IV vs epidural

## 2012-10-22 NOTE — Progress Notes (Addendum)
Fetal monitors applied and assessing. Waiting to do admission and all questions because pt speaks french [place] and refuses interpreter-wants to wait until husband arrives for him interpret.

## 2012-10-22 NOTE — MAU Note (Signed)
Pt presents with complaints of contractions on and off all week that got stronger this morning. States some white discharge but denies any bleeding. States baby is active.

## 2012-10-22 NOTE — MAU Provider Note (Signed)
  History     CSN: [id number]  Arrival date and time: 10/22/12 1346   None     Chief Complaint  Patient presents with  . Labor Eval   HPI 22 y.o. G1P0000 at [redacted]w[redacted]d presents to MAU for labor eval. Was seen yesterday for contractions, at that time they were 8 minutes apart. Today around 6am the contractions got more intense and painful, now says they are every 3 minutes. The pain at this time is bringing her to tears, patient obviously in distress with the contractions during interview. Denies big gush of fluid but has had moderate amount of white/clear discharge starting last night. Upon re-exam several hours later the patient explains more that she has been leaking fluid while in the MAU. Denies chest pain, shortness of breath, headache, dizziness, changes in vision.  Prenatal course: Care at [facility]  - Declined genetic screen  - Normal anatomy  - GTT 102  OB History   Grav Para Term Preterm Abortions TAB SAB Ect Mult Living   1 0 0 0 0 0 0 0 0 0       Past Medical History  Diagnosis Date  . GERD (gastroesophageal reflux disease)     Past Surgical History  Procedure Laterality Date  . No past surgeries      History reviewed. No pertinent family history.  History  Substance Use Topics  . Smoking status: Never Smoker   . Smokeless tobacco: Not on file  . Alcohol Use: No    Allergies: No Known Allergies  Prescriptions prior to admission  Medication Sig Dispense Refill  . famotidine (PEPCID) 20 MG tablet Take 1 tablet (20 mg total) by mouth 2 (two) times daily.  60 tablet  2  . Prenatal Multivit-Min-Fe-FA (PRENATAL VITAMINS) 0.8 MG tablet Take 1 tablet by mouth daily.  30 tablet  12    ROS negative except as above Physical Exam   Blood pressure 126/88, pulse 89, temperature 98.7 F (37.1 C), temperature source Oral, resp. rate 16, last menstrual period 01/24/2012.  Physical Exam General appearance: alert, cooperative and no distress Lungs: clear to auscultation  bilaterally Heart: regular rate and rhythm, S1, S2 normal, no murmur, click, rub or gallop Abdomen: gravid, nontender to palpation Extremities: no edema, redness or tenderness in the calves or thighs Pulses: 2+ and symmetric DP and PT Skin: warm and dry  Dilation: 1.5 Effacement (%): 80 Cervical Position: Middle Exam by:: Ginger Morris RN   FHT: 135bpm, mod var with periods of minimal var, 15x15 accels, no decels. Periods of minimal variability Toco: contractions q2-63min  MAU Course  Procedures Results for orders placed during the hospital encounter of 10/22/12 (from the past 24 hour(s))  AMNISURE RUPTURE OF MEMBRANE (ROM)     Status: None   Collection Time    10/22/12  5:00 PM      Result Value Range   Amnisure ROM POSITIVE     MDM 1x percocet while in MAU  Assessment and Plan  22 y.o. G1P0000 at [redacted]w[redacted]d   Amnisure positive FHT periods of minimal variability, but overall reassuring GBS negative Admit to L&D, normal orders Expectant management for SVD  See H&P for further documentation  Tawni Carnes 10/22/2012, 2:26 PM   I have seen and examined this patient and agree with above documentation in the resident's note. Please see H&P for further details.   Rulon Abide, M.D. [facility] Fellow 10/22/2012 7:27 PM

## 2012-10-22 NOTE — H&P (Signed)
Rhonda Fischer is a 22 y.o. female presenting for contractions and rupture of membrane. Maternal Medical History:  Reason for admission: Rupture of membranes.  Nausea.  Contractions: Onset was 13-24 hours ago.   Frequency: regular.   Duration is approximately 1 minute.   Perceived severity is strong.    Fetal activity: Perceived fetal activity is normal.   Last perceived fetal movement was within the past hour.    Prenatal complications: no prenatal complications Prenatal Complications - Diabetes: none.   HPI 22 y.o. G1P0000 at [redacted]w[redacted]d presents to MAU for labor eval. Was seen yesterday for contractions, at that time they were 8 minutes apart. Today around 6am the contractions got more intense and painful, now says they are every 3 minutes. The pain at this time is bringing her to tears, patient obviously in distress with the contractions during interview. Denies big gush of fluid but has had moderate amount of white/clear discharge starting last night. Upon re-exam several hours later the patient explains more that she has been leaking fluid while in the MAU. Denies chest pain, shortness of breath, headache, dizziness, changes in vision.  Prenatal course: Care at [facility]  - Declined genetic screen  - Normal anatomy  - GTT 102 OB History   Grav Para Term Preterm Abortions TAB SAB Ect Mult Living   1 0 0 0 0 0 0 0 0 0      Past Medical History  Diagnosis Date  . GERD (gastroesophageal reflux disease)    Past Surgical History  Procedure Laterality Date  . No past surgeries     Family History: family history is not on file. Social History:  reports that she has never smoked. She does not have any smokeless tobacco history on file. She reports that she does not drink alcohol or use illicit drugs.   Prenatal Transfer Tool  Maternal Diabetes: No Genetic Screening: Declined Maternal Ultrasounds/Referrals: Normal Fetal Ultrasounds or other Referrals:  None Maternal Substance Abuse:   No Significant Maternal Medications:  None Significant Maternal Lab Results:  Lab values include: Group B Strep negative Other Comments:  None  Review of Systems  Constitutional: Negative for fever.  Eyes: Negative for blurred vision and double vision.  Respiratory: Negative for shortness of breath.   Cardiovascular: Negative for chest pain.  Gastrointestinal: Negative for heartburn, nausea and vomiting.  Genitourinary: Negative for dysuria.  Neurological: Negative for dizziness and headaches.    Dilation: 1.5 Effacement (%): 80 Exam by:: Ginger Morris RN Blood pressure 126/88, pulse 89, temperature 98.7 F (37.1 C), temperature source Oral, resp. rate 16, last menstrual period 01/24/2012. Maternal Exam:  Uterine Assessment: Contraction strength is moderate.  Contraction duration is 1 minute. Contraction frequency is regular.   Abdomen: Fetal presentation: vertex  Cervix: Cervix evaluated by digital exam.     Fetal Exam Fetal Monitor Review: Baseline rate: 135.  Variability: moderate (6-25 bpm).   Pattern: accelerations present and no decelerations.    Fetal State Assessment: Category I - tracings are normal.     Results for orders placed during the hospital encounter of 10/22/12 (from the past 24 hour(s))  AMNISURE RUPTURE OF MEMBRANE (ROM)     Status: None   Collection Time    10/22/12  5:00 PM      Result Value Range   Amnisure ROM POSITIVE      Physical Exam  Constitutional: She is oriented to person, place, and time. She appears well-developed and well-nourished.  HENT:  Head: Normocephalic and atraumatic.  Cardiovascular: Normal rate, regular rhythm, normal heart sounds and intact distal pulses.  Exam reveals no gallop and no friction rub.   No murmur heard. Respiratory: Effort normal and breath sounds normal.  GI: There is no tenderness.  Neurological: She is alert and oriented to person, place, and time.    Prenatal labs: ABO, Rh: B/POS/-- (01/23  1349) Antibody: NEG (01/23 1349) Rubella: 29.90 (01/23 1349) RPR: NON REAC (06/25 1619)  HBsAg: NEGATIVE (01/23 1349)  HIV: NON REACTIVE (06/25 1619)  GBS: Negative (07/21 0000)   Assessment/Plan: 22 y.o. G1P0000 at [redacted]w[redacted]d    Positive amnisure 2x cervical checks in MAU unchanged FHT periods of minimal variability, overall reassuring category I GBS negative Normal L&D orders Expectant management for SVD  Tawni Carnes 10/22/2012, 5:42 PM   I have seen and examined this patient and agree with above documentation in the resident's note.  Pt presented for r/o labor and then with story for SROM. Amnisure done as story was inconsistent and was positive.  Pt with unchanged SVE in MAU.  Admit and start augmentation based on check.   FWB: overall category I. One prolonged decel in MAU @ 1800 for 4 min.  None since then and has had + accels and great variability.    Rulon Abide, M.D. [facility] Fellow 10/22/2012 7:30 PM

## 2012-10-23 ENCOUNTER — Encounter (HOSPITAL_COMMUNITY): Payer: Self-pay | Admitting: Anesthesiology

## 2012-10-23 ENCOUNTER — Inpatient Hospital Stay (HOSPITAL_COMMUNITY): Payer: Medicaid Other | Admitting: Anesthesiology

## 2012-10-23 LAB — CBC
MCH: 26.6 pg (ref 26.0–34.0)
MCV: 80.3 fL (ref 78.0–100.0)
Platelets: 131 10*3/uL — ABNORMAL LOW (ref 150–400)
RBC: 5.19 MIL/uL — ABNORMAL HIGH (ref 3.87–5.11)
RDW: 15.1 % (ref 11.5–15.5)

## 2012-10-23 MED ORDER — LIDOCAINE HCL (PF) 1 % IJ SOLN
30.0000 mL | INTRAMUSCULAR | Status: DC | PRN
  Filled 2012-10-23: qty 30

## 2012-10-23 MED ORDER — CITRIC ACID-SODIUM CITRATE 334-500 MG/5ML PO SOLN: 30.0000 mL | ORAL | Status: DC | PRN

## 2012-10-23 MED ORDER — ONDANSETRON HCL 4 MG PO TABS: 4.0000 mg | ORAL_TABLET | ORAL | Status: DC | PRN

## 2012-10-23 MED ORDER — DIBUCAINE 1 % RE OINT: 1.0000 "application " | TOPICAL_OINTMENT | RECTAL | Status: DC | PRN

## 2012-10-23 MED ORDER — LIDOCAINE HCL (PF) 1 % IJ SOLN
INTRAMUSCULAR | Status: DC | PRN
  Administered 2012-10-23 (×2): 9 mL

## 2012-10-23 MED ORDER — OXYCODONE-ACETAMINOPHEN 5-325 MG PO TABS
1.0000 | ORAL_TABLET | ORAL | Status: DC | PRN
  Filled 2012-10-23: qty 1

## 2012-10-23 MED ORDER — PRENATAL MULTIVITAMIN CH
1.0000 | ORAL_TABLET | Freq: Every day | ORAL | Status: DC
  Administered 2012-10-23 – 2012-10-24 (×2): 1 via ORAL
  Filled 2012-10-23 (×2): qty 1

## 2012-10-23 MED ORDER — FLEET ENEMA 7-19 GM/118ML RE ENEM: 1.0000 | ENEMA | RECTAL | Status: DC | PRN

## 2012-10-23 MED ORDER — DIPHENHYDRAMINE HCL 25 MG PO CAPS: 25.0000 mg | ORAL_CAPSULE | Freq: Four times a day (QID) | ORAL | Status: DC | PRN

## 2012-10-23 MED ORDER — WITCH HAZEL-GLYCERIN EX PADS: 1.0000 "application " | MEDICATED_PAD | CUTANEOUS | Status: DC | PRN

## 2012-10-23 MED ORDER — OXYTOCIN 40 UNITS IN LACTATED RINGERS INFUSION - SIMPLE MED: 62.5000 mL/h | INTRAVENOUS | Status: DC

## 2012-10-23 MED ORDER — SENNOSIDES-DOCUSATE SODIUM 8.6-50 MG PO TABS
2.0000 | ORAL_TABLET | Freq: Every day | ORAL | Status: DC
  Administered 2012-10-23: 2 via ORAL

## 2012-10-23 MED ORDER — ACETAMINOPHEN 325 MG PO TABS: 650.0000 mg | ORAL_TABLET | ORAL | Status: DC | PRN

## 2012-10-23 MED ORDER — IBUPROFEN 600 MG PO TABS
600.0000 mg | ORAL_TABLET | Freq: Four times a day (QID) | ORAL | Status: DC | PRN
  Filled 2012-10-23 (×5): qty 1

## 2012-10-23 MED ORDER — ZOLPIDEM TARTRATE 5 MG PO TABS: 5.0000 mg | ORAL_TABLET | Freq: Every evening | ORAL | Status: DC | PRN

## 2012-10-23 MED ORDER — OXYTOCIN BOLUS FROM INFUSION
500.0000 mL | INTRAVENOUS | Status: DC
  Administered 2012-10-23: 500 mL via INTRAVENOUS

## 2012-10-23 MED ORDER — SIMETHICONE 80 MG PO CHEW: 80.0000 mg | CHEWABLE_TABLET | ORAL | Status: DC | PRN

## 2012-10-23 MED ORDER — ONDANSETRON HCL 4 MG/2ML IJ SOLN: 4.0000 mg | INTRAMUSCULAR | Status: DC | PRN

## 2012-10-23 MED ORDER — LACTATED RINGERS IV SOLN: 500.0000 mL | INTRAVENOUS | Status: DC | PRN

## 2012-10-23 MED ORDER — MEASLES, MUMPS & RUBELLA VAC ~~LOC~~ INJ: 0.5000 mL | INJECTION | Freq: Once | SUBCUTANEOUS | Status: DC

## 2012-10-23 MED ORDER — LANOLIN HYDROUS EX OINT: TOPICAL_OINTMENT | CUTANEOUS | Status: DC | PRN

## 2012-10-23 MED ORDER — TETANUS-DIPHTH-ACELL PERTUSSIS 5-2.5-18.5 LF-MCG/0.5 IM SUSP: 0.5000 mL | Freq: Once | INTRAMUSCULAR | Status: DC

## 2012-10-23 MED ORDER — IBUPROFEN 600 MG PO TABS
600.0000 mg | ORAL_TABLET | Freq: Four times a day (QID) | ORAL | Status: DC
  Administered 2012-10-23 – 2012-10-24 (×6): 600 mg via ORAL
  Filled 2012-10-23: qty 1

## 2012-10-23 MED ORDER — OXYCODONE-ACETAMINOPHEN 5-325 MG PO TABS
1.0000 | ORAL_TABLET | ORAL | Status: DC | PRN
  Administered 2012-10-23 – 2012-10-24 (×2): 1 via ORAL
  Filled 2012-10-23: qty 1

## 2012-10-23 MED ORDER — BENZOCAINE-MENTHOL 20-0.5 % EX AERO: 1.0000 "application " | INHALATION_SPRAY | CUTANEOUS | Status: DC | PRN

## 2012-10-23 MED ORDER — LACTATED RINGERS IV SOLN: INTRAVENOUS | Status: DC

## 2012-10-23 MED ORDER — ONDANSETRON HCL 4 MG/2ML IJ SOLN: 4.0000 mg | Freq: Four times a day (QID) | INTRAMUSCULAR | Status: DC | PRN

## 2012-10-23 MED ORDER — FENTANYL 2.5 MCG/ML BUPIVACAINE 1/10 % EPIDURAL INFUSION (WH - ANES)
INTRAMUSCULAR | Status: DC | PRN
  Administered 2012-10-23: 14 mL/h via EPIDURAL

## 2012-10-23 NOTE — Anesthesia Postprocedure Evaluation (Signed)
  Anesthesia Post-op Note  Patient: Rhonda Fischer  Procedure(s) Performed: * No procedures listed *  Patient Location: Mother/Baby  Anesthesia Type:Epidural  Level of Consciousness: awake, alert , oriented and patient cooperative  Airway and Oxygen Therapy: Patient Spontanous Breathing  Post-op Pain: mild  Post-op Assessment: Patient's Cardiovascular Status Stable, Respiratory Function Stable, No headache, No backache, No residual numbness and No residual motor weakness  Post-op Vital Signs: stable  Complications: No apparent anesthesia complications

## 2012-10-23 NOTE — Anesthesia Procedure Notes (Signed)
Epidural Patient location during procedure: OB Start time: 10/23/2012 12:22 AM End time: 10/23/2012 12:26 AM  Staffing Anesthesiologist: Sandrea Hughs Performed by: anesthesiologist   Preanesthetic Checklist Completed: patient identified, surgical consent, pre-op evaluation, timeout performed, IV checked, risks and benefits discussed and monitors and equipment checked  Epidural Patient position: sitting Prep: site prepped and draped and DuraPrep Patient monitoring: continuous pulse ox and blood pressure Approach: midline Injection technique: LOR air  Needle:  Needle type: Tuohy  Needle gauge: 17 G Needle length: 9 cm and 9 Needle insertion depth: 4 cm Catheter type: closed end flexible Catheter size: 19 Gauge Catheter at skin depth: 8 cm Test dose: negative and Other  Assessment Sensory level: T10 Events: blood not aspirated, injection not painful, no injection resistance, negative IV test and no paresthesia  Additional Notes Reason for block:procedure for pain

## 2012-10-23 NOTE — Progress Notes (Signed)
UR chart review completed.  

## 2012-10-23 NOTE — Anesthesia Preprocedure Evaluation (Signed)
Anesthesia Evaluation  Patient identified by MRN, date of birth, ID band Patient awake    Reviewed: Allergy & Precautions, H&P , NPO status , Patient's Chart, lab work & pertinent test results  Airway Mallampati: I TM Distance: >3 FB Neck ROM: full    Dental no notable dental hx.    Pulmonary neg pulmonary ROS,    Pulmonary exam normal       Cardiovascular negative cardio ROS      Neuro/Psych negative neurological ROS  negative psych ROS   GI/Hepatic Neg liver ROS,   Endo/Other  negative endocrine ROS  Renal/GU negative Renal ROS  negative genitourinary   Musculoskeletal negative musculoskeletal ROS (+)   Abdominal Normal abdominal exam  (+)   Peds  Hematology negative hematology ROS (+)   Anesthesia Other Findings   Reproductive/Obstetrics (+) Pregnancy                           Anesthesia Physical Anesthesia Plan  ASA: II  Anesthesia Plan: Epidural   Post-op Pain Management:    Induction:   Airway Management Planned:   Additional Equipment:   Intra-op Plan:   Post-operative Plan:   Informed Consent: I have reviewed the patients History and Physical, chart, labs and discussed the procedure including the risks, benefits and alternatives for the proposed anesthesia with the patient or authorized representative who has indicated his/her understanding and acceptance.     Plan Discussed with:   Anesthesia Plan Comments:         Anesthesia Quick Evaluation

## 2012-10-24 ENCOUNTER — Encounter: Payer: 59 | Admitting: Advanced Practice Midwife

## 2012-10-24 MED ORDER — IBUPROFEN 600 MG PO TABS: 600.0000 mg | ORAL_TABLET | Freq: Four times a day (QID) | ORAL | Status: DC

## 2012-10-24 NOTE — Discharge Summary (Signed)
Obstetric Discharge Summary Reason for Admission: onset of labor and rupture of membranes Prenatal Procedures: none Intrapartum Procedures: spontaneous vaginal delivery Postpartum Procedures: none Complications-Operative and Postpartum: none  Breast feeding. Undecided on contraception.  Hemoglobin  Date Value Range Status  10/23/2012 13.8  12.0 - 15.0 g/dL Final     HCT  Date Value Range Status  10/23/2012 41.7  36.0 - 46.0 % Final    Physical Exam:  General: alert, cooperative and no distress Lochia: appropriate Uterine Fundus: firm DVT Evaluation: No evidence of DVT seen on physical exam. No cords or calf tenderness. No significant calf/ankle edema.  Discharge Diagnoses: Term Pregnancy-delivered  Discharge Information: Date: 10/24/2012 Activity: pelvic rest Diet: routine Medications: PNV and Ibuprofen Condition: stable Instructions: refer to practice specific booklet Discharge to: home Follow-up Information   Follow up with [facility]. Schedule an appointment as soon as possible for a visit in 4 weeks. (Call and schedule an appointment for 4-6 weeks from now)    Contact information:   [address] [place] [address] [phone]      Newborn Data: Live born female  Birth Weight: 6 lb 7.5 oz (2935 g) APGAR: 1, 6  Home with mother.  Patient doing well and requested an early discharge today. Patient's sister used as interpreter, patient declined using phone interpreter. Per sister, patient verbalized understanding of discharge instructions.  Rhonda Fischer 10/24/2012, 2:33 PM  I have discussed this patient and agree with above documentation in the resident's note. The pt was physically examined this AM by Philipp Deputy, CNM. Please see her note for details.   Rulon Abide, M.D. [facility] Fellow 10/24/2012 3:38 PM

## 2012-10-24 NOTE — Progress Notes (Signed)
Post Partum Day #1 Subjective: no complaints, up ad lib and tolerating PO; breastfeeding going well; undecided re contraception  Objective: Blood pressure 109/74, pulse 77, temperature 98.9 F (37.2 C), temperature source Oral, resp. rate 17, height 5\' 8"  (1.727 m), weight 65.318 kg (144 lb), last menstrual period 01/24/2012, SpO2 99.00%, unknown if currently breastfeeding.  Physical Exam:  General: alert, cooperative and no distress Heart: RRR Lungs: nl effort Lochia: appropriate Uterine Fundus: firm DVT Evaluation: No evidence of DVT seen on physical exam.   Recent Labs  10/22/12 1745 10/23/12 0400  HGB 13.3 13.8  HCT 39.4 41.7    Assessment/Plan: Plan for discharge tomorrow Rev'd types of contraception- still unsure   LOS: 2 days   Jason Hauge 10/24/2012, 7:30 AM

## 2012-10-27 ENCOUNTER — Encounter: Payer: Self-pay | Admitting: *Deleted

## 2012-10-27 NOTE — MAU Provider Note (Signed)
Attestation of Attending Supervision of Advanced Practitioner (CNM/NP): Evaluation and management procedures were performed by the Advanced Practitioner under my supervision and collaboration. I have reviewed the Advanced Practitioner's note and chart, and I agree with the management and plan.  Rhonda Fischer H. 9:40 PM

## 2012-10-27 NOTE — Discharge Summary (Signed)
Attestation of Attending Supervision of Advanced Practitioner (CNM/NP): Evaluation and management procedures were performed by the Advanced Practitioner under my supervision and collaboration.  I have reviewed the Advanced Practitioner's note and chart, and I agree with the management and plan.  Misti Towle 10/27/2012 8:46 AM

## 2012-10-28 ENCOUNTER — Encounter: Payer: Self-pay | Admitting: Advanced Practice Midwife

## 2012-11-18 ENCOUNTER — Encounter: Payer: Self-pay | Admitting: *Deleted

## 2012-11-28 ENCOUNTER — Ambulatory Visit (INDEPENDENT_AMBULATORY_CARE_PROVIDER_SITE_OTHER): Payer: 59 | Admitting: Advanced Practice Midwife

## 2012-11-28 NOTE — Patient Instructions (Signed)
Medroxyprogesterone injection [Contraceptive] What is this medicine? MEDROXYPROGESTERONE (me DROX ee proe JES te rone) contraceptive injections prevent pregnancy. They provide effective birth control for 3 months. Depo-subQ Provera 104 is also used for treating pain related to endometriosis. This medicine may be used for other purposes; ask your health care provider or pharmacist if you have questions. What should I tell my health care provider before I take this medicine? They need to know if you have any of these conditions: -frequently drink alcohol -asthma -blood vessel disease or a history of a blood clot in the lungs or legs -bone disease such as osteoporosis -breast cancer -diabetes -eating disorder (anorexia nervosa or bulimia) -high blood pressure -HIV infection or AIDS -kidney disease -liver disease -mental depression -migraine -seizures (convulsions) -stroke -tobacco smoker -vaginal bleeding -an unusual or allergic reaction to medroxyprogesterone, other hormones, medicines, foods, dyes, or preservatives -pregnant or trying to get pregnant -breast-feeding How should I use this medicine? Depo-Provera Contraceptive injection is given into a muscle. Depo-subQ Provera 104 injection is given under the skin. These injections are given by a health care professional. You must not be pregnant before getting an injection. The injection is usually given during the first 5 days after the start of a menstrual period or 6 weeks after delivery of a baby. Talk to your pediatrician regarding the use of this medicine in children. Special care may be needed. These injections have been used in female children who have started having menstrual periods. Overdosage: If you think you have taken too much of this medicine contact a poison control center or emergency room at once. NOTE: This medicine is only for you. Do not share this medicine with others. What if I miss a dose? Try not to miss a  dose. You must get an injection once every 3 months to maintain birth control. If you cannot keep an appointment, call and reschedule it. If you wait longer than 13 weeks between Depo-Provera contraceptive injections or longer than 14 weeks between Depo-subQ Provera 104 injections, you could get pregnant. Use another method for birth control if you miss your appointment. You may also need a pregnancy test before receiving another injection. What may interact with this medicine? Do not take this medicine with any of the following medications: -bosentan This medicine may also interact with the following medications: -aminoglutethimide -antibiotics or medicines for infections, especially rifampin, rifabutin, rifapentine, and griseofulvin -aprepitant -barbiturate medicines such as phenobarbital or primidone -bexarotene -carbamazepine -medicines for seizures like ethotoin, felbamate, oxcarbazepine, phenytoin, topiramate -modafinil -St. John's wort This list may not describe all possible interactions. Give your health care provider a list of all the medicines, herbs, non-prescription drugs, or dietary supplements you use. Also tell them if you smoke, drink alcohol, or use illegal drugs. Some items may interact with your medicine. What should I watch for while using this medicine? This drug does not protect you against HIV infection (AIDS) or other sexually transmitted diseases. Use of this product may cause you to lose calcium from your bones. Loss of calcium may cause weak bones (osteoporosis). Only use this product for more than 2 years if other forms of birth control are not right for you. The longer you use this product for birth control the more likely you will be at risk for weak bones. Ask your health care professional how you can keep strong bones. You may have a change in bleeding pattern or irregular periods. Many females stop having periods while taking this drug. If you have  received your  injections on time, your chance of being pregnant is very low. If you think you may be pregnant, see your health care professional as soon as possible. Tell your health care professional if you want to get pregnant within the next year. The effect of this medicine may last a long time after you get your last injection. What side effects may I notice from receiving this medicine? Side effects that you should report to your doctor or health care professional as soon as possible: -allergic reactions like skin rash, itching or hives, swelling of the face, lips, or tongue -breast tenderness or discharge -breathing problems -changes in vision -depression -feeling faint or lightheaded, falls -fever -pain in the abdomen, chest, groin, or leg -problems with balance, talking, walking -unusually weak or tired -yellowing of the eyes or skin Side effects that usually do not require medical attention (report to your doctor or health care professional if they continue or are bothersome): -acne -fluid retention and swelling -headache -irregular periods, spotting, or absent periods -temporary pain, itching, or skin reaction at site where injected -weight gain This list may not describe all possible side effects. Call your doctor for medical advice about side effects. You may report side effects to FDA at 1-800-FDA-1088. Where should I keep my medicine? This does not apply. The injection will be given to you by a health care professional. NOTE: This sheet is a summary. It may not cover all possible information. If you have questions about this medicine, talk to your doctor, pharmacist, or health care provider.  2013, [employer]. (03/26/2008 6:37:56 PM)  Breastfeeding A change in hormones during your pregnancy causes growth of your breast tissue and an increase in number and size of milk ducts. The hormone prolactin allows proteins, sugars, and fats from your blood supply to make breast milk in  your milk-producing glands. The hormone progesterone prevents breast milk from being released before the birth of your baby. After the birth of your baby, your progesterone level decreases allowing breast milk to be released. Thoughts of your baby, as well as his or her sucking or crying, can stimulate the release of milk from the milk-producing glands. Deciding to breastfeed (nurse) is one of the best choices you can make for you and your baby. The information that follows gives a brief review of the benefits, as well as other important skills to know about breastfeeding. BENEFITS OF BREASTFEEDING For your baby  The first milk (colostrum) helps your baby's digestive system function better.   There are antibodies in your milk that help your baby fight off infections.   Your baby has a lower incidence of asthma, allergies, and sudden infant death syndrome (SIDS).   The nutrients in breast milk are better for your baby than infant formulas.  Breast milk improves your baby's brain development.   Your baby will have less gas, colic, and constipation.  Your baby is less likely to develop other conditions, such as childhood obesity, asthma, or diabetes mellitus. For you  Breastfeeding helps develop a very special bond between you and your baby.   Breastfeeding is convenient, always available at the correct temperature, and costs nothing.   Breastfeeding helps to burn calories and helps you lose the weight gained during pregnancy.   Breastfeeding makes your uterus contract back down to normal size faster and slows bleeding following delivery.   Breastfeeding mothers have a lower risk of developing osteoporosis or breast or ovarian cancer later in life.  BREASTFEEDING FREQUENCY  A healthy, full-term baby may breastfeed as often as every hour or space his or her feedings to every 3 hours. Breastfeeding frequency will vary from baby to baby.   Newborns should be fed no less than  every 2 3 hours during the day and every 4 5 hours during the night. You should breastfeed a minimum of 8 feedings in a 24 hour period.  Awaken your baby to breastfeed if it has been 3 4 hours since the last feeding.  Breastfeed when you feel the need to reduce the fullness of your breasts or when your newborn shows signs of hunger. Signs that your baby may be hungry include:  Increased alertness or activity.  Stretching.  Movement of the head from side to side.  Movement of the head and opening of the mouth when the corner of the mouth or cheek is stroked (rooting).  Increased sucking sounds, smacking lips, cooing, sighing, or squeaking.  Hand-to-mouth movements.  Increased sucking of fingers or hands.  Fussing.  Intermittent crying.  Signs of extreme hunger will require calming and consoling before you try to feed your baby. Signs of extreme hunger may include:  Restlessness.  A loud, strong cry.  Screaming.  Frequent feeding will help you make more milk and will help prevent problems, such as sore nipples and engorgement of the breasts.  BREASTFEEDING   Whether lying down or sitting, be sure that the baby's abdomen is facing your abdomen.   Support your breast with 4 fingers under your breast and your thumb above your nipple. Make sure your fingers are well away from your nipple and your baby's mouth.   Stroke your baby's lips gently with your finger or nipple.   When your baby's mouth is open wide enough, place all of your nipple and as much of the colored area around your nipple (areola) as possible into your baby's mouth.  More areola should be visible above his or her upper lip than below his or her lower lip.  Your baby's tongue should be between his or her lower gum and your breast.  Ensure that your baby's mouth is correctly positioned around the nipple (latched). Your baby's lips should create a seal on your breast.  Signs that your baby has effectively  latched onto your nipple include:  Tugging or sucking without pain.  Swallowing heard between sucks.  Absent click or smacking sound.  Muscle movement above and in front of his or her ears with sucking.  Your baby must suck about 2 3 minutes in order to get your milk. Allow your baby to feed on each breast as long as he or she wants. Nurse your baby until he or she unlatches or falls asleep at the first breast, then offer the second breast.  Signs that your baby is full and satisfied include:  A gradual decrease in the number of sucks or complete cessation of sucking.  Falling asleep.  Extension or relaxation of his or her body.  Retention of a small amount of milk in his or her mouth.  Letting go of your breast by himself or herself.  Signs of effective breastfeeding in you include:  Breasts that have increased firmness, weight, and size prior to feeding.  Breasts that are softer after nursing.  Increased milk volume, as well as a change in milk consistency and color by the 5th day of breastfeeding.  Breast fullness relieved by breastfeeding.  Nipples are not sore, cracked, or bleeding.  If needed, break  the suction by putting your finger into the corner of your baby's mouth and sliding your finger between his or her gums. Then, remove your breast from his or her mouth.  It is common for babies to spit up a small amount after a feeding.  Babies often swallow air during feeding. This can make babies fussy. Burping your baby between breasts can help with this.  Vitamin D supplements are recommended for babies who get only breast milk.  Avoid using a pacifier during your baby's first 4 6 weeks.  Avoid supplemental feedings of water, formula, or juice in place of breastfeeding. Breast milk is all the food your baby needs. It is not necessary for your baby to have water or formula. Your breasts will make more milk if supplemental feedings are avoided during the early  weeks. HOW TO TELL WHETHER YOUR BABY IS GETTING ENOUGH BREAST MILK Wondering whether or not your baby is getting enough milk is a common concern among mothers. You can be assured that your baby is getting enough milk if:   Your baby is actively sucking and you hear swallowing.   Your baby seems relaxed and satisfied after a feeding.   Your baby nurses at least 8 12 times in a 24 hour time period.  During the first 43 51 days of age:  Your baby is wetting at least 3 5 diapers in a 24 hour period. The urine should be clear and pale yellow.  Your baby is having at least 3 4 stools in a 24 hour period. The stool should be soft and yellow.  At 72 37 days of age, your baby is having at least 3 6 stools in a 24 hour period. The stool should be seedy and yellow by 29 days of age.  Your baby has a weight loss less than 7 10% during the first 45 days of age.  Your baby does not lose weight after 30 73 days of age.  Your baby gains 4 7 ounces each week after he or she is 40 days of age.  Your baby gains weight by 29 days of age and is back to birth weight within 2 weeks. ENGORGEMENT In the first week after your baby is born, you may experience extremely full breasts (engorgement). When engorged, your breasts may feel heavy, warm, or tender to the touch. Engorgement peaks within 24 48 hours after delivery of your baby.  Engorgement may be reduced by:  Continuing to breastfeed.  Increasing the frequency of breastfeeding.  Taking warm showers or applying warm, moist heat to your breasts just before each feeding. This increases circulation and helps the milk flow.   Gently massaging your breast before and during the feedings. With your fingertips, massage from your chest wall towards your nipple in a circular motion.   Ensuring that your baby empties at least one breast at every feeding. It also helps to start the next feeding on the opposite breast.   Expressing breast milk by hand or by using a  breast pump to empty the breasts if your baby is sleepy, or not nursing well. You may also want to express milk if you are returning to work oryou feel you are getting engorged.  Ensuring your baby is latched on and positioned properly while breastfeeding. If you follow these suggestions, your engorgement should improve in 24 48 hours. If you are still experiencing difficulty, call your lactation consultant or caregiver.  CARING FOR YOURSELF Take care of your breasts.  Bathe or shower daily.   Avoid using soap on your nipples.   Wear a supportive bra. Avoid wearing underwire style bras.  Air dry your nipples for a 3 after each feeding.   Use only cotton bra pads to absorb breast milk leakage. Leaking of breast milk between feedings is normal.   Use only pure lanolin on your nipples after nursing. You do not need to wash it off before feeding your baby again. Another option is to express a few drops of breast milk and gently massage that milk into your nipples.  Continue breast self-awareness checks. Take care of yourself.  Eat healthy foods. Alternate 3 meals with 3 snacks.  Avoid foods that you notice affect your baby in a bad way.  Drink milk, fruit juice, and water to satisfy your thirst (about 8 glasses a day).   Rest often, relax, and take your prenatal vitamins to prevent fatigue, stress, and anemia.  Avoid chewing and smoking tobacco.  Avoid alcohol and drug use.  Take over-the-counter and prescribed medicine only as directed by your caregiver or pharmacist. You should always check with your caregiver or pharmacist before taking any new medicine, vitamin, or herbal supplement.  Know that pregnancy is possible while breastfeeding. If desired, talk to your caregiver about family planning and safe birth control methods that may be used while breastfeeding. SEEK MEDICAL CARE IF:   You feel like you want to stop breastfeeding or have become frustrated with  breastfeeding.  You have painful breasts or nipples.  Your nipples are cracked or bleeding.  Your breasts are red, tender, or warm.  You have a swollen area on either breast.  You have a fever or chills.  You have nausea or vomiting.  You have drainage from your nipples.  Your breasts do not become full before feedings by the 5th day after delivery.  You feel sad and depressed.  Your baby is too sleepy to eat well.  Your baby is having trouble sleeping.   Your baby is wetting less than 3 diapers in a 24 hour period.  Your baby has less than 3 stools in a 24 hour period.  Your baby's skin or the white part of his or her eyes becomes more yellow.   Your baby is not gaining weight by 30 days of age. MAKE SURE YOU:   Understand these instructions.  Will watch your condition.  Will get help right away if you are not doing well or get worse. Document Released: 03/05/2005 Document Revised: 11/28/2011 Document Reviewed: 10/10/2011 [facility] Patient Information 2014 [place], [place].

## 2012-11-28 NOTE — Progress Notes (Signed)
LATE ENTRY  Subjective:     Rhonda Fischer is a 22 y.o. female who presents for a postpartum visit. She is 5 weeks postpartum following a spontaneous vaginal delivery. I have fully reviewed the prenatal and intrapartum course. The delivery was at 39.0 gestational weeks. Outcome: spontaneous vaginal delivery. Anesthesia: epidural. Postpartum course has been uncomplicated. Baby's course has been uncomplicated. Baby is feeding by breast. Bleeding no bleeding. Bowel function is normal. Bladder function is normal. Patient is not sexually active. Contraception method is abstinence. Postpartum depression screening: negative.  The following portions of the patient's history were reviewed and updated as appropriate: allergies, current medications, past family history, past medical history, past social history, past surgical history and problem list.  Review of Systems Pertinent items are noted in HPI.   Objective:    BP 115/84  Pulse 91  Temp(Src) 98.2 F (36.8 C)  Ht 5\' 8"  (1.727 m)  Wt 140 lb 8 oz (63.73 kg)  BMI 21.37 kg/m2  Breastfeeding? Yes  General:  alert and no distress   Breasts:  inspection negative, no nipple discharge or bleeding, no masses or nodularity palpable  Lungs: clear to auscultation bilaterally  Heart:  regular rate and rhythm, S1, S2 normal, no murmur, click, rub or gallop  Abdomen: soft, non-tender; bowel sounds normal; no masses,  no organomegaly        Assessment:     normal postpartum exam. Pap smear not done at today's visit.    Plan:   1. Contraception: Depo-Provera injections 2. Follow up in: 12 weeks or as needed.

## 2013-01-05 ENCOUNTER — Ambulatory Visit (INDEPENDENT_AMBULATORY_CARE_PROVIDER_SITE_OTHER): Payer: 59

## 2013-01-05 VITALS — BP 104/73 | HR 85 | Wt 156.8 lb

## 2013-01-05 DIAGNOSIS — Z3200 Encounter for pregnancy test, result unknown: Secondary | ICD-10-CM

## 2013-01-05 DIAGNOSIS — Z3049 Encounter for surveillance of other contraceptives: Secondary | ICD-10-CM

## 2013-01-05 MED ORDER — MEDROXYPROGESTERONE ACETATE 104 MG/0.65ML ~~LOC~~ SUSP
104.0000 mg | Freq: Once | SUBCUTANEOUS | Status: AC
  Administered 2013-01-05: 104 mg via SUBCUTANEOUS

## 2013-03-30 ENCOUNTER — Ambulatory Visit (INDEPENDENT_AMBULATORY_CARE_PROVIDER_SITE_OTHER): Payer: Medicaid Other | Admitting: *Deleted

## 2013-03-30 VITALS — BP 121/76 | HR 88

## 2013-03-30 DIAGNOSIS — Z3049 Encounter for surveillance of other contraceptives: Secondary | ICD-10-CM

## 2013-03-30 MED ORDER — MEDROXYPROGESTERONE ACETATE 104 MG/0.65ML ~~LOC~~ SUSP
104.0000 mg | Freq: Once | SUBCUTANEOUS | Status: AC
  Administered 2013-03-30: 104 mg via SUBCUTANEOUS

## 2013-06-22 ENCOUNTER — Ambulatory Visit: Payer: Medicaid Other

## 2013-06-25 ENCOUNTER — Ambulatory Visit: Payer: Medicaid Other

## 2013-12-09 ENCOUNTER — Emergency Department (INDEPENDENT_AMBULATORY_CARE_PROVIDER_SITE_OTHER)
Admission: EM | Admit: 2013-12-09 | Discharge: 2013-12-09 | Disposition: A | Discharge status: Home / Self Care | Payer: Self-pay | Source: Home / Self Care | Attending: Emergency Medicine | Admitting: Emergency Medicine

## 2013-12-09 ENCOUNTER — Encounter (HOSPITAL_COMMUNITY): Payer: Self-pay | Admitting: Emergency Medicine

## 2013-12-09 DIAGNOSIS — M778 Other enthesopathies, not elsewhere classified: Secondary | ICD-10-CM

## 2013-12-09 DIAGNOSIS — M65839 Other synovitis and tenosynovitis, unspecified forearm: Secondary | ICD-10-CM

## 2013-12-09 DIAGNOSIS — M65849 Other synovitis and tenosynovitis, unspecified hand: Secondary | ICD-10-CM

## 2013-12-09 DIAGNOSIS — M779 Enthesopathy, unspecified: Principal | ICD-10-CM

## 2013-12-09 MED ORDER — INDOMETHACIN 25 MG PO CAPS: 25.0000 mg | ORAL_CAPSULE | Freq: Three times a day (TID) | ORAL | Status: DC

## 2013-12-09 MED ORDER — IBUPROFEN 800 MG PO TABS
ORAL_TABLET | ORAL | Status: AC
  Filled 2013-12-09: qty 1

## 2013-12-09 MED ORDER — IBUPROFEN 800 MG PO TABS
800.0000 mg | ORAL_TABLET | Freq: Once | ORAL | Status: AC
  Administered 2013-12-09: 800 mg via ORAL

## 2013-12-09 NOTE — ED Notes (Signed)
Pt states she has been having right hand pain since Saturday night Sunday morning. Pain woke her up out of sleep last night which brought her in to get hand checked out. Pt states that she does not know exactly what happened to her hand. She is in pain at this time c/o 9 out of a 10. Pt is in no acute distress at this time.

## 2013-12-09 NOTE — Discharge Instructions (Signed)
Please use medication as directed for pain and swelling, wear splint as needed for comfort and apply ice and elevate 2-3 x day to reduce discomfort and swelling. If symptoms do not improve over the next several days, please have yourself re-evaluated.   Tendinitis Tendinitis is swelling and inflammation of the tendons. Tendons are band-like tissues that connect muscle to bone. Tendinitis commonly occurs in the:   Shoulders (rotator cuff).  Heels (Achilles tendon).  Elbows (triceps tendon). CAUSES Tendinitis is usually caused by overusing the tendon, muscles, and joints involved. When the tissue surrounding a tendon (synovium) becomes inflamed, it is called tenosynovitis. Tendinitis commonly develops in people whose jobs require repetitive motions. SYMPTOMS  Pain.  Tenderness.  Mild swelling. DIAGNOSIS Tendinitis is usually diagnosed by physical exam. Your health care provider may also order X-rays or other imaging tests. TREATMENT Your health care provider may recommend certain medicines or exercises for your treatment. HOME CARE INSTRUCTIONS   Use a sling or splint for as long as directed by your health care provider until the pain decreases.  Put ice on the injured area.  Put ice in a plastic bag.  Place a towel between your skin and the bag.  Leave the ice on for 15-20 minutes, 3-4 times a day, or as directed by your health care provider.  Avoid using the limb while the tendon is painful. Perform gentle range of motion exercises only as directed by your health care provider. Stop exercises if pain or discomfort increase, unless directed otherwise by your health care provider.  Only take over-the-counter or prescription medicines for pain, discomfort, or fever as directed by your health care provider. SEEK MEDICAL CARE IF:   Your pain and swelling increase.  You develop new, unexplained symptoms, especially increased numbness in the hands. MAKE SURE YOU:   Understand  these instructions.  Will watch your condition.  Will get help right away if you are not doing well or get worse. Document Released: 03/02/2000 Document Revised: 07/20/2013 Document Reviewed: 05/22/2010 [facility] Patient Information 2015 [place], [place]. This information is not intended to replace advice given to you by your health care provider. Make sure you discuss any questions you have with your health care provider.

## 2013-12-09 NOTE — ED Provider Notes (Signed)
CSN: [id number]     Arrival date & time 12/09/13  1158 History   First MD Initiated Contact with Patient 12/09/13 1216     Chief Complaint  Patient presents with  . 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 Has had some relief with the use of tylenol and wearing a velcro splint. Denies previous events or recent injury. No hx of gout. Is right hand dominant. Denies additional health issues.   Patient is a 23 y.o. female presenting with hand pain. The history is provided by the patient and the spouse. The history is limited by a language barrier. A language interpreter was used.  Hand Pain This is a new problem.    Past Medical History  Diagnosis Date  . GERD (gastroesophageal reflux disease)    Past Surgical History  Procedure Laterality Date  . No past surgeries     History reviewed. No pertinent family history. History  Substance Use Topics  . Smoking status: Never Smoker   . Smokeless tobacco: Not on file  . Alcohol Use: No   OB History   Grav Para Term Preterm Abortions TAB SAB Ect Mult Living   0 0 0 0 0 0 1     Review of Systems  All other systems reviewed and are negative.   Allergies  Review of patient's allergies indicates no known allergies.  Home Medications   Prior to Admission medications   Medication Sig Start Date End Date Taking? Authorizing Provider  famotidine (PEPCID) 20 MG tablet Take 1 tablet (20 mg total) by mouth 2 (two) times daily. 09/22/12   Napoleon Form, MD  ibuprofen (ADVIL,MOTRIN) 600 MG tablet Take 1 tablet (600 mg total) by mouth every 6 (six) hours. 10/24/12   Nani Ravens, MD  indomethacin (INDOCIN) 25 MG capsule Take 1 capsule (25 mg total) by mouth 3 (three) times daily with meals. X 7 days  12/09/13   Ria Clock, PA  Prenatal Multivit-Min-Fe-FA (PRENATAL VITAMINS) 0.8 MG tablet Take 1 tablet by mouth daily. 05/08/12   Tereso Newcomer, MD   BP 103/74  Pulse 97  Temp(Src) 98 F (36.7 C) (Oral)  SpO2 99%  LMP 11/19/2013 Physical Exam  Nursing note and vitals reviewed. Constitutional: She is oriented to person, place, and time. She appears well-developed and well-nourished. No distress.  HENT:  Head: Normocephalic and atraumatic.  Eyes: Conjunctivae are normal.  Cardiovascular: Normal rate.   Pulmonary/Chest: Effort normal.  Musculoskeletal:       Right hand: She exhibits decreased range of motion and tenderness. She exhibits no bony tenderness, normal two-point discrimination, normal capillary refill, no deformity, no laceration and no swelling. Normal sensation noted. Normal strength noted.       Hands: ROM only limited by discomfort. CSM exam of right hand intact  Neurological: She is alert and oriented to person, place, and time.  Skin: Skin is warm and dry. No rash noted. No erythema.  Psychiatric: She has a normal mood and affect. Her behavior is normal.    ED Course  Procedures (including critical care time) Labs Review Labs Reviewed - No data to display  Imaging Review No results found.   MDM   1. Tendonitis of right hand    Hx and exam suggests tendonitis or overuse type injury of right  hand. Will advise RICE and NSAID therapy at home with use of velcro hand splint as needed for comfort. Indocin as prescribed for the next 7 days and return for re-evaluation if no improvement.    Ria Clock, [place] 12/09/13 1306

## 2013-12-09 NOTE — ED Provider Notes (Signed)
Medical screening examination/treatment/procedure(s) were performed by non-physician practitioner and as supervising physician I was immediately available for consultation/collaboration.  Leslee Home, M.D.  Reuben Likes, MD 12/09/13 [phone]

## 2014-01-18 ENCOUNTER — Encounter (HOSPITAL_COMMUNITY): Payer: Self-pay | Admitting: Emergency Medicine

## 2014-03-19 NOTE — L&D Delivery Note (Cosign Needed)
Delivery Note Pt pushed with a couple of ctx and at 9:32 PM a viable female was delivered via Vaginal, Spontaneous Delivery (Presentation: Right Occiput Anterior).  APGAR: 8, 9; weight: pending. MSF noted- bulb suctioned and dried at delivery.  Cord clamped and cut by family member of pt. Hospital cord blood sample collected. Placenta status: Intact, Spontaneous.  Cord: 3 vessels   Anesthesia: None  Episiotomy: None Lacerations: None Est. Blood Loss (mL): 200  Mom to postpartum.  Baby to Couplet care / Skin to Skin.  Cam HaiSHAW, KIMBERLY CNM 02/18/2015, 9:57 PM

## 2014-06-24 ENCOUNTER — Emergency Department (INDEPENDENT_AMBULATORY_CARE_PROVIDER_SITE_OTHER)
Admission: EM | Admit: 2014-06-24 | Discharge: 2014-06-24 | Disposition: A | Discharge status: Home / Self Care | Payer: Self-pay | Source: Home / Self Care | Attending: Family Medicine | Admitting: Family Medicine

## 2014-06-24 ENCOUNTER — Encounter (HOSPITAL_COMMUNITY): Payer: Self-pay | Admitting: Emergency Medicine

## 2014-06-24 DIAGNOSIS — N949 Unspecified condition associated with female genital organs and menstrual cycle: Secondary | ICD-10-CM

## 2014-06-24 DIAGNOSIS — N9489 Other specified conditions associated with female genital organs and menstrual cycle: Secondary | ICD-10-CM

## 2014-06-24 LAB — POCT URINALYSIS DIP (DEVICE)
Bilirubin Urine: NEGATIVE
Glucose, UA: NEGATIVE mg/dL
HGB URINE DIPSTICK: NEGATIVE
Ketones, ur: NEGATIVE mg/dL
Leukocytes, UA: NEGATIVE
Nitrite: NEGATIVE
PH: 7.5 (ref 5.0–8.0)
PROTEIN: NEGATIVE mg/dL
SPECIFIC GRAVITY, URINE: 1.02 (ref 1.005–1.030)
UROBILINOGEN UA: 0.2 mg/dL (ref 0.0–1.0)

## 2014-06-24 NOTE — ED Provider Notes (Signed)
CSN: [id number]     Arrival date & time 06/24/14  1547 History   First MD Initiated Contact with Patient 06/24/14 1639     No chief complaint on file.  (Consider location/radiation/quality/duration/timing/severity/associated sxs/prior Treatment) Patient is a 24 y.o. female presenting with cramps. The history is provided by the patient. The history is limited by a language barrier. A language interpreter was used (husband trans.).  Abdominal Cramping This is a new problem. The current episode started more than 1 week ago (2 wks off and on, lmp 3/22, pos home preg test, no vag d/c or bleeding, no pain at this time.). The problem has not changed since onset.Associated symptoms include abdominal pain. Associated symptoms comments: Intermittent sx.  .    Past Medical History  Diagnosis Date  . GERD (gastroesophageal reflux disease)    Past Surgical History  Procedure Laterality Date  . No past surgeries     No family history on file. History  Substance Use Topics  . Smoking status: Never Smoker   . Smokeless tobacco: Not on file  . Alcohol Use: No   OB History    Gravida Para Term Preterm AB TAB SAB Ectopic Multiple Living   1 1 1  0 0 0 0 0 0 1     Review of Systems  Constitutional: Negative.   Gastrointestinal: Positive for abdominal pain. Negative for nausea, vomiting and diarrhea.  Genitourinary: Positive for menstrual problem. Negative for dysuria, frequency, vaginal bleeding, vaginal discharge and pelvic pain.       Sx are periumbilical    Allergies  Review of patient's allergies indicates no known allergies.  Home Medications   Prior to Admission medications   Medication Sig Start Date End Date Taking? Authorizing Provider  famotidine (PEPCID) 20 MG tablet Take 1 tablet (20 mg total) by mouth 2 (two) times daily. 09/22/12   Napoleon FormPamela Ferry, MD  ibuprofen (ADVIL,MOTRIN) 600 MG tablet Take 1 tablet (600 mg total) by mouth every 6 (six) hours. 10/24/12   Nani RavensAndrew M Wight, MD   indomethacin (INDOCIN) 25 MG capsule Take 1 capsule (25 mg total) by mouth 3 (three) times daily with meals. X 7 days 12/09/13   Ria ClockJennifer Lee H Presson, PA  Prenatal Multivit-Min-Fe-FA (PRENATAL VITAMINS) 0.8 MG tablet Take 1 tablet by mouth daily. 05/08/12   Jethro BastosUgonna A Anyanwu, MD   BP 104/63 mmHg  Pulse 76  Temp(Src) 98.7 F (37.1 C) (Oral)  Resp 18  SpO2 100% Physical Exam  Constitutional: She is oriented to person, place, and time. She appears well-developed and well-nourished.  Neck: Normal range of motion. Neck supple.  Abdominal: Soft. Bowel sounds are normal. She exhibits no mass. There is no tenderness.  Lymphadenopathy:    She has no cervical adenopathy.  Neurological: She is alert and oriented to person, place, and time.  Skin: Skin is warm.  Nursing note and vitals reviewed.   ED Course  Procedures (including critical care time) Labs Review Labs Reviewed - No data to display  Imaging Review No results found.   MDM  No diagnosis found.     Linna HoffJames D Kindl, MD 06/24/14 [phone]

## 2014-06-24 NOTE — Discharge Instructions (Signed)
See your doctor as planned, heat and tylenol if needed. [facility] if bleeding occurs or pain gets more severe.

## 2014-06-24 NOTE — ED Notes (Signed)
Reports abdominal cramping for one week.  Has had positive home preg tests.  Lmp 3/22.  Reports last bm yesterday, no vaginal discharge, navel hurts with urination.

## 2014-06-29 ENCOUNTER — Inpatient Hospital Stay (HOSPITAL_COMMUNITY): Payer: Self-pay

## 2014-06-29 ENCOUNTER — Inpatient Hospital Stay (HOSPITAL_COMMUNITY)
Admission: EM | Admit: 2014-06-29 | Discharge: 2014-06-29 | Disposition: A | Discharge status: Home / Self Care | Payer: Self-pay | Source: Ambulatory Visit | Attending: Family Medicine | Admitting: Family Medicine

## 2014-06-29 ENCOUNTER — Encounter (HOSPITAL_COMMUNITY): Payer: Self-pay | Admitting: *Deleted

## 2014-06-29 DIAGNOSIS — R109 Unspecified abdominal pain: Secondary | ICD-10-CM | POA: Insufficient documentation

## 2014-06-29 DIAGNOSIS — Z3A01 Less than 8 weeks gestation of pregnancy: Secondary | ICD-10-CM | POA: Insufficient documentation

## 2014-06-29 DIAGNOSIS — O26899 Other specified pregnancy related conditions, unspecified trimester: Secondary | ICD-10-CM

## 2014-06-29 DIAGNOSIS — O9989 Other specified diseases and conditions complicating pregnancy, childbirth and the puerperium: Secondary | ICD-10-CM | POA: Insufficient documentation

## 2014-06-29 DIAGNOSIS — R102 Pelvic and perineal pain: Secondary | ICD-10-CM | POA: Insufficient documentation

## 2014-06-29 LAB — CBC
HCT: 42.8 % (ref 36.0–46.0)
Hemoglobin: 14.5 g/dL (ref 12.0–15.0)
MCH: 26.5 pg (ref 26.0–34.0)
MCHC: 33.9 g/dL (ref 30.0–36.0)
MCV: 78.1 fL (ref 78.0–100.0)
PLATELETS: 198 10*3/uL (ref 150–400)
RBC: 5.48 MIL/uL — AB (ref 3.87–5.11)
RDW: 13.2 % (ref 11.5–15.5)
WBC: 8.4 10*3/uL (ref 4.0–10.5)

## 2014-06-29 LAB — URINALYSIS, ROUTINE W REFLEX MICROSCOPIC
BILIRUBIN URINE: NEGATIVE
GLUCOSE, UA: NEGATIVE mg/dL
KETONES UR: NEGATIVE mg/dL
Leukocytes, UA: NEGATIVE
NITRITE: NEGATIVE
PH: 6 (ref 5.0–8.0)
Protein, ur: NEGATIVE mg/dL
Specific Gravity, Urine: 1.025 (ref 1.005–1.030)
Urobilinogen, UA: 0.2 mg/dL (ref 0.0–1.0)

## 2014-06-29 LAB — POCT PREGNANCY, URINE: PREG TEST UR: POSITIVE — AB

## 2014-06-29 LAB — WET PREP, GENITAL
Clue Cells Wet Prep HPF POC: NONE SEEN
TRICH WET PREP: NONE SEEN
YEAST WET PREP: NONE SEEN

## 2014-06-29 LAB — URINE MICROSCOPIC-ADD ON

## 2014-06-29 LAB — HCG, QUANTITATIVE, PREGNANCY: HCG, BETA CHAIN, QUANT, S: 66198 m[IU]/mL — AB (ref <5)

## 2014-06-29 MED ORDER — PROMETHAZINE HCL 25 MG PO TABS: 25.0000 mg | ORAL_TABLET | Freq: Four times a day (QID) | ORAL | Status: DC | PRN

## 2014-06-29 NOTE — MAU Provider Note (Signed)
History     CSN: [id number]  Arrival date and time: 06/29/14 1140   First Provider Initiated Contact with Patient 06/29/14 1519      Chief Complaint  Patient presents with  . Abdominal Cramping   HPI:   Patient reports having abdominal cramping for 3 weeks, but says it increased in severity last night and woke her from her sleep 2-3 times. She took Tylenol for the pain last night and says that provided some relief. She currently rates her pain as being 5/10 and located peri-umbilically. She reports taking a home pregnancy test 2 weeks ago and says the test was repeated by an urgent care provider. She denies any discharge, urinary frequency, dysuria, nausea, vomiting, diarrhea, or constipation. Reports her last bowel movement was last night.  OB History    Gravida Para Term Preterm AB TAB SAB Ectopic Multiple Living   2 1 1  0 0 0 0 0 0 1      Past Medical History  Diagnosis Date  . GERD (gastroesophageal reflux disease)     Past Surgical History  Procedure Laterality Date  . No past surgeries      History reviewed. No pertinent family history.  History  Substance Use Topics  . Smoking status: Never Smoker   . Smokeless tobacco: Not on file  . Alcohol Use: No    Allergies: No Known Allergies  Prescriptions prior to admission  Medication Sig Dispense Refill Last Dose  . acetaminophen (TYLENOL) 500 MG tablet Take 500 mg by mouth every 6 (six) hours as needed.   06/28/2014 at Unknown time  . famotidine (PEPCID) 20 MG tablet Take 1 tablet (20 mg total) by mouth 2 (two) times daily. (Patient not taking: Reported on 06/29/2014) 60 tablet 2 Unknown at Unknown time  . ibuprofen (ADVIL,MOTRIN) 600 MG tablet Take 1 tablet (600 mg total) by mouth every 6 (six) hours. (Patient not taking: Reported on 06/29/2014) 30 tablet 0 Unknown at Unknown time  . indomethacin (INDOCIN) 25 MG capsule Take 1 capsule (25 mg total) by mouth 3 (three) times daily with meals. X 7 days (Patient not  taking: Reported on 06/29/2014) 21 capsule 0 Unknown at Unknown time  . Prenatal Multivit-Min-Fe-FA (PRENATAL VITAMINS) 0.8 MG tablet Take 1 tablet by mouth daily. 30 tablet 12 Unknown at Unknown time    Review of Systems  Constitutional: Negative for fever, chills and diaphoresis.  HENT: Negative for congestion and sore throat.   Respiratory: Negative for cough, sputum production, shortness of breath and wheezing.   Cardiovascular: Negative for chest pain, palpitations, orthopnea and leg swelling.  Gastrointestinal: Positive for abdominal pain. Negative for heartburn, nausea, vomiting, diarrhea, constipation and blood in stool.  Genitourinary: Negative for dysuria, urgency and frequency.  Musculoskeletal: Negative for back pain.  Neurological: Negative for seizures, loss of consciousness, weakness and headaches.  Endo/Heme/Allergies: Negative for polydipsia.   Physical Exam   Blood pressure 128/70, pulse 75, temperature 97.8 F (36.6 C), temperature source Oral, resp. rate 18, height 5' 6.5" (1.689 m), weight 71.668 kg (158 lb), last menstrual period 06/01/2014.  Physical Exam  Nursing note and vitals reviewed. Constitutional: She appears well-developed and well-nourished. No distress.  HENT:  Head: Normocephalic and atraumatic.  Eyes: Pupils are equal, round, and reactive to light. Right eye exhibits no discharge. Left eye exhibits no discharge.  Neck: Normal range of motion. Neck supple. No thyromegaly present.  Cardiovascular: Normal rate, regular rhythm, normal heart sounds and intact distal pulses.  Exam reveals no  gallop and no friction rub.   No murmur heard. Respiratory: Effort normal and breath sounds normal. No stridor. No respiratory distress. She has no wheezes. She has no rales. She exhibits no tenderness.  GI: Soft. Bowel sounds are normal. She exhibits no distension and no mass. Tenderness: in epigastric region. There is no rebound and no guarding.  Genitourinary:   Swabs collected blindly, without speculum. Labia appear normal, without any lesions. No visible discharge.  Lymphadenopathy:    She has no cervical adenopathy.  Skin: She is not diaphoretic.    Results for orders placed or performed during the hospital encounter of 06/29/14 (from the past 24 hour(s))  Urinalysis, Routine w reflex microscopic     Status: Abnormal   Collection Time: 06/29/14 12:55 PM  Result Value Ref Range   Color, Urine YELLOW YELLOW   APPearance CLEAR CLEAR   Specific Gravity, Urine 1.025 1.005 - 1.030   pH 6.0 5.0 - 8.0   Glucose, UA NEGATIVE NEGATIVE mg/dL   Hgb urine dipstick SMALL (A) NEGATIVE   Bilirubin Urine NEGATIVE NEGATIVE   Ketones, ur NEGATIVE NEGATIVE mg/dL   Protein, ur NEGATIVE NEGATIVE mg/dL   Urobilinogen, UA 0.2 0.0 - 1.0 mg/dL   Nitrite NEGATIVE NEGATIVE   Leukocytes, UA NEGATIVE NEGATIVE  Urine microscopic-add on     Status: Abnormal   Collection Time: 06/29/14 12:55 PM  Result Value Ref Range   Squamous Epithelial / LPF FEW (A) RARE   RBC / HPF 0-2 <3 RBC/hpf   Urine-Other MUCOUS PRESENT   Pregnancy, urine POC     Status: Abnormal   Collection Time: 06/29/14  1:00 PM  Result Value Ref Range   Preg Test, Ur POSITIVE (A) NEGATIVE  hCG, quantitative, pregnancy     Status: Abnormal   Collection Time: 06/29/14  1:35 PM  Result Value Ref Range   hCG, Beta Chain, Quant, S [phone] (H) <5 mIU/mL  CBC     Status: Abnormal   Collection Time: 06/29/14  1:35 PM  Result Value Ref Range   WBC 8.4 4.0 - 10.5 K/uL   RBC 5.48 (H) 3.87 - 5.11 MIL/uL   Hemoglobin 14.5 12.0 - 15.0 g/dL   HCT 60.4 54.0 - 98.1 %   MCV 78.1 78.0 - 100.0 fL   MCH 26.5 26.0 - 34.0 pg   MCHC 33.9 30.0 - 36.0 g/dL   RDW 19.1 47.8 - 29.5 %   Platelets 198 150 - 400 K/uL  Wet prep, genital     Status: Abnormal   Collection Time: 06/29/14  3:41 PM  Result Value Ref Range   Yeast Wet Prep HPF POC NONE SEEN NONE SEEN   Trich, Wet Prep NONE SEEN NONE SEEN   Clue Cells Wet Prep  HPF POC NONE SEEN NONE SEEN   WBC, Wet Prep HPF POC FEW (A) NONE SEEN   US Ob Comp Less 14 Wks  06/29/2014   CLINICAL DATA:  Pregnant, pelvic pain  EXAM: OBSTETRIC <14 WK [place] AND TRANSVAGINAL OB US  TECHNIQUE: Both transabdominal and transvaginal ultrasound examinations were performed for complete evaluation of the gestation as well as the maternal uterus, adnexal regions, and pelvic cul-de-sac. Transvaginal technique was performed to assess early pregnancy.  COMPARISON:  None.  FINDINGS: Intrauterine gestational sac: Visualized/normal in shape.  Yolk sac:  Present  Embryo:  Present  Cardiac Activity: Present  Heart Rate: 106  bpm  CRL:  3.8  mm   6 w   1 d  [place] EDC: 02/21/2015  Maternal uterus/adnexae: No subchronic hemorrhage.  Bilateral ovaries are within normal limits.  No free fluid.  IMPRESSION: Single live intrauterine gestation, with estimated gestational age [redacted] weeks 1 day by crown-rump length.   Electronically Signed   By: Charline Bills M.D.   On: 06/29/2014 16:21   US Ob Transvaginal  06/29/2014   CLINICAL DATA:  Pregnant, pelvic pain  EXAM: OBSTETRIC <14 WK [place] AND TRANSVAGINAL OB US  TECHNIQUE: Both transabdominal and transvaginal ultrasound examinations were performed for complete evaluation of the gestation as well as the maternal uterus, adnexal regions, and pelvic cul-de-sac. Transvaginal technique was performed to assess early pregnancy.  COMPARISON:  None.  FINDINGS: Intrauterine gestational sac: Visualized/normal in shape.  Yolk sac:  Present  Embryo:  Present  Cardiac Activity: Present  Heart Rate: 106  bpm  CRL:  3.8  mm   6 w   1 d                  [place] EDC: 02/21/2015  Maternal uterus/adnexae: No subchronic hemorrhage.  Bilateral ovaries are within normal limits.  No free fluid.  IMPRESSION: Single live intrauterine gestation, with estimated gestational age [redacted] weeks 1 day by crown-rump length.   Electronically Signed   By: Charline Bills M.D.   On: 06/29/2014 16:21      MAU Course  Procedures  MDM UA, Urine Pregnancy, HIV, CBC, hCG quant, Wet Prep, and GC/Chlamydia collected.   US showed intrauterine gestation with gestational sac, yolk sac, and embryo present. Estimated gestational age of [redacted]w[redacted]d.  Assessment and Plan  Abdominal pain in pregnancy  Discharge  F/U with prenatal care  Clemmons,Lori Grissett 06/29/2014, 3:32 PM

## 2014-06-29 NOTE — Discharge Instructions (Signed)
Abdominal Pain During Pregnancy °Abdominal pain is common in pregnancy. Most of the time, it does not cause harm. There are many causes of abdominal pain. Some causes are more serious than others. Some of the causes of abdominal pain in pregnancy are easily diagnosed. Occasionally, the diagnosis takes time to understand. Other times, the cause is not determined. Abdominal pain can be a sign that something is very wrong with the pregnancy, or the pain may have nothing to do with the pregnancy at all. For this reason, always tell your health care provider if you have any abdominal discomfort. °HOME CARE INSTRUCTIONS  °Monitor your abdominal pain for any changes. The following actions may help to alleviate any discomfort you are experiencing: °· Do not have sexual intercourse or put anything in your vagina until your symptoms go away completely. °· Get plenty of rest until your pain improves. °· Drink clear fluids if you feel nauseous. Avoid solid food as long as you are uncomfortable or nauseous. °· Only take over-the-counter or prescription medicine as directed by your health care provider. °· Keep all follow-up appointments with your health care provider. °SEEK IMMEDIATE MEDICAL CARE IF: °· You are bleeding, leaking fluid, or passing tissue from the vagina. °· You have increasing pain or cramping. °· You have persistent vomiting. °· You have painful or bloody urination. °· You have a fever. °· You notice a decrease in your baby's movements. °· You have extreme weakness or feel faint. °· You have shortness of breath, with or without abdominal pain. °· You develop a severe headache with abdominal pain. °· You have abnormal vaginal discharge with abdominal pain. °· You have persistent diarrhea. °· You have abdominal pain that continues even after rest, or gets worse. °MAKE SURE YOU:  °· Understand these instructions. °· Will watch your condition. °· Will get help right away if you are not doing well or get  worse. °Document Released: 03/05/2005 Document Revised: 12/24/2012 Document Reviewed: 10/02/2012 °ExitCare® Patient Information ©2015 [employer]. This information is not intended to replace advice given to you by your health care provider. Make sure you discuss any questions you have with your health care provider. °First Trimester of Pregnancy °The first trimester of pregnancy is from week 1 until the end of week 12 (months 1 through 3). During this time, your baby will begin to develop inside you. At 6-8 weeks, the eyes and face are formed, and the heartbeat can be seen on ultrasound. At the end of 12 weeks, all the baby's organs are formed. Prenatal care is all the medical care you receive before the birth of your baby. Make sure you get good prenatal care and follow all of your doctor's instructions. °HOME CARE  °Medicines °· Take medicine only as told by your doctor. Some medicines are safe and some are not during pregnancy. °· Take your prenatal vitamins as told by your doctor. °· Take medicine that helps you poop (stool softener) as needed if your doctor says it is okay. °Diet °· Eat regular, healthy meals. °· Your doctor will tell you the amount of weight gain that is right for you. °· Avoid raw meat and uncooked cheese. °· If you feel sick to your stomach (nauseous) or throw up (vomit): °¨ Eat 4 or 5 small meals a day instead of 3 large meals. °¨ Try eating a few soda crackers. °¨ Drink liquids between meals instead of during meals. °· If you have a hard time pooping (constipation): °¨ Eat high-fiber foods   like fresh vegetables, fruit, and whole grains. °¨ Drink enough fluids to keep your pee (urine) clear or pale yellow. °Activity and Exercise °· Exercise only as told by your doctor. Stop exercising if you have cramps or pain in your lower belly (abdomen) or low back. °· Try to avoid standing for long periods of time. Move your legs often if you must stand in one place for a long time. °· Avoid heavy  lifting. °· Wear low-heeled shoes. Sit and stand up straight. °· You can have sex unless your doctor tells you not to. °Relief of Pain or Discomfort °· Wear a good support bra if your breasts are sore. °· Take warm water baths (sitz baths) to soothe pain or discomfort caused by hemorrhoids. Use hemorrhoid cream if your doctor says it is okay. °· Rest with your legs raised if you have leg cramps or low back pain. °· Wear support hose if you have puffy, bulging veins (varicose veins) in your legs. Raise (elevate) your feet for 15 minutes, 3-4 times a day. Limit salt in your diet. °Prenatal Care °· Schedule your prenatal visits by the twelfth week of pregnancy. °· Write down your questions. Take them to your prenatal visits. °· Keep all your prenatal visits as told by your doctor. °Safety °· Wear your seat belt at all times when driving. °· Make a list of emergency phone numbers. The list should include numbers for family, friends, the hospital, and police and fire departments. °General Tips °· Ask your doctor for a referral to a local prenatal class. Begin classes no later than at the start of month 6 of your pregnancy. °· Ask for help if you need counseling or help with nutrition. Your doctor can give you advice or tell you where to go for help. °· Do not use hot tubs, steam rooms, or saunas. °· Do not douche or use tampons or scented sanitary pads. °· Do not cross your legs for long periods of time. °· Avoid litter boxes and soil used by cats. °· Avoid all smoking, herbs, and alcohol. Avoid drugs not approved by your doctor. °· Visit your dentist. At home, brush your teeth with a soft toothbrush. Be gentle when you floss. °GET HELP IF: °· You are dizzy. °· You have mild cramps or pressure in your lower belly. °· You have a nagging pain in your belly area. °· You continue to feel sick to your stomach, throw up, or have watery poop (diarrhea). °· You have a bad smelling fluid coming from your vagina. °· You have pain  with peeing (urination). °· You have increased puffiness (swelling) in your face, hands, legs, or ankles. °GET HELP RIGHT AWAY IF:  °· You have a fever. °· You are leaking fluid from your vagina. °· You have spotting or bleeding from your vagina. °· You have very bad belly cramping or pain. °· You gain or lose weight rapidly. °· You throw up blood. It may look like coffee grounds. °· You are around people who have German measles, fifth disease, or chickenpox. °· You have a very bad headache. °· You have shortness of breath. °· You have any kind of trauma, such as from a fall or a car accident. °Document Released: 08/22/2007 Document Revised: 07/20/2013 Document Reviewed: 01/13/2013 °ExitCare® Patient Information ©2015 [employer]. This information is not intended to replace advice given to you by your health care provider. Make sure you discuss any questions you have with your health care provider. ° °

## 2014-06-29 NOTE — MAU Note (Addendum)
Reports +home test 2 wks ago.  Test done at [facility], no results visible or documented.  Will repeat.

## 2014-06-29 NOTE — MAU Note (Signed)
Cramping in abd, all over.  Bad last night, could not sleep.  No bleeding or discharg. No GI or GU complaints.

## 2014-06-30 LAB — HIV ANTIBODY (ROUTINE TESTING W REFLEX): HIV SCREEN 4TH GENERATION: NONREACTIVE

## 2014-06-30 LAB — GC/CHLAMYDIA PROBE AMP (~~LOC~~) NOT AT ARMC
CHLAMYDIA, DNA PROBE: NEGATIVE
Neisseria Gonorrhea: NEGATIVE

## 2014-08-02 ENCOUNTER — Encounter: Payer: Self-pay | Admitting: Obstetrics & Gynecology

## 2014-08-06 ENCOUNTER — Encounter: Payer: Self-pay | Admitting: Obstetrics and Gynecology

## 2014-08-06 ENCOUNTER — Ambulatory Visit (INDEPENDENT_AMBULATORY_CARE_PROVIDER_SITE_OTHER): Payer: Self-pay | Admitting: Obstetrics and Gynecology

## 2014-08-06 VITALS — BP 123/77 | HR 105 | Temp 98.4°F | Wt 144.0 lb

## 2014-08-06 DIAGNOSIS — O219 Vomiting of pregnancy, unspecified: Secondary | ICD-10-CM

## 2014-08-06 DIAGNOSIS — Z3491 Encounter for supervision of normal pregnancy, unspecified, first trimester: Secondary | ICD-10-CM

## 2014-08-06 DIAGNOSIS — Z113 Encounter for screening for infections with a predominantly sexual mode of transmission: Secondary | ICD-10-CM

## 2014-08-06 DIAGNOSIS — Z118 Encounter for screening for other infectious and parasitic diseases: Secondary | ICD-10-CM

## 2014-08-06 DIAGNOSIS — Z124 Encounter for screening for malignant neoplasm of cervix: Secondary | ICD-10-CM

## 2014-08-06 LAB — POCT URINALYSIS DIP (DEVICE)
GLUCOSE, UA: NEGATIVE mg/dL
LEUKOCYTES UA: NEGATIVE
NITRITE: NEGATIVE
PROTEIN: 30 mg/dL — AB
Specific Gravity, Urine: >=1.03 (ref 1.005–1.030)
UROBILINOGEN UA: 1 mg/dL (ref 0.0–1.0)
pH: 5.5 (ref 5.0–8.0)

## 2014-08-06 LAB — OB RESULTS CONSOLE GC/CHLAMYDIA: GC PROBE AMP, GENITAL: NEGATIVE

## 2014-08-06 MED ORDER — DOXYLAMINE-PYRIDOXINE 10-10 MG PO TBEC: 1.0000 | DELAYED_RELEASE_TABLET | Freq: Two times a day (BID) | ORAL | Status: DC

## 2014-08-06 MED ORDER — PRENATAL PLUS 27-1 MG PO TABS: 1.0000 | ORAL_TABLET | Freq: Every day | ORAL | Status: DC

## 2014-08-06 NOTE — Progress Notes (Signed)
   Subjective:    Rhonda Fischer is a G2P1001 742w4d G2P1001 at 3242w4d BEGA by US at 3568w1d being seen today for her first obstetrical visit.  Her obstetrical history is significant for uncomplicated P1 with NSVD. Patient does intend to breast feed. Pregnancy history fully reviewed.  Patient reports nausea and vomiting. Has Rx Phenergan but not tried taking.   Filed Vitals:   08/06/14 0932  BP: 123/77  Pulse: 105  Temp: 98.4 F (36.9 C)  Weight: 144 lb (65.318 kg)    HISTORY: OB History  Gravida Para Term Preterm AB SAB TAB Ectopic Multiple Living  2 1 1  0 0 0 0 0 0 1    # Outcome Date GA Lbr Len/2nd Weight Sex Delivery Anes PTL Lv  2 Current           1 Term 10/23/12 2462w0d 08:13 / 01:50 6 lb 7.5 oz (2.935 kg) F Vag-Spont EPI  Y     Past Medical History  Diagnosis Date  . GERD (gastroesophageal reflux disease)    Past Surgical History  Procedure Laterality Date  . No past surgeries     History reviewed. No pertinent family history.   Exam    Uterus:   S=D;  DT [id number]  Pelvic Exam:    Perineum: Normal Perineum   Vulva: Bartholin's, Urethra, Skene's normal   Vagina:  normal mucosa, normal discharge       Cervix: no bleeding following Pap and L/C   Adnexa: not evaluated   Bony Pelvis: average  System: Breast:  normal appearance, no masses or tenderness   Skin: normal coloration and turgor, no rashes    Neurologic: oriented, normal, grossly non-focal, normal mood   Extremities: normal strength, tone, and muscle mass   HEENT PERRLA, extra ocular movement intact, neck supple with midline trachea and thyroid without masses   Mouth/Teeth mucous membranes moist, pharynx normal without lesions and dental hygiene good   Neck supple and no masses   Cardiovascular: regular rate and rhythm, no murmurs or gallops   Respiratory:  appears well, vitals normal, no respiratory distress, acyanotic, normal RR, ear and throat exam is normal, neck free of mass or lymphadenopathy, chest  clear, no wheezing, crepitations, rhonchi, normal symmetric air entry   Abdomen: soft, non-tender; bowel sounds normal; no masses,  no organomegaly   Urinary: urethral meatus normal      Assessment:    Pregnancy: G2P1001 Patient Active Problem List   Diagnosis Date Noted  . Nausea and vomiting during pregnancy prior to [redacted] weeks gestation 08/06/2014  . Language barrier 05/08/2012  . Supervision of normal pregnancy 04/10/2012        Plan:     Initial labs drawn. Prenatal vitamins Rx and Diclegis Rx. RecommnedTums if reflux sx Problem list reviewed and updated. Genetic Screening discussed Quad Screen: undecided.  Ultrasound discussed; fetal survey: requested.  Follow up in 4 weeks. 50% of 30 min visit spent on counseling and coordination of care.  Note to work: no heavy lifting.    Rhonda Fischer 08/06/2014

## 2014-08-06 NOTE — Progress Notes (Signed)
Moderate Hgb on udip

## 2014-08-06 NOTE — Progress Notes (Signed)
Pt states have noticed vomit this morning was blood tinged.

## 2014-08-06 NOTE — Patient Instructions (Signed)
First Trimester of Pregnancy The first trimester of pregnancy is from week 1 until the end of week 12 (months 1 through 3). A week after a sperm fertilizes an egg, the egg will implant on the wall of the uterus. This embryo will begin to develop into a baby. Genes from you and your partner are forming the baby. The female genes determine whether the baby is a boy or a girl. At 6-8 weeks, the eyes and face are formed, and the heartbeat can be seen on ultrasound. At the end of 12 weeks, all the baby's organs are formed.  Now that you are pregnant, you will want to do everything you can to have a healthy baby. Two of the most important things are to get good prenatal care and to follow your health care provider's instructions. Prenatal care is all the medical care you receive before the baby's birth. This care will help prevent, find, and treat any problems during the pregnancy and childbirth. BODY CHANGES Your body goes through many changes during pregnancy. The changes vary from woman to woman.   You may gain or lose a couple of pounds at first.  You may feel sick to your stomach (nauseous) and throw up (vomit). If the vomiting is uncontrollable, call your health care provider.  You may tire easily.  You may develop headaches that can be relieved by medicines approved by your health care provider.  You may urinate more often. Painful urination may mean you have a bladder infection.  You may develop heartburn as a result of your pregnancy.  You may develop constipation because certain hormones are causing the muscles that push waste through your intestines to slow down.  You may develop hemorrhoids or swollen, bulging veins (varicose veins).  Your breasts may begin to grow larger and become tender. Your nipples may stick out more, and the tissue that surrounds them (areola) may become darker.  Your gums may bleed and may be sensitive to brushing and flossing.  Dark spots or blotches (chloasma,  mask of pregnancy) may develop on your face. This will likely fade after the baby is born.  Your menstrual periods will stop.  You may have a loss of appetite.  You may develop cravings for certain kinds of food.  You may have changes in your emotions from day to day, such as being excited to be pregnant or being concerned that something may go wrong with the pregnancy and baby.  You may have more vivid and strange dreams.  You may have changes in your hair. These can include thickening of your hair, rapid growth, and changes in texture. Some women also have hair loss during or after pregnancy, or hair that feels dry or thin. Your hair will most likely return to normal after your baby is born. WHAT TO EXPECT AT YOUR PRENATAL VISITS During a routine prenatal visit:  You will be weighed to make sure you and the baby are growing normally.  Your blood pressure will be taken.  Your abdomen will be measured to track your baby's growth.  The fetal heartbeat will be listened to starting around week 10 or 12 of your pregnancy.  Test results from any previous visits will be discussed. Your health care provider may ask you:  How you are feeling.  If you are feeling the baby move.  If you have had any abnormal symptoms, such as leaking fluid, bleeding, severe headaches, or abdominal cramping.  If you have any questions. Other tests   that may be performed during your first trimester include:  Blood tests to find your blood type and to check for the presence of any previous infections. They will also be used to check for low iron levels (anemia) and Rh antibodies. Later in the pregnancy, blood tests for diabetes will be done along with other tests if problems develop.  Urine tests to check for infections, diabetes, or protein in the urine.  An ultrasound to confirm the proper growth and development of the baby.  An amniocentesis to check for possible genetic problems.  Fetal screens for  spina bifida and Down syndrome.  You may need other tests to make sure you and the baby are doing well. HOME CARE INSTRUCTIONS  Medicines  Follow your health care provider's instructions regarding medicine use. Specific medicines may be either safe or unsafe to take during pregnancy.  Take your prenatal vitamins as directed.  If you develop constipation, try taking a stool softener if your health care provider approves. Diet  Eat regular, well-balanced meals. Choose a variety of foods, such as meat or vegetable-based protein, fish, milk and low-fat dairy products, vegetables, fruits, and whole grain breads and cereals. Your health care provider will help you determine the amount of weight gain that is right for you.  Avoid raw meat and uncooked cheese. These carry germs that can cause birth defects in the baby.  Eating four or five small meals rather than three large meals a day may help relieve nausea and vomiting. If you start to feel nauseous, eating a few soda crackers can be helpful. Drinking liquids between meals instead of during meals also seems to help nausea and vomiting.  If you develop constipation, eat more high-fiber foods, such as fresh vegetables or fruit and whole grains. Drink enough fluids to keep your urine clear or pale yellow. Activity and Exercise  Exercise only as directed by your health care provider. Exercising will help you:  Control your weight.  Stay in shape.  Be prepared for labor and delivery.  Experiencing pain or cramping in the lower abdomen or low back is a good sign that you should stop exercising. Check with your health care provider before continuing normal exercises.  Try to avoid standing for long periods of time. Move your legs often if you must stand in one place for a long time.  Avoid heavy lifting.  Wear low-heeled shoes, and practice good posture.  You may continue to have sex unless your health care provider directs you  otherwise. Relief of Pain or Discomfort  Wear a good support bra for breast tenderness.   Take warm sitz baths to soothe any pain or discomfort caused by hemorrhoids. Use hemorrhoid cream if your health care provider approves.   Rest with your legs elevated if you have leg cramps or low back pain.  If you develop varicose veins in your legs, wear support hose. Elevate your feet for 15 minutes, 3-4 times a day. Limit salt in your diet. Prenatal Care  Schedule your prenatal visits by the twelfth week of pregnancy. They are usually scheduled monthly at first, then more often in the last 2 months before delivery.  Write down your questions. Take them to your prenatal visits.  Keep all your prenatal visits as directed by your health care provider. Safety  Wear your seat belt at all times when driving.  Make a list of emergency phone numbers, including numbers for family, friends, the hospital, and police and fire departments. General Tips    Ask your health care provider for a referral to a local prenatal education class. Begin classes no later than at the beginning of month 6 of your pregnancy.  Ask for help if you have counseling or nutritional needs during pregnancy. Your health care provider can offer advice or refer you to specialists for help with various needs.  Do not use hot tubs, steam rooms, or saunas.  Do not douche or use tampons or scented sanitary pads.  Do not cross your legs for long periods of time.  Avoid cat litter boxes and soil used by cats. These carry germs that can cause birth defects in the baby and possibly loss of the fetus by miscarriage or stillbirth.  Avoid all smoking, herbs, alcohol, and medicines not prescribed by your health care provider. Chemicals in these affect the formation and growth of the baby.  Schedule a dentist appointment. At home, brush your teeth with a soft toothbrush and be gentle when you floss. SEEK MEDICAL CARE IF:   You have  dizziness.  You have mild pelvic cramps, pelvic pressure, or nagging pain in the abdominal area.  You have persistent nausea, vomiting, or diarrhea.  You have a bad smelling vaginal discharge.  You have pain with urination.  You notice increased swelling in your face, hands, legs, or ankles. SEEK IMMEDIATE MEDICAL CARE IF:   You have a fever.  You are leaking fluid from your vagina.  You have spotting or bleeding from your vagina.  You have severe abdominal cramping or pain.  You have rapid weight gain or loss.  You vomit blood or material that looks like coffee grounds.  You are exposed to German measles and have never had them.  You are exposed to fifth disease or chickenpox.  You develop a severe headache.  You have shortness of breath.  You have any kind of trauma, such as from a fall or a car accident. Document Released: 02/27/2001 Document Revised: 07/20/2013 Document Reviewed: 01/13/2013 [employer] Patient Information 2015 [employer]. This information is not intended to replace advice given to you by your health care provider. Make sure you discuss any questions you have with your health care provider.  

## 2014-08-07 LAB — DRUG SCREEN, URINE
AMPHETAMINE SCRN UR: NEGATIVE
BENZODIAZEPINES.: NEGATIVE
Barbiturate Quant, Ur: NEGATIVE
Cocaine Metabolites: NEGATIVE
Creatinine,U: 474.21 mg/dL
METHADONE: NEGATIVE
Marijuana Metabolite: NEGATIVE
Opiates: NEGATIVE
Phencyclidine (PCP): NEGATIVE
Propoxyphene: NEGATIVE

## 2014-08-07 LAB — ALCOHOL METABOLITE (ETG), URINE: ETGU: NEGATIVE ng/mL

## 2014-08-08 LAB — CULTURE, OB URINE: Colony Count: 6000

## 2014-08-09 LAB — PRENATAL PROFILE (SOLSTAS)
ANTIBODY SCREEN: NEGATIVE
BASOS ABS: 0 10*3/uL (ref 0.0–0.1)
BASOS PCT: 0 % (ref 0–1)
EOS ABS: 0.1 10*3/uL (ref 0.0–0.7)
EOS PCT: 1 % (ref 0–5)
HCT: 44 % (ref 36.0–46.0)
HIV 1&2 Ab, 4th Generation: NONREACTIVE
Hemoglobin: 14.4 g/dL (ref 12.0–15.0)
Hepatitis B Surface Ag: NEGATIVE
Lymphocytes Relative: 20 % (ref 12–46)
Lymphs Abs: 1.6 10*3/uL (ref 0.7–4.0)
MCH: 25.6 pg — AB (ref 26.0–34.0)
MCHC: 32.7 g/dL (ref 30.0–36.0)
MCV: 78.2 fL (ref 78.0–100.0)
MONO ABS: 0.5 10*3/uL (ref 0.1–1.0)
MPV: 11.1 fL (ref 8.6–12.4)
Monocytes Relative: 6 % (ref 3–12)
Neutro Abs: 5.9 10*3/uL (ref 1.7–7.7)
Neutrophils Relative %: 73 % (ref 43–77)
PLATELETS: 215 10*3/uL (ref 150–400)
RBC: 5.63 MIL/uL — ABNORMAL HIGH (ref 3.87–5.11)
RDW: 13.8 % (ref 11.5–15.5)
RH TYPE: POSITIVE
Rubella: >33 Index — ABNORMAL HIGH (ref <0.90)
WBC: 8.1 10*3/uL (ref 4.0–10.5)

## 2014-08-09 LAB — CYTOLOGY - PAP

## 2014-08-10 LAB — PRESCRIPTION MONITORING PROFILE (19 PANEL)
Amphetamine/Meth: NEGATIVE ng/mL
Barbiturate Screen, Urine: NEGATIVE ng/mL
Benzodiazepine Screen, Urine: NEGATIVE ng/mL
Buprenorphine, Urine: NEGATIVE ng/mL
CARISOPRODOL, URINE: NEGATIVE ng/mL
COCAINE METABOLITES: NEGATIVE ng/mL
Cannabinoid Scrn, Ur: NEGATIVE ng/mL
Creatinine, Urine: 474.21 mg/dL (ref >20.0)
ECSTASY: NEGATIVE ng/mL
Fentanyl, Ur: NEGATIVE ng/mL
MEPERIDINE UR: NEGATIVE ng/mL
METHADONE SCREEN, URINE: NEGATIVE ng/mL
Methaqualone: NEGATIVE ng/mL
NITRITES URINE, INITIAL: NEGATIVE ug/mL
Opiate Screen, Urine: NEGATIVE ng/mL
Oxycodone Screen, Ur: NEGATIVE ng/mL
PH URINE, INITIAL: 5.8 pH (ref 4.5–8.9)
PROPOXYPHENE: NEGATIVE ng/mL
Phencyclidine, Ur: NEGATIVE ng/mL
TAPENTADOLUR: NEGATIVE ng/mL
TRAMADOL UR: NEGATIVE ng/mL
ZOLPIDEM, URINE: NEGATIVE ng/mL

## 2014-08-18 ENCOUNTER — Encounter: Payer: Self-pay | Admitting: *Deleted

## 2014-08-18 ENCOUNTER — Telehealth: Payer: Self-pay | Admitting: *Deleted

## 2014-08-18 NOTE — Telephone Encounter (Signed)
Attempted to contact patient regarding FMLA paperwork, unable to leave a message on either phone numbers.

## 2014-08-18 NOTE — Telephone Encounter (Signed)
Received request from [employer] regarding clarification for FMLA.  According to paperwork patient has been out of work since 08/02/14.  I reviewed provider documentation and phoned [employer] and spoke with Francena HanlyStella at [phone] ext [phone].  I explained to Francena HanlyStella that patient has restrictions of no lifting equal to or greater than 20 pounds, no prolonged standing, and she needs frequent breaks (every 2 hours).  Explained patient has not been taken out of work for medical reasons at this time.  Paperwork faxed to [place] at [phone].  Copy scanned to chart under media tab.

## 2014-09-03 ENCOUNTER — Ambulatory Visit (INDEPENDENT_AMBULATORY_CARE_PROVIDER_SITE_OTHER): Payer: Self-pay | Admitting: Family Medicine

## 2014-09-03 VITALS — BP 103/84 | HR 100 | Temp 98.9°F | Wt 144.7 lb

## 2014-09-03 DIAGNOSIS — O219 Vomiting of pregnancy, unspecified: Secondary | ICD-10-CM

## 2014-09-03 DIAGNOSIS — Z3492 Encounter for supervision of normal pregnancy, unspecified, second trimester: Secondary | ICD-10-CM

## 2014-09-03 DIAGNOSIS — Z3491 Encounter for supervision of normal pregnancy, unspecified, first trimester: Secondary | ICD-10-CM

## 2014-09-03 LAB — POCT URINALYSIS DIP (DEVICE)
Glucose, UA: NEGATIVE mg/dL
Ketones, ur: NEGATIVE mg/dL
LEUKOCYTES UA: NEGATIVE
Nitrite: NEGATIVE
PROTEIN: NEGATIVE mg/dL
Specific Gravity, Urine: >=1.03 (ref 1.005–1.030)
Urobilinogen, UA: 0.2 mg/dL (ref 0.0–1.0)
pH: 5.5 (ref 5.0–8.0)

## 2014-09-03 MED ORDER — PROMETHAZINE HCL 25 MG PO TABS: 25.0000 mg | ORAL_TABLET | Freq: Four times a day (QID) | ORAL | Status: DC | PRN

## 2014-09-03 MED ORDER — DOXYLAMINE-PYRIDOXINE 10-10 MG PO TBEC: 1.0000 | DELAYED_RELEASE_TABLET | Freq: Three times a day (TID) | ORAL | Status: DC

## 2014-09-03 NOTE — Progress Notes (Signed)
Needs refill on diclegis and phenergan

## 2014-09-03 NOTE — Progress Notes (Signed)
Patient is 23 y.o. G2P1001 [redacted]w[redacted]d.  denies VB, vaginal discharge. - discussed anatomy sono, will schedule - travel: reports will be going to [place] in next few weeks, with return by July 25th (requests sono after 25th).  => discussed risks of Zika virus, importance of wearing mosquito spray and protecting herself.  Advised travel at this time is safe, however I do not advise travel to areas with documented Zika virus

## 2014-09-13 ENCOUNTER — Other Ambulatory Visit: Payer: Self-pay | Admitting: Obstetrics and Gynecology

## 2014-10-12 ENCOUNTER — Ambulatory Visit (HOSPITAL_COMMUNITY): Payer: Self-pay

## 2014-10-12 ENCOUNTER — Ambulatory Visit (INDEPENDENT_AMBULATORY_CARE_PROVIDER_SITE_OTHER): Payer: Self-pay | Admitting: Certified Nurse Midwife

## 2014-10-12 VITALS — BP 99/59 | HR 91 | Temp 99.0°F | Wt 146.0 lb

## 2014-10-12 DIAGNOSIS — Z3482 Encounter for supervision of other normal pregnancy, second trimester: Secondary | ICD-10-CM

## 2014-10-12 LAB — POCT URINALYSIS DIP (DEVICE)
Bilirubin Urine: NEGATIVE
Glucose, UA: NEGATIVE mg/dL
Hgb urine dipstick: NEGATIVE
Ketones, ur: NEGATIVE mg/dL
Leukocytes, UA: NEGATIVE
Nitrite: NEGATIVE
Protein, ur: 30 mg/dL — AB
Specific Gravity, Urine: >=1.03 (ref 1.005–1.030)
Urobilinogen, UA: 1 mg/dL (ref 0.0–1.0)
pH: 6 (ref 5.0–8.0)

## 2014-10-12 NOTE — Progress Notes (Signed)
Subjective:  Rhonda Fischer is a 24 y.o. G2P1001 at [redacted]w[redacted]d being seen today for ongoing prenatal care.  Patient reports no complaints.  Contractions: Not present.  Vag. Bleeding: None. Movement: Present. Denies leaking of fluid.   The following portions of the patient's history were reviewed and updated as appropriate: allergies, current medications, past family history, past medical history, past social history, past surgical history and problem list.   Objective:   Filed Vitals:   10/12/14 1120  BP: 99/59  Pulse: 91  Temp: 99 F (37.2 C)  Weight: 146 lb (66.225 kg)    Fetal Status: Fetal Heart Rate (bpm): 151   Movement: Present     General:  Alert, oriented and cooperative. Patient is in no acute distress.  Skin: Skin is warm and dry. No rash noted.   Cardiovascular: Normal heart rate noted  Respiratory: Normal respiratory effort, no problems with respiration noted  Abdomen: Soft, gravid, appropriate for gestational age. Pain/Pressure: Absent     Vaginal: Vag. Bleeding: None.       Cervix: Not evaluated        Extremities: Normal range of motion.  Edema: None  Mental Status: Normal mood and affect. Normal behavior. Normal judgment and thought content.   Urinalysis: Urine Protein: 1+ Urine Glucose: Negative  Assessment and Plan:  Pregnancy: G2P1001 at [redacted]w[redacted]d  There are no diagnoses linked to this encounter. Preterm labor symptoms and general obstetric precautions including but not limited to vaginal bleeding, contractions, leaking of fluid and fetal movement were reviewed in detail with the patient. Please refer to After Visit Summary for other counseling recommendations.  Return in about 4 weeks (around 11/09/2014).   Rhea Pink, CNM

## 2014-10-12 NOTE — Progress Notes (Signed)
Pt reports going to [place] on July 4th returning on July 20th

## 2014-10-12 NOTE — Patient Instructions (Signed)
Second Trimester of Pregnancy The second trimester is from week 13 through week 28, months 4 through 6. The second trimester is often a time when you feel your best. Your body has also adjusted to being pregnant, and you begin to feel better physically. Usually, morning sickness has lessened or quit completely, you may have more energy, and you may have an increase in appetite. The second trimester is also a time when the fetus is growing rapidly. At the end of the sixth month, the fetus is about 9 inches long and weighs about 1 pounds. You will likely begin to feel the baby move (quickening) between 18 and 20 weeks of the pregnancy. BODY CHANGES Your body goes through many changes during pregnancy. The changes vary from woman to woman.   Your weight will continue to increase. You will notice your lower abdomen bulging out.  You may begin to get stretch marks on your hips, abdomen, and breasts.  You may develop headaches that can be relieved by medicines approved by your health care provider.  You may urinate more often because the fetus is pressing on your bladder.  You may develop or continue to have heartburn as a result of your pregnancy.  You may develop constipation because certain hormones are causing the muscles that push waste through your intestines to slow down.  You may develop hemorrhoids or swollen, bulging veins (varicose veins).  You may have back pain because of the weight gain and pregnancy hormones relaxing your joints between the bones in your pelvis and as a result of a shift in weight and the muscles that support your balance.  Your breasts will continue to grow and be tender.  Your gums may bleed and may be sensitive to brushing and flossing.  Dark spots or blotches (chloasma, mask of pregnancy) may develop on your face. This will likely fade after the baby is born.  A dark line from your belly button to the pubic area (linea nigra) may appear. This will likely fade  after the baby is born.  You may have changes in your hair. These can include thickening of your hair, rapid growth, and changes in texture. Some women also have hair loss during or after pregnancy, or hair that feels dry or thin. Your hair will most likely return to normal after your baby is born. WHAT TO EXPECT AT YOUR PRENATAL VISITS During a routine prenatal visit:  You will be weighed to make sure you and the fetus are growing normally.  Your blood pressure will be taken.  Your abdomen will be measured to track your baby's growth.  The fetal heartbeat will be listened to.  Any test results from the previous visit will be discussed. Your health care provider may ask you:  How you are feeling.  If you are feeling the baby move.  If you have had any abnormal symptoms, such as leaking fluid, bleeding, severe headaches, or abdominal cramping.  If you have any questions. Other tests that may be performed during your second trimester include:  Blood tests that check for:  Low iron levels (anemia).  Gestational diabetes (between 24 and 28 weeks).  Rh antibodies.  Urine tests to check for infections, diabetes, or protein in the urine.  An ultrasound to confirm the proper growth and development of the baby.  An amniocentesis to check for possible genetic problems.  Fetal screens for spina bifida and Down syndrome. HOME CARE INSTRUCTIONS   Avoid all smoking, herbs, alcohol, and unprescribed   drugs. These chemicals affect the formation and growth of the baby.  Follow your health care provider's instructions regarding medicine use. There are medicines that are either safe or unsafe to take during pregnancy.  Exercise only as directed by your health care provider. Experiencing uterine cramps is a good sign to stop exercising.  Continue to eat regular, healthy meals.  Wear a good support bra for breast tenderness.  Do not use hot tubs, steam rooms, or saunas.  Wear your  seat belt at all times when driving.  Avoid raw meat, uncooked cheese, cat litter boxes, and soil used by cats. These carry germs that can cause birth defects in the baby.  Take your prenatal vitamins.  Try taking a stool softener (if your health care provider approves) if you develop constipation. Eat more high-fiber foods, such as fresh vegetables or fruit and whole grains. Drink plenty of fluids to keep your urine clear or pale yellow.  Take warm sitz baths to soothe any pain or discomfort caused by hemorrhoids. Use hemorrhoid cream if your health care provider approves.  If you develop varicose veins, wear support hose. Elevate your feet for 15 minutes, 3-4 times a day. Limit salt in your diet.  Avoid heavy lifting, wear low heel shoes, and practice good posture.  Rest with your legs elevated if you have leg cramps or low back pain.  Visit your dentist if you have not gone yet during your pregnancy. Use a soft toothbrush to brush your teeth and be gentle when you floss.  A sexual relationship may be continued unless your health care provider directs you otherwise.  Continue to go to all your prenatal visits as directed by your health care provider. SEEK MEDICAL CARE IF:   You have dizziness.  You have mild pelvic cramps, pelvic pressure, or nagging pain in the abdominal area.  You have persistent nausea, vomiting, or diarrhea.  You have a bad smelling vaginal discharge.  You have pain with urination. SEEK IMMEDIATE MEDICAL CARE IF:   You have a fever.  You are leaking fluid from your vagina.  You have spotting or bleeding from your vagina.  You have severe abdominal cramping or pain.  You have rapid weight gain or loss.  You have shortness of breath with chest pain.  You notice sudden or extreme swelling of your face, hands, ankles, feet, or legs.  You have not felt your baby move in over an hour.  You have severe headaches that do not go away with  medicine.  You have vision changes. Document Released: 02/27/2001 Document Revised: 03/10/2013 Document Reviewed: 05/06/2012 [employer] Patient Information 2015 [employer]. This information is not intended to replace advice given to you by your health care provider. Make sure you discuss any questions you have with your health care provider.  

## 2014-10-25 ENCOUNTER — Ambulatory Visit (HOSPITAL_COMMUNITY)
Admission: RE | Admit: 2014-10-25 | Discharge: 2014-10-25 | Disposition: A | Discharge status: Home / Self Care | Payer: Self-pay | Source: Ambulatory Visit | Attending: Certified Nurse Midwife | Admitting: Certified Nurse Midwife

## 2014-10-25 DIAGNOSIS — Z3491 Encounter for supervision of normal pregnancy, unspecified, first trimester: Secondary | ICD-10-CM

## 2014-10-25 DIAGNOSIS — Z3492 Encounter for supervision of normal pregnancy, unspecified, second trimester: Secondary | ICD-10-CM | POA: Insufficient documentation

## 2014-11-09 ENCOUNTER — Encounter: Payer: Self-pay | Admitting: Family Medicine

## 2014-11-30 ENCOUNTER — Encounter: Payer: Self-pay | Admitting: Advanced Practice Midwife

## 2014-11-30 ENCOUNTER — Ambulatory Visit (INDEPENDENT_AMBULATORY_CARE_PROVIDER_SITE_OTHER): Payer: Medicaid Other | Admitting: Advanced Practice Midwife

## 2014-11-30 VITALS — BP 125/72 | HR 98 | Temp 99.1°F | Wt 150.7 lb

## 2014-11-30 DIAGNOSIS — Z23 Encounter for immunization: Secondary | ICD-10-CM

## 2014-11-30 DIAGNOSIS — Z3491 Encounter for supervision of normal pregnancy, unspecified, first trimester: Secondary | ICD-10-CM

## 2014-11-30 DIAGNOSIS — O219 Vomiting of pregnancy, unspecified: Secondary | ICD-10-CM | POA: Diagnosis not present

## 2014-11-30 DIAGNOSIS — Z3493 Encounter for supervision of normal pregnancy, unspecified, third trimester: Secondary | ICD-10-CM

## 2014-11-30 DIAGNOSIS — O0990 Supervision of high risk pregnancy, unspecified, unspecified trimester: Secondary | ICD-10-CM | POA: Insufficient documentation

## 2014-11-30 DIAGNOSIS — Z3492 Encounter for supervision of normal pregnancy, unspecified, second trimester: Secondary | ICD-10-CM

## 2014-11-30 LAB — CBC
HCT: 36.6 % (ref 36.0–46.0)
HEMOGLOBIN: 12.2 g/dL (ref 12.0–15.0)
MCH: 26.2 pg (ref 26.0–34.0)
MCHC: 33.3 g/dL (ref 30.0–36.0)
MCV: 78.5 fL (ref 78.0–100.0)
MPV: 10.8 fL (ref 8.6–12.4)
PLATELETS: 179 10*3/uL (ref 150–400)
RBC: 4.66 MIL/uL (ref 3.87–5.11)
RDW: 14.7 % (ref 11.5–15.5)
WBC: 12 10*3/uL — ABNORMAL HIGH (ref 4.0–10.5)

## 2014-11-30 LAB — POCT URINALYSIS DIP (DEVICE)
BILIRUBIN URINE: NEGATIVE
Glucose, UA: NEGATIVE mg/dL
Ketones, ur: NEGATIVE mg/dL
LEUKOCYTES UA: NEGATIVE
NITRITE: NEGATIVE
Protein, ur: NEGATIVE mg/dL
Specific Gravity, Urine: 1.02 (ref 1.005–1.030)
Urobilinogen, UA: 0.2 mg/dL (ref 0.0–1.0)
pH: 7 (ref 5.0–8.0)

## 2014-11-30 MED ORDER — TETANUS-DIPHTH-ACELL PERTUSSIS 5-2.5-18.5 LF-MCG/0.5 IM SUSP
0.5000 mL | Freq: Once | INTRAMUSCULAR | Status: AC
  Administered 2014-11-30: 0.5 mL via INTRAMUSCULAR

## 2014-11-30 MED ORDER — DOXYLAMINE-PYRIDOXINE 10-10 MG PO TBEC: 1.0000 | DELAYED_RELEASE_TABLET | Freq: Three times a day (TID) | ORAL | Status: DC

## 2014-11-30 MED ORDER — PRENATAL 27-1 MG PO TABS: 1.0000 | ORAL_TABLET | Freq: Every day | ORAL | Status: DC

## 2014-11-30 NOTE — Patient Instructions (Signed)
Third Trimester of Pregnancy The third trimester is from week 29 through week 42, months 7 through 9. The third trimester is a time when the fetus is growing rapidly. At the end of the ninth month, the fetus is about 20 inches in length and weighs 6-10 pounds.  BODY CHANGES Your body goes through many changes during pregnancy. The changes vary from woman to woman.   Your weight will continue to increase. You can expect to gain 25-35 pounds (11-16 kg) by the end of the pregnancy.  You may begin to get stretch marks on your hips, abdomen, and breasts.  You may urinate more often because the fetus is moving lower into your pelvis and pressing on your bladder.  You may develop or continue to have heartburn as a result of your pregnancy.  You may develop constipation because certain hormones are causing the muscles that push waste through your intestines to slow down.  You may develop hemorrhoids or swollen, bulging veins (varicose veins).  You may have pelvic pain because of the weight gain and pregnancy hormones relaxing your joints between the bones in your pelvis. Backaches may result from overexertion of the muscles supporting your posture.  You may have changes in your hair. These can include thickening of your hair, rapid growth, and changes in texture. Some women also have hair loss during or after pregnancy, or hair that feels dry or thin. Your hair will most likely return to normal after your baby is born.  Your breasts will continue to grow and be tender. A yellow discharge may leak from your breasts called colostrum.  Your belly button may stick out.  You may feel short of breath because of your expanding uterus.  You may notice the fetus "dropping," or moving lower in your abdomen.  You may have a bloody mucus discharge. This usually occurs a few days to a week before labor begins.  Your cervix becomes thin and soft (effaced) near your due date. WHAT TO EXPECT AT YOUR PRENATAL  EXAMS  You will have prenatal exams every 2 weeks until week 36. Then, you will have weekly prenatal exams. During a routine prenatal visit:  You will be weighed to make sure you and the fetus are growing normally.  Your blood pressure is taken.  Your abdomen will be measured to track your baby's growth.  The fetal heartbeat will be listened to.  Any test results from the previous visit will be discussed.  You may have a cervical check near your due date to see if you have effaced. At around 36 weeks, your caregiver will check your cervix. At the same time, your caregiver will also perform a test on the secretions of the vaginal tissue. This test is to determine if a type of bacteria, Group B streptococcus, is present. Your caregiver will explain this further. Your caregiver may ask you:  What your birth plan is.  How you are feeling.  If you are feeling the baby move.  If you have had any abnormal symptoms, such as leaking fluid, bleeding, severe headaches, or abdominal cramping.  If you have any questions. Other tests or screenings that may be performed during your third trimester include:  Blood tests that check for low iron levels (anemia).  Fetal testing to check the health, activity level, and growth of the fetus. Testing is done if you have certain medical conditions or if there are problems during the pregnancy. FALSE LABOR You may feel small, irregular contractions that   eventually go away. These are called Braxton Hicks contractions, or false labor. Contractions may last for hours, days, or even weeks before true labor sets in. If contractions come at regular intervals, intensify, or become painful, it is best to be seen by your caregiver.  SIGNS OF LABOR   Menstrual-like cramps.  Contractions that are 5 minutes apart or less.  Contractions that start on the top of the uterus and spread down to the lower abdomen and back.  A sense of increased pelvic pressure or back  pain.  A watery or bloody mucus discharge that comes from the vagina. If you have any of these signs before the 37th week of pregnancy, call your caregiver right away. You need to go to the hospital to get checked immediately. HOME CARE INSTRUCTIONS   Avoid all smoking, herbs, alcohol, and unprescribed drugs. These chemicals affect the formation and growth of the baby.  Follow your caregiver's instructions regarding medicine use. There are medicines that are either safe or unsafe to take during pregnancy.  Exercise only as directed by your caregiver. Experiencing uterine cramps is a good sign to stop exercising.  Continue to eat regular, healthy meals.  Wear a good support bra for breast tenderness.  Do not use hot tubs, steam rooms, or saunas.  Wear your seat belt at all times when driving.  Avoid raw meat, uncooked cheese, cat litter boxes, and soil used by cats. These carry germs that can cause birth defects in the baby.  Take your prenatal vitamins.  Try taking a stool softener (if your caregiver approves) if you develop constipation. Eat more high-fiber foods, such as fresh vegetables or fruit and whole grains. Drink plenty of fluids to keep your urine clear or pale yellow.  Take warm sitz baths to soothe any pain or discomfort caused by hemorrhoids. Use hemorrhoid cream if your caregiver approves.  If you develop varicose veins, wear support hose. Elevate your feet for 15 minutes, 3-4 times a day. Limit salt in your diet.  Avoid heavy lifting, wear low heal shoes, and practice good posture.  Rest a lot with your legs elevated if you have leg cramps or low back pain.  Visit your dentist if you have not gone during your pregnancy. Use a soft toothbrush to brush your teeth and be gentle when you floss.  A sexual relationship may be continued unless your caregiver directs you otherwise.  Do not travel far distances unless it is absolutely necessary and only with the approval  of your caregiver.  Take prenatal classes to understand, practice, and ask questions about the labor and delivery.  Make a trial run to the hospital.  Pack your hospital bag.  Prepare the baby's nursery.  Continue to go to all your prenatal visits as directed by your caregiver. SEEK MEDICAL CARE IF:  You are unsure if you are in labor or if your water has broken.  You have dizziness.  You have mild pelvic cramps, pelvic pressure, or nagging pain in your abdominal area.  You have persistent nausea, vomiting, or diarrhea.  You have a bad smelling vaginal discharge.  You have pain with urination. SEEK IMMEDIATE MEDICAL CARE IF:   You have a fever.  You are leaking fluid from your vagina.  You have spotting or bleeding from your vagina.  You have severe abdominal cramping or pain.  You have rapid weight loss or gain.  You have shortness of breath with chest pain.  You notice sudden or extreme swelling   of your face, hands, ankles, feet, or legs.  You have not felt your baby move in over an hour.  You have severe headaches that do not go away with medicine.  You have vision changes. Document Released: 02/27/2001 Document Revised: 03/10/2013 Document Reviewed: 05/06/2012 [employer] Patient Information 2015 [employer]. This information is not intended to replace advice given to you by your health care provider. Make sure you discuss any questions you have with your health care provider.  

## 2014-11-30 NOTE — Progress Notes (Signed)
Discussed flu and tdap- states she wants to discuss with husband first.

## 2014-11-30 NOTE — Progress Notes (Signed)
Subjective:  Rhonda Fischer is a 24 y.o. G2P1001 at [redacted]w[redacted]d being seen today for ongoing prenatal care.  Patient reports nausea and Only in the mornings.  Contractions: Not present.  Vag. Bleeding: None. Movement: Present. Denies leaking of fluid.   The following portions of the patient's history were reviewed and updated as appropriate: allergies, current medications, past family history, past medical history, past social history, past surgical history and problem list.   Objective:   Filed Vitals:   11/30/14 1401  BP: 125/72  Pulse: 98  Temp: 99.1 F (37.3 C)  Weight: 150 lb 11.2 oz (68.357 kg)    Fetal Status: Fetal Heart Rate (bpm): 142   Movement: Present     General:  Alert, oriented and cooperative. Patient is in no acute distress.  Skin: Skin is warm and dry. No rash noted.   Cardiovascular: Normal heart rate noted  Respiratory: Normal respiratory effort, no problems with respiration noted  Abdomen: Soft, gravid, appropriate for gestational age. Pain/Pressure: Absent     Pelvic: Vag. Bleeding: None     Cervical exam deferred        Extremities: Normal range of motion.  Edema: None  Mental Status: Normal mood and affect. Normal behavior. Normal judgment and thought content.   Urinalysis: Urine Protein: Negative Urine Glucose: Negative  Assessment and Plan:  Pregnancy: G2P1001 at [redacted]w[redacted]d  1. Supervision of normal pregnancy, second trimester  - Glucose Tolerance, 1 HR (50g) w/o Fasting - CBC - RPR - HIV antibody (with reflex) - Flu Vaccine QUAD 36+ mos IM; Standing - Flu Vaccine QUAD 36+ mos IM - POCT urinalysis dip (device) - PRENATAL 27-1 MG TABS; Take 1 tablet by mouth daily.  Dispense: 30 tablet; Refill: 6  2. Nausea and vomiting during pregnancy     Refill ordered for Diclegis  10-10 MG TBEC; Take 1 tablet by mouth 3 (three) times daily.  Dispense: 100 tablet; Refill: 0  5. Need for Tdap vaccination      - Tdap (BOOSTRIX) injection 0.5 mL; Inject 0.5 mLs into  the muscle once.  Preterm labor symptoms and general obstetric precautions including but not limited to vaginal bleeding, contractions, leaking of fluid and fetal movement were reviewed in detail with the patient. Please refer to After Visit Summary for other counseling recommendations.  Return in about 2 weeks (around 12/14/2014) for [facility].   Aviva Signs, CNM

## 2014-12-01 LAB — HIV ANTIBODY (ROUTINE TESTING W REFLEX): HIV 1&2 Ab, 4th Generation: NONREACTIVE

## 2014-12-01 LAB — GLUCOSE TOLERANCE, 1 HOUR (50G) W/O FASTING: GLUCOSE 1 HOUR GTT: 93 mg/dL (ref 70–140)

## 2014-12-02 LAB — RPR

## 2014-12-03 ENCOUNTER — Other Ambulatory Visit: Payer: Self-pay | Admitting: Advanced Practice Midwife

## 2014-12-03 ENCOUNTER — Ambulatory Visit (HOSPITAL_COMMUNITY)
Admission: RE | Admit: 2014-12-03 | Discharge: 2014-12-03 | Disposition: A | Discharge status: Home / Self Care | Payer: Self-pay | Source: Ambulatory Visit | Attending: Advanced Practice Midwife | Admitting: Advanced Practice Midwife

## 2014-12-03 ENCOUNTER — Encounter (HOSPITAL_COMMUNITY): Payer: Self-pay

## 2014-12-03 DIAGNOSIS — Z1389 Encounter for screening for other disorder: Secondary | ICD-10-CM

## 2014-12-03 DIAGNOSIS — O26873 Cervical shortening, third trimester: Secondary | ICD-10-CM

## 2014-12-03 DIAGNOSIS — Z3A28 28 weeks gestation of pregnancy: Secondary | ICD-10-CM | POA: Insufficient documentation

## 2014-12-03 DIAGNOSIS — Z3492 Encounter for supervision of normal pregnancy, unspecified, second trimester: Secondary | ICD-10-CM | POA: Insufficient documentation

## 2014-12-07 ENCOUNTER — Encounter: Payer: Self-pay | Admitting: Family Medicine

## 2014-12-07 DIAGNOSIS — O26873 Cervical shortening, third trimester: Secondary | ICD-10-CM | POA: Insufficient documentation

## 2014-12-16 ENCOUNTER — Encounter: Payer: Self-pay | Admitting: Family Medicine

## 2014-12-16 ENCOUNTER — Ambulatory Visit (INDEPENDENT_AMBULATORY_CARE_PROVIDER_SITE_OTHER): Payer: Medicaid Other | Admitting: Family Medicine

## 2014-12-16 VITALS — BP 108/66 | HR 62 | Temp 99.0°F | Wt 152.7 lb

## 2014-12-16 DIAGNOSIS — O0993 Supervision of high risk pregnancy, unspecified, third trimester: Secondary | ICD-10-CM | POA: Diagnosis not present

## 2014-12-16 DIAGNOSIS — Z23 Encounter for immunization: Secondary | ICD-10-CM | POA: Diagnosis not present

## 2014-12-16 DIAGNOSIS — R81 Glycosuria: Secondary | ICD-10-CM

## 2014-12-16 LAB — POCT URINALYSIS DIP (DEVICE)
BILIRUBIN URINE: NEGATIVE
GLUCOSE, UA: 500 mg/dL — AB
Ketones, ur: NEGATIVE mg/dL
Leukocytes, UA: NEGATIVE
NITRITE: NEGATIVE
PH: 6.5 (ref 5.0–8.0)
PROTEIN: NEGATIVE mg/dL
Specific Gravity, Urine: >=1.03 (ref 1.005–1.030)
Urobilinogen, UA: 0.2 mg/dL (ref 0.0–1.0)

## 2014-12-16 LAB — GLUCOSE, CAPILLARY: GLUCOSE-CAPILLARY: 67 mg/dL (ref 65–99)

## 2014-12-16 NOTE — Progress Notes (Signed)
Breastfeeding tip of the week reviewed Flu today

## 2014-12-16 NOTE — Progress Notes (Signed)
Subjective:   Rhonda Fischer is a 24 y.o. G2P1001 at [redacted]w[redacted]d being seen today for her obstetrical visit.  Patient reports no complaints.  Contractions: Not present.  Vag. Bleeding: None.  Fetal Movement: Present. Denies leaking of fluid.   The following portions of the patient's history were reviewed and updated as appropriate: allergies, current medications, past family history, past medical history, past social history, past surgical history and problem list.   Objective:  BP 108/66 mmHg  Pulse 62  Temp(Src) 99 F (37.2 C)  Wt 152 lb 11.2 oz (69.264 kg)  LMP 06/01/2014  Fetal Status: Fetal Heart Rate (bpm): 143 Fundal Height: 31 cm Movement: Present     General:  Alert, oriented and cooperative. Patient is in no acute distress.  Skin: Skin is warm and dry. No rash noted.   Cardiovascular: Normal heart rate noted, normal rhythm  Respiratory: Effort and breath sounds normal, no problems with respiration noted  Abdomen: Soft, gravid, appropriate for gestational age. Pain/Pressure: Absent     Vaginal: Vag. Bleeding: None.       Cervix: Deferred  Extremities: Normal range of motion.  Edema: None  Mental Status: Normal mood and affect. Normal behavior. Normal judgment and thought content.   Urinalysis: Urine Protein: Negative Urine Glucose: 3+   Assessment and Plan:   Pregnancy: G2P1001 at [redacted]w[redacted]d  1. Needs flu shot - Flu Vaccine QUAD 36+ mos IM; Standing - Flu Vaccine QUAD 36+ mos IM  2. Supervision of high risk pregnancy, antepartum, third trimester. Short cervix.  - 28 week labs reviewed with patient - Glucosuria noted, however POCT CBG 67 today. Will follow up at next OB visit.   Labor symptoms and precations: vaginal bleeding, contractions and leaking of fluid reviewed in detail.  Fetal movement precautions also reviewed. Please refer to After Visit Summary for other counseling recommendations.  Return in about 2 weeks (around 12/30/2014).   Ardith Dark, MD

## 2014-12-16 NOTE — Progress Notes (Signed)
Attestation of Attending Supervision of Resident: Evaluation and management procedures were performed by the Resident under my supervision and collaboration.  I have seen and examined the patient.  I agree with the Resident's note and Assessment and Plan.  Jacob Stinson, DO Attending Physician Faculty Practice, [facility]  

## 2014-12-30 ENCOUNTER — Ambulatory Visit (INDEPENDENT_AMBULATORY_CARE_PROVIDER_SITE_OTHER): Payer: Self-pay | Admitting: Certified Nurse Midwife

## 2014-12-30 VITALS — BP 107/67 | HR 99 | Wt 153.0 lb

## 2014-12-30 DIAGNOSIS — O09893 Supervision of other high risk pregnancies, third trimester: Secondary | ICD-10-CM

## 2014-12-30 NOTE — Progress Notes (Signed)
Reviewed breastfeeding tip of the week.  

## 2014-12-30 NOTE — Patient Instructions (Signed)

## 2014-12-30 NOTE — Progress Notes (Signed)
Subjective:  Rhonda Fischer is a 24 y.o. G2P1001 at 528w3d being seen today for ongoing prenatal care.  Patient reports no complaints.  Contractions: Not present.  Vag. Bleeding: None. Movement: Present. Denies leaking of fluid.   The following portions of the patient's history were reviewed and updated as appropriate: allergies, current medications, past family history, past medical history, past social history, past surgical history and problem list. Problem list updated.  Objective:   Filed Vitals:   12/30/14 1425  BP: 107/67  Pulse: 99  Weight: 153 lb (69.4 kg)    Fetal Status: Fetal Heart Rate (bpm): 152   Movement: Present     General:  Alert, oriented and cooperative. Patient is in no acute distress.  Skin: Skin is warm and dry. No rash noted.   Cardiovascular: Normal heart rate noted  Respiratory: Normal respiratory effort, no problems with respiration noted  Abdomen: Soft, gravid, appropriate for gestational age. Pain/Pressure: Absent     Pelvic: Vag. Bleeding: None     Cervical exam deferred        Extremities: Normal range of motion.  Edema: None  Mental Status: Normal mood and affect. Normal behavior. Normal judgment and thought content.   Urinalysis:      Assessment and Plan:  Pregnancy: G2P1001 at 238w3d  There are no diagnoses linked to this encounter. Preterm labor symptoms and general obstetric precautions including but not limited to vaginal bleeding, contractions, leaking of fluid and fetal movement were reviewed in detail with the patient. Please refer to After Visit Summary for other counseling recommendations.  Return in about 2 weeks (around 01/13/2015).   Rhea PinkLori A Clemmons, CNM

## 2015-01-12 ENCOUNTER — Ambulatory Visit (INDEPENDENT_AMBULATORY_CARE_PROVIDER_SITE_OTHER): Payer: Medicaid Other | Admitting: Advanced Practice Midwife

## 2015-01-12 ENCOUNTER — Encounter: Payer: Self-pay | Admitting: Obstetrics and Gynecology

## 2015-01-12 VITALS — BP 99/50 | HR 88 | Temp 99.0°F | Wt 153.7 lb

## 2015-01-12 DIAGNOSIS — O09893 Supervision of other high risk pregnancies, third trimester: Secondary | ICD-10-CM

## 2015-01-12 LAB — POCT URINALYSIS DIP (DEVICE)
BILIRUBIN URINE: NEGATIVE
GLUCOSE, UA: NEGATIVE mg/dL
LEUKOCYTES UA: NEGATIVE
Nitrite: NEGATIVE
Protein, ur: 30 mg/dL — AB
SPECIFIC GRAVITY, URINE: 1.02 (ref 1.005–1.030)
Urobilinogen, UA: 0.2 mg/dL (ref 0.0–1.0)
pH: 7.5 (ref 5.0–8.0)

## 2015-01-12 MED ORDER — PROMETHAZINE HCL 25 MG PO TABS: 12.5000 mg | ORAL_TABLET | Freq: Four times a day (QID) | ORAL | Status: DC | PRN

## 2015-01-12 NOTE — Progress Notes (Signed)
Subjective:  Rhonda Fischer is a 24 y.o. G2P1001 at 8474w2d being seen today for ongoing prenatal care.  Patient reports no complaints.  Contractions: Not present.  Vag. Bleeding: None. Movement: Present. Denies leaking of fluid.   The following portions of the patient's history were reviewed and updated as appropriate: allergies, current medications, past family history, past medical history, past social history, past surgical history and problem list. Problem list updated.  Objective:   Filed Vitals:   01/12/15 1445  BP: 99/50  Pulse: 88  Temp: 99 F (37.2 C)  Weight: 153 lb 11.2 oz (69.718 kg)    Fetal Status: Fetal Heart Rate (bpm): 140 Fundal Height: 33 cm Movement: Present     General:  Alert, oriented and cooperative. Patient is in no acute distress.  Skin: Skin is warm and dry. No rash noted.   Cardiovascular: Normal heart rate noted  Respiratory: Normal respiratory effort, no problems with respiration noted  Abdomen: Soft, gravid, appropriate for gestational age. Pain/Pressure: Absent     Pelvic: Vag. Bleeding: None     Cervical exam deferred        Extremities: Normal range of motion.  Edema: None  Mental Status: Normal mood and affect. Normal behavior. Normal judgment and thought content.   Urinalysis:      Assessment and Plan:  Pregnancy: G2P1001 at 6374w2d  There are no diagnoses linked to this encounter. Preterm labor symptoms and general obstetric precautions including but not limited to vaginal bleeding, contractions, leaking of fluid and fetal movement were reviewed in detail with the patient. Please refer to After Visit Summary for other counseling recommendations.  Return in about 2 weeks (around 01/26/2015).   Hurshel PartyLisa A Leftwich-Kirby, CNM

## 2015-01-12 NOTE — Progress Notes (Addendum)
Pt desires refill of Phenergan because Diclegis is too expensive.  Breastfeeding tip of the week reviewed. Husband present for encounter. Both husband and pt decline hospital provided interpreter or the use of Language line for translation.  Waiver signed 05/28/12.   Pt understands some English.  Husband providing interpretation today.

## 2015-01-26 ENCOUNTER — Ambulatory Visit (INDEPENDENT_AMBULATORY_CARE_PROVIDER_SITE_OTHER): Payer: Medicaid Other | Admitting: Student

## 2015-01-26 VITALS — BP 104/86 | HR 110 | Temp 99.2°F | Wt 156.3 lb

## 2015-01-26 DIAGNOSIS — O0993 Supervision of high risk pregnancy, unspecified, third trimester: Secondary | ICD-10-CM | POA: Diagnosis not present

## 2015-01-26 DIAGNOSIS — Z113 Encounter for screening for infections with a predominantly sexual mode of transmission: Secondary | ICD-10-CM | POA: Diagnosis not present

## 2015-01-26 LAB — OB RESULTS CONSOLE GBS: GBS: NEGATIVE

## 2015-01-26 NOTE — Progress Notes (Signed)
Subjective:  Rhonda Fischer is a 24 y.o. G2P1001 at 6185w2d being seen today for ongoing prenatal care.  Patient reports no complaints.  Contractions: Not present.  Vag. Bleeding: None. Movement: Present. Denies leaking of fluid.   The following portions of the patient's history were reviewed and updated as appropriate: allergies, current medications, past family history, past medical history, past social history, past surgical history and problem list. Problem list updated.  Objective:   Filed Vitals:   01/26/15 1415  BP: 104/86  Pulse: 110  Temp: 99.2 F (37.3 C)  Weight: 156 lb 4.8 oz (70.897 kg)    Fetal Status: Fetal Heart Rate (bpm): 147   Movement: Present     General:  Alert, oriented and cooperative. Patient is in no acute distress.  Skin: Skin is warm and dry. No rash noted.   Cardiovascular: Normal heart rate noted  Respiratory: Normal respiratory effort, no problems with respiration noted  Abdomen: Soft, gravid, appropriate for gestational age. Pain/Pressure: Present   Fundal ht 35cm  Pelvic: Vag. Bleeding: None     Cervical exam performed      0/50/-3 vertex  Extremities: Normal range of motion.  Edema: None  Mental Status: Normal mood and affect. Normal behavior. Normal judgment and thought content.   Urinalysis:      Assessment and Plan:  Pregnancy: G2P1001 at 4685w2d  1. Supervision of high risk pregnancy, antepartum, third trimester   Term labor symptoms and general obstetric precautions including but not limited to vaginal bleeding, contractions, leaking of fluid and fetal movement were reviewed in detail with the patient. Please refer to After Visit Summary for other counseling recommendations.  Return in about 1 week (around 02/02/2015).   Judeth HornErin Lillyen Schow, NP

## 2015-01-26 NOTE — Patient Instructions (Signed)
Fetal Movement Counts Patient Name: __________________________________________________ Patient Due Date: ____________________ Performing a fetal movement count is highly recommended in high-risk pregnancies, but it is good for every pregnant woman to do. Your health care provider may ask you to start counting fetal movements at 28 weeks of the pregnancy. Fetal movements often increase:  After eating a full meal.  After physical activity.  After eating or drinking something sweet or cold.  At rest. Pay attention to when you feel the baby is most active. This will help you notice a pattern of your baby's sleep and wake cycles and what factors contribute to an increase in fetal movement. It is important to perform a fetal movement count at the same time each day when your baby is normally most active.  HOW TO COUNT FETAL MOVEMENTS 1. Find a quiet and comfortable area to sit or lie down on your left side. Lying on your left side provides the best blood and oxygen circulation to your baby. 2. Write down the day and time on a sheet of paper or in a journal. 3. Start counting kicks, flutters, swishes, rolls, or jabs in a 2-hour period. You should feel at least 10 movements within 2 hours. 4. If you do not feel 10 movements in 2 hours, wait 2-3 hours and count again. Look for a change in the pattern or not enough counts in 2 hours. SEEK MEDICAL CARE IF:  You feel less than 10 counts in 2 hours, tried twice.  There is no movement in over an hour.  The pattern is changing or taking longer each day to reach 10 counts in 2 hours.  You feel the baby is not moving as he or she usually does. Date: ____________ Movements: ____________ Start time: ____________ Finish time: ____________  Date: ____________ Movements: ____________ Start time: ____________ Finish time: ____________ Date: ____________ Movements: ____________ Start time: ____________ Finish time: ____________ Date: ____________ Movements:  ____________ Start time: ____________ Finish time: ____________ Date: ____________ Movements: ____________ Start time: ____________ Finish time: ____________ Date: ____________ Movements: ____________ Start time: ____________ Finish time: ____________ Date: ____________ Movements: ____________ Start time: ____________ Finish time: ____________ Date: ____________ Movements: ____________ Start time: ____________ Finish time: ____________  Date: ____________ Movements: ____________ Start time: ____________ Finish time: ____________ Date: ____________ Movements: ____________ Start time: ____________ Finish time: ____________ Date: ____________ Movements: ____________ Start time: ____________ Finish time: ____________ Date: ____________ Movements: ____________ Start time: ____________ Finish time: ____________ Date: ____________ Movements: ____________ Start time: ____________ Finish time: ____________ Date: ____________ Movements: ____________ Start time: ____________ Finish time: ____________ Date: ____________ Movements: ____________ Start time: ____________ Finish time: ____________  Date: ____________ Movements: ____________ Start time: ____________ Finish time: ____________ Date: ____________ Movements: ____________ Start time: ____________ Finish time: ____________ Date: ____________ Movements: ____________ Start time: ____________ Finish time: ____________ Date: ____________ Movements: ____________ Start time: ____________ Finish time: ____________ Date: ____________ Movements: ____________ Start time: ____________ Finish time: ____________ Date: ____________ Movements: ____________ Start time: ____________ Finish time: ____________ Date: ____________ Movements: ____________ Start time: ____________ Finish time: ____________  Date: ____________ Movements: ____________ Start time: ____________ Finish time: ____________ Date: ____________ Movements: ____________ Start time: ____________ Finish  time: ____________ Date: ____________ Movements: ____________ Start time: ____________ Finish time: ____________ Date: ____________ Movements: ____________ Start time: ____________ Finish time: ____________ Date: ____________ Movements: ____________ Start time: ____________ Finish time: ____________ Date: ____________ Movements: ____________ Start time: ____________ Finish time: ____________ Date: ____________ Movements: ____________ Start time: ____________ Finish time: ____________  Date: ____________ Movements: ____________ Start time: ____________ Finish   time: ____________ Date: ____________ Movements: ____________ Start time: ____________ Finish time: ____________ Date: ____________ Movements: ____________ Start time: ____________ Finish time: ____________ Date: ____________ Movements: ____________ Start time: ____________ Finish time: ____________ Date: ____________ Movements: ____________ Start time: ____________ Finish time: ____________ Date: ____________ Movements: ____________ Start time: ____________ Finish time: ____________ Date: ____________ Movements: ____________ Start time: ____________ Finish time: ____________  Date: ____________ Movements: ____________ Start time: ____________ Finish time: ____________ Date: ____________ Movements: ____________ Start time: ____________ Finish time: ____________ Date: ____________ Movements: ____________ Start time: ____________ Finish time: ____________ Date: ____________ Movements: ____________ Start time: ____________ Finish time: ____________ Date: ____________ Movements: ____________ Start time: ____________ Finish time: ____________ Date: ____________ Movements: ____________ Start time: ____________ Finish time: ____________ Date: ____________ Movements: ____________ Start time: ____________ Finish time: ____________  Date: ____________ Movements: ____________ Start time: ____________ Finish time: ____________ Date: ____________  Movements: ____________ Start time: ____________ Finish time: ____________ Date: ____________ Movements: ____________ Start time: ____________ Finish time: ____________ Date: ____________ Movements: ____________ Start time: ____________ Finish time: ____________ Date: ____________ Movements: ____________ Start time: ____________ Finish time: ____________ Date: ____________ Movements: ____________ Start time: ____________ Finish time: ____________ Date: ____________ Movements: ____________ Start time: ____________ Finish time: ____________  Date: ____________ Movements: ____________ Start time: ____________ Finish time: ____________ Date: ____________ Movements: ____________ Start time: ____________ Finish time: ____________ Date: ____________ Movements: ____________ Start time: ____________ Finish time: ____________ Date: ____________ Movements: ____________ Start time: ____________ Finish time: ____________ Date: ____________ Movements: ____________ Start time: ____________ Finish time: ____________ Date: ____________ Movements: ____________ Start time: ____________ Finish time: ____________   This information is not intended to replace advice given to you by your health care provider. Make sure you discuss any questions you have with your health care provider.   Document Released: 04/04/2006 Document Revised: 03/26/2014 Document Reviewed: 12/31/2011 Elsevier Interactive Patient Education 2016 [employer] Braxton Hicks Contractions Contractions of the uterus can occur throughout pregnancy. Contractions are not always a sign that you are in labor.  WHAT ARE BRAXTON HICKS CONTRACTIONS?  Contractions that occur before labor are called Braxton Hicks contractions, or false labor. Toward the end of pregnancy (32-34 weeks), these contractions can develop more often and may become more forceful. This is not true labor because these contractions do not result in opening (dilatation) and thinning of  the cervix. They are sometimes difficult to tell apart from true labor because these contractions can be forceful and people have different pain tolerances. You should not feel embarrassed if you go to the hospital with false labor. Sometimes, the only way to tell if you are in true labor is for your health care provider to look for changes in the cervix. If there are no prenatal problems or other health problems associated with the pregnancy, it is completely safe to be sent home with false labor and await the onset of true labor. HOW CAN YOU TELL THE DIFFERENCE BETWEEN TRUE AND FALSE LABOR? False Labor  The contractions of false labor are usually shorter and not as hard as those of true labor.   The contractions are usually irregular.   The contractions are often felt in the front of the lower abdomen and in the groin.   The contractions may go away when you walk around or change positions while lying down.   The contractions get weaker and are shorter lasting as time goes on.   The contractions do not usually become progressively stronger, regular, and closer together as with true labor.  True Labor 5. Contractions in true   labor last 30-70 seconds, become very regular, usually become more intense, and increase in frequency.  6. The contractions do not go away with walking.  7. The discomfort is usually felt in the top of the uterus and spreads to the lower abdomen and low back.  8. True labor can be determined by your health care provider with an exam. This will show that the cervix is dilating and getting thinner.  WHAT TO REMEMBER  Keep up with your usual exercises and follow other instructions given by your health care provider.   Take medicines as directed by your health care provider.   Keep your regular prenatal appointments.   Eat and drink lightly if you think you are going into labor.   If Braxton Hicks contractions are making you uncomfortable:   Change  your position from lying down or resting to walking, or from walking to resting.   Sit and rest in a tub of warm water.   Drink 2-3 glasses of water. Dehydration may cause these contractions.   Do slow and deep breathing several times an hour.  WHEN SHOULD I SEEK IMMEDIATE MEDICAL CARE? Seek immediate medical care if:  Your contractions become stronger, more regular, and closer together.   You have fluid leaking or gushing from your vagina.   You have a fever.   You pass blood-tinged mucus.   You have vaginal bleeding.   You have continuous abdominal pain.   You have low back pain that you never had before.   You feel your baby's head pushing down and causing pelvic pressure.   Your baby is not moving as much as it used to.    This information is not intended to replace advice given to you by your health care provider. Make sure you discuss any questions you have with your health care provider.   Document Released: 03/05/2005 Document Revised: 03/10/2013 Document Reviewed: 12/15/2012 Elsevier Interactive Patient Education 2016 [employer]  

## 2015-01-26 NOTE — Progress Notes (Signed)
Pain- lower back  36wk labs today

## 2015-01-27 LAB — GC/CHLAMYDIA PROBE AMP (~~LOC~~) NOT AT ARMC
Chlamydia: NEGATIVE
Neisseria Gonorrhea: NEGATIVE

## 2015-01-28 LAB — CULTURE, BETA STREP (GROUP B ONLY)

## 2015-02-02 ENCOUNTER — Encounter: Payer: Self-pay | Admitting: Physician Assistant

## 2015-02-03 ENCOUNTER — Encounter: Payer: Self-pay | Admitting: Physician Assistant

## 2015-02-03 ENCOUNTER — Ambulatory Visit (INDEPENDENT_AMBULATORY_CARE_PROVIDER_SITE_OTHER): Payer: Medicaid Other | Admitting: Physician Assistant

## 2015-02-03 VITALS — BP 115/70 | HR 91 | Wt 158.5 lb

## 2015-02-03 DIAGNOSIS — O0993 Supervision of high risk pregnancy, unspecified, third trimester: Secondary | ICD-10-CM

## 2015-02-03 LAB — POCT URINALYSIS DIP (DEVICE)
BILIRUBIN URINE: NEGATIVE
Glucose, UA: NEGATIVE mg/dL
HGB URINE DIPSTICK: NEGATIVE
KETONES UR: NEGATIVE mg/dL
Leukocytes, UA: NEGATIVE
Nitrite: NEGATIVE
PH: 7 (ref 5.0–8.0)
Protein, ur: 30 mg/dL — AB
Specific Gravity, Urine: 1.025 (ref 1.005–1.030)
Urobilinogen, UA: 1 mg/dL (ref 0.0–1.0)

## 2015-02-03 NOTE — Progress Notes (Signed)
Subjective:  Rhonda Fischer is a 24 y.o. G2P1001 at 3044w3d being seen today for ongoing prenatal care.  Patient reports backache.  Contractions: Not present.  Vag. Bleeding: None. Movement: Present. Denies leaking of fluid.   The following portions of the patient's history were reviewed and updated as appropriate: allergies, current medications, past family history, past medical history, past social history, past surgical history and problem list.   Objective:   Filed Vitals:   02/03/15 1505  BP: 115/70  Pulse: 91  Weight: 158 lb 8 oz (71.895 kg)    Fetal Status: Fetal Heart Rate (bpm): 146   Movement: Present     General:  Alert, oriented and cooperative. Patient is in no acute distress.  Skin: Skin is warm and dry. No rash noted.   Cardiovascular: Normal heart rate noted  Respiratory: Normal respiratory effort, no problems with respiration noted  Abdomen: Soft, gravid, appropriate for gestational age. Pain/Pressure: Present     Pelvic: Vag. Bleeding: None     Cervical exam deferred        Extremities: Normal range of motion.  Edema: None  Mental Status: Normal mood and affect. Normal behavior. Normal judgment and thought content.   Urinalysis:      Assessment and Plan:  Pregnancy: G2P1001 at 3444w3d  Supervision normal pregnancy third trimester Maternity support belt encouraged.     Term labor symptoms and general obstetric precautions including but not limited to vaginal bleeding, contractions, leaking of fluid and fetal movement were reviewed in detail with the patient. Please refer to After Visit Summary for other counseling recommendations.  Return in about 1 week (around 02/10/2015).   Bertram DenverKaren E Teague Clark, PA-C

## 2015-02-03 NOTE — Patient Instructions (Signed)
Pain Relief During Labor and Delivery Everyone experiences pain differently, but labor causes severe pain for many women. The amount of pain you experience during labor and delivery depends on your pain tolerance, contraction strength, and your baby's size and position. There are many ways to prepare for and deal with the pain, including:   Taking prenatal classes to learn about labor and delivery. The more informed you are, the less anxious and afraid you may be. This can help lessen the pain.  Taking pain-relieving medicine during labor and delivery.  Learning breathing and relaxation techniques.  Taking a shower or bath.  Getting massaged.  Changing positions.  Placing an ice pack on your back. Discuss your pain control options with your health care provider during your prenatal visits.  WHAT ARE THE TWO TYPES OF PAIN-RELIEVING MEDICINES? 1. Analgesics. These are medicines that decrease pain without total loss of feeling or muscle movement. 2. Anesthetics. These are medicines that block all feeling, including pain. There can be minor side effects of both types, such as nausea, trouble concentrating, becoming sleepy, and lowering the heart rate of the baby. However, health care providers are careful to give doses that will not seriously affect the baby.  WHAT ARE THE SPECIFIC TYPES OF ANALGESICS AND ANESTHETICS? Systemic Analgesic Systemic pain medicines affect your whole body rather than focusing pain relief on the area of your body experiencing pain. This type of medicine is given either through an IV tube in your vein or by a shot (injection) into your muscle. This medicine will lessen your pain but will not stop it completely. It may also make you sleepy, but it will not make you lose consciousness.  Local Anesthetic Local anesthetic isused tonumb a small area of your body. The medicine is injected into the area of nerves that carry feeling to the vagina, vulva, or the area between  the vagina and anus (perineum).  General Anesthetic This type of medicine causes you to lose consciousness so you do not feel pain. It is usually used only in emergency situations during labor. It is given through an IV tube or face mask. Paracervical Block A paracervical block is a form of local anesthesia given during labor. Numbing medicine is injected into the right and left sides of the cervix and vagina. It helps to lessen the pain caused by contractions and stretching of the cervix. It may have to be given more than once.  Pudendal Block A pudendal block is another form of local anesthesia. It is used to relieve the pain associated with pushing or stretching of the perineum at the time of delivery. An injection is given deep through the vaginal wall into the pudendal nerve in the pelvis, numbing the perineum.  Epidural Anesthetic An epidural is an injection of numbing medicine given in the lower back and into the epidural space near your spinal cord. The epidural numbs the lower half of your body. You may be able to move your legs but will not be allowed to walk. Epidurals can be used for labor, delivery, or cesarean deliveries.  To prevent the medicine from wearing off, a small tube (catheter) may be threaded into the epidural space and taped in place to prevent it from slipping out. Medicine can then be given continuously in small doses through the tube until you deliver. Spinal Block A spinal block is similar to an epidural, but the medicine is injected into the spinal fluid, not the epidural space. A spinal block is only given   once. It starts to relieve pain quickly but lasts only 1-2 hours. Spinal blocks can also be used for cesarean deliveries.  Combined Spinal-Epidural Block Combined spinal-epidural blocks combine the benefits of both the spinal and epidural blocks. The spinal part acts quickly to relieve pain and the epidural provides continuous pain relief. Hydrotherapy Immersion in  warm water during labor may provide comfort and relaxation. It may also help to lessen pain, the use of anesthesia, and the length of labor. However, immersion in water during the delivery (water birth) may have some risk involved and studies to determine safety and risks are ongoing. If you are a healthy woman who is expecting an uncomplicated birth, talk with your health care provider to see if water birth is an option for you.    This information is not intended to replace advice given to you by your health care provider. Make sure you discuss any questions you have with your health care provider.   Document Released: 06/21/2008 Document Revised: 03/10/2013 Document Reviewed: 07/24/2012 Elsevier Interactive Patient Education 2016 [employer] Inc[employer]  

## 2015-02-09 ENCOUNTER — Encounter: Payer: Self-pay | Admitting: Obstetrics and Gynecology

## 2015-02-09 ENCOUNTER — Ambulatory Visit (INDEPENDENT_AMBULATORY_CARE_PROVIDER_SITE_OTHER): Payer: Medicaid Other | Admitting: Obstetrics and Gynecology

## 2015-02-09 VITALS — BP 109/69 | HR 87 | Temp 98.1°F | Wt 160.3 lb

## 2015-02-09 DIAGNOSIS — Z789 Other specified health status: Secondary | ICD-10-CM

## 2015-02-09 DIAGNOSIS — Z3491 Encounter for supervision of normal pregnancy, unspecified, first trimester: Secondary | ICD-10-CM

## 2015-02-09 DIAGNOSIS — O26873 Cervical shortening, third trimester: Secondary | ICD-10-CM | POA: Diagnosis not present

## 2015-02-09 DIAGNOSIS — O0993 Supervision of high risk pregnancy, unspecified, third trimester: Secondary | ICD-10-CM

## 2015-02-09 LAB — POCT URINALYSIS DIP (DEVICE)
BILIRUBIN URINE: NEGATIVE
GLUCOSE, UA: NEGATIVE mg/dL
KETONES UR: NEGATIVE mg/dL
Nitrite: NEGATIVE
Protein, ur: NEGATIVE mg/dL
SPECIFIC GRAVITY, URINE: 1.02 (ref 1.005–1.030)
UROBILINOGEN UA: 0.2 mg/dL (ref 0.0–1.0)
pH: 6.5 (ref 5.0–8.0)

## 2015-02-09 MED ORDER — PRENATAL 27-1 MG PO TABS: 1.0000 | ORAL_TABLET | Freq: Every day | ORAL | Status: DC

## 2015-02-09 NOTE — Progress Notes (Signed)
Subjective:  Rhonda Fischer is a 24 y.o. G2P1001 at 7723w2d being seen today for ongoing prenatal care.  She is currently monitored for the following issues for this high-risk pregnancy and has Language barrier; Supervision of high risk pregnancy, antepartum; and Short cervical length during pregnancy in third trimester on her problem list.  Patient reports no complaints.  Contractions: Irritability. Vag. Bleeding: None.  Movement: Present. Denies leaking of fluid.   The following portions of the patient's history were reviewed and updated as appropriate: allergies, current medications, past family history, past medical history, past social history, past surgical history and problem list. Problem list updated.  Objective:   Filed Vitals:   02/09/15 1450  BP: 109/69  Pulse: 87  Temp: 98.1 F (36.7 C)  Weight: 160 lb 4.8 oz (72.712 kg)    Fetal Status: Fetal Heart Rate (bpm): 135   Movement: Present     General:  Alert, oriented and cooperative. Patient is in no acute distress.  Skin: Skin is warm and dry. No rash noted.   Cardiovascular: Normal heart rate noted  Respiratory: Normal respiratory effort, no problems with respiration noted  Abdomen: Soft, gravid, appropriate for gestational age. Pain/Pressure: Present     Pelvic: Vag. Bleeding: None     Cervical exam deferred        Extremities: Normal range of motion.  Edema: None  Mental Status: Normal mood and affect. Normal behavior. Normal judgment and thought content.   Urinalysis: Urine Protein: Negative Urine Glucose: Negative  Assessment and Plan:  Pregnancy: G2P1001 at 2123w2d  1. Short cervical length during pregnancy in third trimester   2. Language barrier Patient speaks french  3. Supervision of high risk pregnancy, antepartum, third trimester Patient is doing well  Term labor symptoms and general obstetric precautions including but not limited to vaginal bleeding, contractions, leaking of fluid and fetal movement  were reviewed in detail with the patient. Please refer to After Visit Summary for other counseling recommendations.  Return in about 1 week (around 02/16/2015).   Catalina AntiguaPeggy Verna Hamon, MD

## 2015-02-09 NOTE — Addendum Note (Signed)
Addended by: Catalina AntiguaONSTANT, Reeanna Acri on: 02/09/2015 03:05 PM   Modules accepted: Orders

## 2015-02-10 ENCOUNTER — Encounter (HOSPITAL_COMMUNITY): Payer: Self-pay | Admitting: *Deleted

## 2015-02-10 ENCOUNTER — Inpatient Hospital Stay (HOSPITAL_COMMUNITY)
Admission: AD | Admit: 2015-02-10 | Discharge: 2015-02-10 | Disposition: A | Discharge status: Home / Self Care | Payer: Medicaid Other | Source: Ambulatory Visit | Attending: Obstetrics and Gynecology | Admitting: Obstetrics and Gynecology

## 2015-02-10 ENCOUNTER — Inpatient Hospital Stay (EMERGENCY_DEPARTMENT_HOSPITAL)
Admission: AD | Admit: 2015-02-10 | Discharge: 2015-02-11 | Disposition: A | Discharge status: Home / Self Care | Payer: Medicaid Other | Source: Ambulatory Visit | Attending: Obstetrics & Gynecology | Admitting: Obstetrics & Gynecology

## 2015-02-10 DIAGNOSIS — Z3493 Encounter for supervision of normal pregnancy, unspecified, third trimester: Secondary | ICD-10-CM | POA: Insufficient documentation

## 2015-02-10 DIAGNOSIS — O4693 Antepartum hemorrhage, unspecified, third trimester: Secondary | ICD-10-CM

## 2015-02-10 MED ORDER — OXYCODONE-ACETAMINOPHEN 5-325 MG PO TABS
1.0000 | ORAL_TABLET | Freq: Once | ORAL | Status: AC
  Administered 2015-02-10: 1 via ORAL
  Filled 2015-02-10: qty 1

## 2015-02-10 NOTE — MAU Note (Signed)
Pt c/o vaginal bleeding that started about an hour ago. Is not wearing a pad. Having some lower abdominal pain-started with bleeding. ?cramping/contractions. Last felt movement this morning. Cervix 3cm this morning.

## 2015-02-10 NOTE — Discharge Instructions (Signed)
Fetal Movement Counts Patient Name: __________________________________________________ Patient Due Date: ____________________ Performing a fetal movement count is highly recommended in high-risk pregnancies, but it is good for every pregnant woman to do. Your health care provider may ask you to start counting fetal movements at 28 weeks of the pregnancy. Fetal movements often increase:  After eating a full meal.  After physical activity.  After eating or drinking something sweet or cold.  At rest. Pay attention to when you feel the baby is most active. This will help you notice a pattern of your baby's sleep and wake cycles and what factors contribute to an increase in fetal movement. It is important to perform a fetal movement count at the same time each day when your baby is normally most active.  HOW TO COUNT FETAL MOVEMENTS 1. Find a quiet and comfortable area to sit or lie down on your left side. Lying on your left side provides the best blood and oxygen circulation to your baby. 2. Write down the day and time on a sheet of paper or in a journal. 3. Start counting kicks, flutters, swishes, rolls, or jabs in a 2-hour period. You should feel at least 10 movements within 2 hours. 4. If you do not feel 10 movements in 2 hours, wait 2-3 hours and count again. Look for a change in the pattern or not enough counts in 2 hours. SEEK MEDICAL CARE IF:  You feel less than 10 counts in 2 hours, tried twice.  There is no movement in over an hour.  The pattern is changing or taking longer each day to reach 10 counts in 2 hours.  You feel the baby is not moving as he or she usually does. Date: ____________ Movements: ____________ Start time: ____________ Finish time: ____________  Date: ____________ Movements: ____________ Start time: ____________ Finish time: ____________ Date: ____________ Movements: ____________ Start time: ____________ Finish time: ____________ Date: ____________ Movements:  ____________ Start time: ____________ Finish time: ____________ Date: ____________ Movements: ____________ Start time: ____________ Finish time: ____________ Date: ____________ Movements: ____________ Start time: ____________ Finish time: ____________ Date: ____________ Movements: ____________ Start time: ____________ Finish time: ____________ Date: ____________ Movements: ____________ Start time: ____________ Finish time: ____________  Date: ____________ Movements: ____________ Start time: ____________ Finish time: ____________ Date: ____________ Movements: ____________ Start time: ____________ Finish time: ____________ Date: ____________ Movements: ____________ Start time: ____________ Finish time: ____________ Date: ____________ Movements: ____________ Start time: ____________ Finish time: ____________ Date: ____________ Movements: ____________ Start time: ____________ Finish time: ____________ Date: ____________ Movements: ____________ Start time: ____________ Finish time: ____________ Date: ____________ Movements: ____________ Start time: ____________ Finish time: ____________  Date: ____________ Movements: ____________ Start time: ____________ Finish time: ____________ Date: ____________ Movements: ____________ Start time: ____________ Finish time: ____________ Date: ____________ Movements: ____________ Start time: ____________ Finish time: ____________ Date: ____________ Movements: ____________ Start time: ____________ Finish time: ____________ Date: ____________ Movements: ____________ Start time: ____________ Finish time: ____________ Date: ____________ Movements: ____________ Start time: ____________ Finish time: ____________ Date: ____________ Movements: ____________ Start time: ____________ Finish time: ____________  Date: ____________ Movements: ____________ Start time: ____________ Finish time: ____________ Date: ____________ Movements: ____________ Start time: ____________ Finish  time: ____________ Date: ____________ Movements: ____________ Start time: ____________ Finish time: ____________ Date: ____________ Movements: ____________ Start time: ____________ Finish time: ____________ Date: ____________ Movements: ____________ Start time: ____________ Finish time: ____________ Date: ____________ Movements: ____________ Start time: ____________ Finish time: ____________ Date: ____________ Movements: ____________ Start time: ____________ Finish time: ____________  Date: ____________ Movements: ____________ Start time: ____________ Finish   time: ____________ Date: ____________ Movements: ____________ Start time: ____________ Finish time: ____________ Date: ____________ Movements: ____________ Start time: ____________ Finish time: ____________ Date: ____________ Movements: ____________ Start time: ____________ Finish time: ____________ Date: ____________ Movements: ____________ Start time: ____________ Finish time: ____________ Date: ____________ Movements: ____________ Start time: ____________ Finish time: ____________ Date: ____________ Movements: ____________ Start time: ____________ Finish time: ____________  Date: ____________ Movements: ____________ Start time: ____________ Finish time: ____________ Date: ____________ Movements: ____________ Start time: ____________ Finish time: ____________ Date: ____________ Movements: ____________ Start time: ____________ Finish time: ____________ Date: ____________ Movements: ____________ Start time: ____________ Finish time: ____________ Date: ____________ Movements: ____________ Start time: ____________ Finish time: ____________ Date: ____________ Movements: ____________ Start time: ____________ Finish time: ____________ Date: ____________ Movements: ____________ Start time: ____________ Finish time: ____________  Date: ____________ Movements: ____________ Start time: ____________ Finish time: ____________ Date: ____________  Movements: ____________ Start time: ____________ Finish time: ____________ Date: ____________ Movements: ____________ Start time: ____________ Finish time: ____________ Date: ____________ Movements: ____________ Start time: ____________ Finish time: ____________ Date: ____________ Movements: ____________ Start time: ____________ Finish time: ____________ Date: ____________ Movements: ____________ Start time: ____________ Finish time: ____________ Date: ____________ Movements: ____________ Start time: ____________ Finish time: ____________  Date: ____________ Movements: ____________ Start time: ____________ Finish time: ____________ Date: ____________ Movements: ____________ Start time: ____________ Finish time: ____________ Date: ____________ Movements: ____________ Start time: ____________ Finish time: ____________ Date: ____________ Movements: ____________ Start time: ____________ Finish time: ____________ Date: ____________ Movements: ____________ Start time: ____________ Finish time: ____________ Date: ____________ Movements: ____________ Start time: ____________ Finish time: ____________   This information is not intended to replace advice given to you by your health care provider. Make sure you discuss any questions you have with your health care provider.   Document Released: 04/04/2006 Document Revised: 03/26/2014 Document Reviewed: 12/31/2011 Elsevier Interactive Patient Education 2016 [employer] Braxton Hicks Contractions Contractions of the uterus can occur throughout pregnancy. Contractions are not always a sign that you are in labor.  WHAT ARE BRAXTON HICKS CONTRACTIONS?  Contractions that occur before labor are called Braxton Hicks contractions, or false labor. Toward the end of pregnancy (32-34 weeks), these contractions can develop more often and may become more forceful. This is not true labor because these contractions do not result in opening (dilatation) and thinning of  the cervix. They are sometimes difficult to tell apart from true labor because these contractions can be forceful and people have different pain tolerances. You should not feel embarrassed if you go to the hospital with false labor. Sometimes, the only way to tell if you are in true labor is for your health care provider to look for changes in the cervix. If there are no prenatal problems or other health problems associated with the pregnancy, it is completely safe to be sent home with false labor and await the onset of true labor. HOW CAN YOU TELL THE DIFFERENCE BETWEEN TRUE AND FALSE LABOR? False Labor  The contractions of false labor are usually shorter and not as hard as those of true labor.   The contractions are usually irregular.   The contractions are often felt in the front of the lower abdomen and in the groin.   The contractions may go away when you walk around or change positions while lying down.   The contractions get weaker and are shorter lasting as time goes on.   The contractions do not usually become progressively stronger, regular, and closer together as with true labor.  True Labor 5. Contractions in true   labor last 30-70 seconds, become very regular, usually become more intense, and increase in frequency.  6. The contractions do not go away with walking.  7. The discomfort is usually felt in the top of the uterus and spreads to the lower abdomen and low back.  8. True labor can be determined by your health care provider with an exam. This will show that the cervix is dilating and getting thinner.  WHAT TO REMEMBER  Keep up with your usual exercises and follow other instructions given by your health care provider.   Take medicines as directed by your health care provider.   Keep your regular prenatal appointments.   Eat and drink lightly if you think you are going into labor.   If Braxton Hicks contractions are making you uncomfortable:   Change  your position from lying down or resting to walking, or from walking to resting.   Sit and rest in a tub of warm water.   Drink 2-3 glasses of water. Dehydration may cause these contractions.   Do slow and deep breathing several times an hour.  WHEN SHOULD I SEEK IMMEDIATE MEDICAL CARE? Seek immediate medical care if:  Your contractions become stronger, more regular, and closer together.   You have fluid leaking or gushing from your vagina.   You have a fever.   You pass blood-tinged mucus.   You have vaginal bleeding.   You have continuous abdominal pain.   You have low back pain that you never had before.   You feel your baby's head pushing down and causing pelvic pressure.   Your baby is not moving as much as it used to.    This information is not intended to replace advice given to you by your health care provider. Make sure you discuss any questions you have with your health care provider.   Document Released: 03/05/2005 Document Revised: 03/10/2013 Document Reviewed: 12/15/2012 Elsevier Interactive Patient Education 2016 [employer]  

## 2015-02-10 NOTE — MAU Note (Signed)
Patient presents to mau with c/o contractions since 230 am that are 3 minutes apart. Denies lof or vb at this time. +FM. States deferred SVE at last office visit.

## 2015-02-11 DIAGNOSIS — O4693 Antepartum hemorrhage, unspecified, third trimester: Secondary | ICD-10-CM

## 2015-02-11 NOTE — Discharge Instructions (Signed)
Braxton Hicks Contractions °Contractions of the uterus can occur throughout pregnancy. Contractions are not always a sign that you are in labor.  °WHAT ARE BRAXTON HICKS CONTRACTIONS?  °Contractions that occur before labor are called Braxton Hicks contractions, or false labor. Toward the end of pregnancy (32-34 weeks), these contractions can develop more often and may become more forceful. This is not true labor because these contractions do not result in opening (dilatation) and thinning of the cervix. They are sometimes difficult to tell apart from true labor because these contractions can be forceful and people have different pain tolerances. You should not feel embarrassed if you go to the hospital with false labor. Sometimes, the only way to tell if you are in true labor is for your health care provider to look for changes in the cervix. °If there are no prenatal problems or other health problems associated with the pregnancy, it is completely safe to be sent home with false labor and await the onset of true labor. °HOW CAN YOU TELL THE DIFFERENCE BETWEEN TRUE AND FALSE LABOR? °False Labor °· The contractions of false labor are usually shorter and not as hard as those of true labor.   °· The contractions are usually irregular.   °· The contractions are often felt in the front of the lower abdomen and in the groin.   °· The contractions may go away when you walk around or change positions while lying down.   °· The contractions get weaker and are shorter lasting as time goes on.   °· The contractions do not usually become progressively stronger, regular, and closer together as with true labor.   °True Labor °· Contractions in true labor last 30-70 seconds, become very regular, usually become more intense, and increase in frequency.   °· The contractions do not go away with walking.   °· The discomfort is usually felt in the top of the uterus and spreads to the lower abdomen and low back.   °· True labor can be  determined by your health care provider with an exam. This will show that the cervix is dilating and getting thinner.   °WHAT TO REMEMBER °· Keep up with your usual exercises and follow other instructions given by your health care provider.   °· Take medicines as directed by your health care provider.   °· Keep your regular prenatal appointments.   °· Eat and drink lightly if you think you are going into labor.   °· If Braxton Hicks contractions are making you uncomfortable:   °¨ Change your position from lying down or resting to walking, or from walking to resting.   °¨ Sit and rest in a tub of warm water.   °¨ Drink 2-3 glasses of water. Dehydration may cause these contractions.   °¨ Do slow and deep breathing several times an hour.   °WHEN SHOULD I SEEK IMMEDIATE MEDICAL CARE? °Seek immediate medical care if: °· Your contractions become stronger, more regular, and closer together.   °· You have fluid leaking or gushing from your vagina.   °· You have a fever.   °· You pass blood-tinged mucus.   °· You have vaginal bleeding.   °· You have continuous abdominal pain.   °· You have low back pain that you never had before.   °· You feel your baby's head pushing down and causing pelvic pressure.   °· Your baby is not moving as much as it used to.   °  °This information is not intended to replace advice given to you by your health care provider. Make sure you discuss any questions you have with your health care   provider. °  °Document Released: 03/05/2005 Document Revised: 03/10/2013 Document Reviewed: 12/15/2012 °Elsevier Interactive Patient Education ©2016 [employer] ° °

## 2015-02-11 NOTE — MAU Provider Note (Signed)
First Provider Initiated Contact with Patient 02/10/15 2345      Chief Complaint:  Vaginal Bleeding   Rhonda Fischer is  24 y.o. G2P1001 at 266w4d presents complaining of Vaginal Bleeding She was seen in MAU early this am with a labor check and was sent home.  Noticed some bleeding when wiping this evening, call-a-nurse told her to come for evaluation.  Not contracting much any more.    Obstetrical/Gynecological History: OB History    Gravida Para Term Preterm AB TAB SAB Ectopic Multiple Living   2 1 1  0 0 0 0 0 0 1     Past Medical History: Past Medical History  Diagnosis Date  . GERD (gastroesophageal reflux disease)     Past Surgical History: Past Surgical History  Procedure Laterality Date  . No past surgeries      Family History: History reviewed. No pertinent family history.  Social History: Social History  Substance Use Topics  . Smoking status: Never Smoker   . Smokeless tobacco: None  . Alcohol Use: No    Allergies: No Known Allergies  Meds:  No prescriptions prior to admission    Review of Systems   Constitutional: Negative for fever and chills Eyes: Negative for visual disturbances Respiratory: Negative for shortness of breath, dyspnea Cardiovascular: Negative for chest pain or palpitations  Gastrointestinal: Negative for vomiting, diarrhea and constipation Genitourinary: Negative for dysuria and urgency Musculoskeletal: Negative for back pain, joint pain, myalgias.  Normal ROM  Neurological: Negative for dizziness and headaches    Physical Exam  Blood pressure 109/69, pulse 97, temperature 98.4 F (36.9 C), temperature source Oral, resp. rate 16, height 5' 6.5" (1.689 m), weight 73.392 kg (161 lb 12.8 oz), last menstrual period 06/01/2014, SpO2 100 %, unknown if currently breastfeeding. GENERAL: Well-developed, well-nourished female in no acute distress.  LUNGS: Clear to auscultation bilaterally.  HEART: Regular rate and rhythm. ABDOMEN:  Soft, nontender, nondistended, gravid.  EXTREMITIES: Nontender, no edema, 2+ distal pulses. DTR's 2+ PELVIC:  SSE:  Scant amount of dark red blood coming from cx.  Cx non friable CERVICAL EXAM: Dilatation 3cm   Effacement 50%   Station -2 n(no change)   Presentation: cephalic FHT:  Baseline rate 145 bpm   Variability moderate  Accelerations present   Decelerations none Contractions: rare   Labs: No results found for this or any previous visit (from the past 24 hour(s)). Imaging Studies:  No results found.  Assessment: Rhonda Fischer is  24 y.o. G2P1001 at 256w4d presents with vaginal spotting s/p multiple vaginal exams.  Plan: DC home  CRESENZO-DISHMAN,Oma Marzan 11/25/20161:17 AM

## 2015-02-16 ENCOUNTER — Encounter: Payer: Self-pay | Admitting: Advanced Practice Midwife

## 2015-02-16 ENCOUNTER — Ambulatory Visit (INDEPENDENT_AMBULATORY_CARE_PROVIDER_SITE_OTHER): Payer: Medicaid Other | Admitting: Physician Assistant

## 2015-02-16 VITALS — BP 115/70 | HR 97 | Temp 98.2°F | Wt 160.3 lb

## 2015-02-16 DIAGNOSIS — O0993 Supervision of high risk pregnancy, unspecified, third trimester: Secondary | ICD-10-CM

## 2015-02-16 LAB — POCT URINALYSIS DIP (DEVICE)
Bilirubin Urine: NEGATIVE
Glucose, UA: NEGATIVE mg/dL
Ketones, ur: NEGATIVE mg/dL
Leukocytes, UA: NEGATIVE
NITRITE: NEGATIVE
PH: 5.5 (ref 5.0–8.0)
Protein, ur: 100 mg/dL — AB
UROBILINOGEN UA: 0.2 mg/dL (ref 0.0–1.0)

## 2015-02-16 NOTE — Progress Notes (Signed)
Subjective:  Rhonda Fischer is a 24 y.o. G2P1001 at 6435w2d being seen today for ongoing prenatal care.  Patient reports occasional contractions.  Contractions: Irregular.  Vag. Bleeding: None. Movement: Present. Denies leaking of fluid.   The following portions of the patient's history were reviewed and updated as appropriate: allergies, current medications, past family history, past medical history, past social history, past surgical history and problem list.   Objective:   Filed Vitals:   02/16/15 1304  BP: 115/70  Pulse: 97  Temp: 98.2 F (36.8 C)  Weight: 160 lb 4.8 oz (72.712 kg)    Fetal Status: Fetal Heart Rate (bpm): 124   Movement: Present     General:  Alert, oriented and cooperative. Patient is in no acute distress.  Skin: Skin is warm and dry. No rash noted.   Cardiovascular: Normal heart rate noted  Respiratory: Normal respiratory effort, no problems with respiration noted  Abdomen: Soft, gravid, appropriate for gestational age. Pain/Pressure: Present     Pelvic: Vag. Bleeding: None Vag D/C Character: Mucous   Cervical exam deferred        Extremities: Normal range of motion.  Edema: None  Mental Status: Normal mood and affect. Normal behavior. Normal judgment and thought content.   Urinalysis: Urine Protein: 2+ Urine Glucose: Negative  Assessment and Plan:  Pregnancy: G2P1001 at 5435w2d  1. Supervision of high risk pregnancy, antepartum, third trimester Cont routine prenatal care PNV qd  Term labor symptoms and general obstetric precautions including but not limited to vaginal bleeding, contractions, leaking of fluid and fetal movement were reviewed in detail with the patient. Please refer to After Visit Summary for other counseling recommendations.  Return in about 1 week (around 02/23/2015) for as scheduled. (NST, OB f/u)   Bertram DenverKaren E Teague Clark, PA-C

## 2015-02-16 NOTE — Progress Notes (Signed)
Breastfeeding tip of the week reviewed. 

## 2015-02-16 NOTE — Patient Instructions (Signed)

## 2015-02-18 ENCOUNTER — Encounter (HOSPITAL_COMMUNITY): Payer: Self-pay

## 2015-02-18 ENCOUNTER — Inpatient Hospital Stay (HOSPITAL_COMMUNITY)
Admission: AD | Admit: 2015-02-18 | Discharge: 2015-02-20 | DRG: 775 | Disposition: A | Discharge status: Home / Self Care | Payer: Medicaid Other | Source: Ambulatory Visit | Attending: Obstetrics and Gynecology | Admitting: Obstetrics and Gynecology

## 2015-02-18 DIAGNOSIS — IMO0001 Reserved for inherently not codable concepts without codable children: Secondary | ICD-10-CM

## 2015-02-18 DIAGNOSIS — K219 Gastro-esophageal reflux disease without esophagitis: Secondary | ICD-10-CM | POA: Diagnosis present

## 2015-02-18 DIAGNOSIS — Z3A39 39 weeks gestation of pregnancy: Secondary | ICD-10-CM | POA: Diagnosis not present

## 2015-02-18 DIAGNOSIS — O26873 Cervical shortening, third trimester: Principal | ICD-10-CM | POA: Diagnosis present

## 2015-02-18 DIAGNOSIS — O9962 Diseases of the digestive system complicating childbirth: Secondary | ICD-10-CM | POA: Diagnosis present

## 2015-02-18 LAB — CBC
HCT: 40.7 % (ref 36.0–46.0)
HEMOGLOBIN: 13.6 g/dL (ref 12.0–15.0)
MCH: 26.8 pg (ref 26.0–34.0)
MCHC: 33.4 g/dL (ref 30.0–36.0)
MCV: 80.3 fL (ref 78.0–100.0)
PLATELETS: 157 10*3/uL (ref 150–400)
RBC: 5.07 MIL/uL (ref 3.87–5.11)
RDW: 14.9 % (ref 11.5–15.5)
WBC: 14.3 10*3/uL — ABNORMAL HIGH (ref 4.0–10.5)

## 2015-02-18 LAB — TYPE AND SCREEN
ABO/RH(D): B POS
Antibody Screen: NEGATIVE

## 2015-02-18 MED ORDER — OXYCODONE-ACETAMINOPHEN 5-325 MG PO TABS: 2.0000 | ORAL_TABLET | ORAL | Status: DC | PRN

## 2015-02-18 MED ORDER — OXYCODONE-ACETAMINOPHEN 5-325 MG PO TABS
1.0000 | ORAL_TABLET | ORAL | Status: DC | PRN
  Administered 2015-02-19 – 2015-02-20 (×6): 1 via ORAL
  Filled 2015-02-18 (×6): qty 1

## 2015-02-18 MED ORDER — OXYTOCIN 40 UNITS IN LACTATED RINGERS INFUSION - SIMPLE MED
62.5000 mL/h | INTRAVENOUS | Status: DC
  Filled 2015-02-18: qty 1000

## 2015-02-18 MED ORDER — LACTATED RINGERS IV SOLN: 500.0000 mL | INTRAVENOUS | Status: DC | PRN

## 2015-02-18 MED ORDER — IBUPROFEN 600 MG PO TABS
600.0000 mg | ORAL_TABLET | Freq: Four times a day (QID) | ORAL | Status: DC
  Administered 2015-02-18 – 2015-02-20 (×6): 600 mg via ORAL
  Filled 2015-02-18 (×7): qty 1

## 2015-02-18 MED ORDER — FENTANYL CITRATE (PF) 100 MCG/2ML IJ SOLN
100.0000 ug | INTRAMUSCULAR | Status: DC | PRN
  Administered 2015-02-18: 100 ug via INTRAVENOUS
  Filled 2015-02-18 (×2): qty 2

## 2015-02-18 MED ORDER — LACTATED RINGERS IV BOLUS (SEPSIS)
1000.0000 mL | Freq: Once | INTRAVENOUS | Status: DC
  Administered 2015-02-18: 1000 mL via INTRAVENOUS

## 2015-02-18 MED ORDER — OXYCODONE-ACETAMINOPHEN 5-325 MG PO TABS: 1.0000 | ORAL_TABLET | ORAL | Status: DC | PRN

## 2015-02-18 MED ORDER — LACTATED RINGERS IV SOLN
INTRAVENOUS | Status: DC
  Administered 2015-02-18: 20:00:00 via INTRAVENOUS

## 2015-02-18 MED ORDER — FENTANYL CITRATE (PF) 100 MCG/2ML IJ SOLN: 100.0000 ug | INTRAMUSCULAR | Status: DC | PRN

## 2015-02-18 MED ORDER — ONDANSETRON HCL 4 MG/2ML IJ SOLN: 4.0000 mg | Freq: Four times a day (QID) | INTRAMUSCULAR | Status: DC | PRN

## 2015-02-18 MED ORDER — OXYTOCIN BOLUS FROM INFUSION
500.0000 mL | INTRAVENOUS | Status: DC
  Administered 2015-02-18: 500 mL via INTRAVENOUS

## 2015-02-18 MED ORDER — CITRIC ACID-SODIUM CITRATE 334-500 MG/5ML PO SOLN
30.0000 mL | ORAL | Status: DC | PRN
Start: 2015-02-18 — End: 2015-02-18

## 2015-02-18 MED ORDER — FENTANYL CITRATE (PF) 100 MCG/2ML IJ SOLN
100.0000 ug | Freq: Once | INTRAMUSCULAR | Status: AC
  Administered 2015-02-18: 100 ug via INTRAVENOUS
  Filled 2015-02-18: qty 2

## 2015-02-18 MED ORDER — ACETAMINOPHEN 325 MG PO TABS: 650.0000 mg | ORAL_TABLET | ORAL | Status: DC | PRN

## 2015-02-18 MED ORDER — LIDOCAINE HCL (PF) 1 % IJ SOLN
30.0000 mL | INTRAMUSCULAR | Status: DC | PRN
  Filled 2015-02-18: qty 30

## 2015-02-18 NOTE — MAU Note (Signed)
having pains, getting closer and stronger.  No bleeding or leaking. Has not been dilated.

## 2015-02-18 NOTE — MAU Note (Signed)
Called report to Sharon SellerHeather Koran RN BS Charge received room assignment of [id number].

## 2015-02-18 NOTE — H&P (Signed)
Rhonda Fischer is a 24 y.o. female G2P1001  pt of [facility] for shortened cervix at 29 weeks presenting for onset of regular painful contractions this afternoon.  She reports good fetal movement, denies LOF, vaginal bleeding, vaginal itching/burning, urinary symptoms, h/a, dizziness, n/v, or fever/chills.    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   Husband is in IT  Clinic  [facility] Prenatal Labs  Dating  6 wk [place] Blood type: B/POS/-- (05/20 1038)   Genetic Screen 1 Screen:    AFP:     Quad: Declined    NIPS: Antibody:NEG (05/20 1038)  Anatomic [place]  Incomplete anatomy:  Repeat  Rubella: >33.00 (05/20 1038)  GTT    Third trimester: 11/30/14 = 93 RPR: NON REAC (05/20 1038)   Flu vaccine  12/16/14 HBsAg: NEGATIVE (05/20 1038)   TDaP vaccine  11/30/14                Rhogam:  n/a HIV: NONREACTIVE (05/20 1038)   Baby Food     Breast                               GBS: (For PCN allergy, check sensitivities)  Contraception     Condoms Pap: normal (07/2014)  Circumcision     Yes -     Pediatrician    [facility]   Support Person    Husband Tawni Pummel     Maternal Medical History:  Reason for admission: Contractions.  Nausea.  Contractions: Onset was 3-5 hours ago.   Frequency: regular.   Duration is approximately 1 minute.   Perceived severity is strong.    Fetal activity: Perceived fetal activity is normal.   Last perceived fetal movement was within the past hour.    Prenatal complications: Shortened cervix at 29 weeks  Prenatal Complications - Diabetes: none.    OB History    Gravida Para Term Preterm AB TAB SAB Ectopic Multiple Living   0 0 0 0 0 0 1     Past Medical History  Diagnosis Date  . GERD (gastroesophageal reflux disease)    Past Surgical History  Procedure Laterality Date  . No past surgeries     Family History: family history is not on file. Social History:  reports that she has never smoked. She does not have any smokeless  tobacco history on file. She reports that she does not drink alcohol or use illicit drugs.   Prenatal Transfer Tool  Maternal Diabetes: No Genetic Screening: Declined Maternal Ultrasounds/Referrals: Normal Fetal Ultrasounds or other Referrals:  None Maternal Substance Abuse:  No Significant Maternal Medications:  None Significant Maternal Lab Results:  Lab values include: Group B Strep negative Other Comments:  None  Review of Systems  Constitutional: Negative for fever, chills and malaise/fatigue.  Eyes: Negative for blurred vision.  Respiratory: Negative for cough and shortness of breath.   Cardiovascular: Negative for chest pain.  Gastrointestinal: Positive for abdominal pain. Negative for heartburn, nausea, vomiting, diarrhea and constipation.  Genitourinary: Negative for dysuria, urgency and frequency.  Musculoskeletal: Negative.   Neurological: Negative for dizziness and headaches.  Psychiatric/Behavioral: Negative for depression.  All other systems reviewed and are negative.   Dilation: 4.5 Effacement (%): 90 Station: -2 Exam by:: Sharen Counter CNM Blood pressure 126/75, pulse 99, temperature 98.6 F (37 C), temperature source Oral, resp. rate 20, last menstrual period 06/01/2014, unknown if  currently breastfeeding. Maternal Exam:  Uterine Assessment: Contraction strength is moderate.  Contraction duration is 80 seconds. Contraction frequency is regular.   Abdomen: Fetal presentation: vertex  Cervix: Cervix evaluated by digital exam.     Fetal Exam Fetal Monitor Review: Mode: ultrasound.   Baseline rate: 135.  Variability: moderate (6-25 bpm).   Pattern: accelerations present and no decelerations.    Fetal State Assessment: Category I - tracings are normal.     Physical Exam  Nursing note and vitals reviewed. Constitutional: She is oriented to person, place, and time. She appears well-developed and well-nourished.  Neck: Normal range of motion.   Cardiovascular: Normal rate.   Respiratory: Effort normal.  GI: Soft.  Musculoskeletal: Normal range of motion.  Neurological: She is alert and oriented to person, place, and time.  Skin: Skin is warm and dry.  Psychiatric: She has a normal mood and affect. Her behavior is normal. Judgment and thought content normal.    Prenatal labs: ABO, Rh: B/POS/-- (05/20 1038) Antibody: NEG (05/20 1038) Rubella: >33.00 (05/20 1038) RPR: NON REAC (09/13 1444)  HBsAg: NEGATIVE (05/20 1038)  HIV: NONREACTIVE (09/13 1444)  GBS: Negative (11/09 0000)   Assessment/Plan: 24 y.o. G2P1001 @ 6065w4d Active labor at term GBS negative  [facility] May have epidural when desired Anticipate NSVD  LEFTWICH-KIRBY, Lekeisha Arenas 02/18/2015, 6:46 PM

## 2015-02-18 NOTE — MAU Note (Signed)
Offered patient IV and IV pain medication, refused, notified Sharen CounterLisa Leftwich Kirby CNM

## 2015-02-19 LAB — ABO/RH: ABO/RH(D): B POS

## 2015-02-19 LAB — RPR: RPR Ser Ql: NONREACTIVE

## 2015-02-19 MED ORDER — SIMETHICONE 80 MG PO CHEW
80.0000 mg | CHEWABLE_TABLET | ORAL | Status: DC | PRN
Start: 2015-02-19 — End: 2015-02-20

## 2015-02-19 MED ORDER — DIPHENHYDRAMINE HCL 25 MG PO CAPS: 25.0000 mg | ORAL_CAPSULE | Freq: Four times a day (QID) | ORAL | Status: DC | PRN

## 2015-02-19 MED ORDER — WITCH HAZEL-GLYCERIN EX PADS: 1.0000 "application " | MEDICATED_PAD | CUTANEOUS | Status: DC | PRN

## 2015-02-19 MED ORDER — BENZOCAINE-MENTHOL 20-0.5 % EX AERO: 1.0000 "application " | INHALATION_SPRAY | CUTANEOUS | Status: DC | PRN

## 2015-02-19 MED ORDER — ZOLPIDEM TARTRATE 5 MG PO TABS: 5.0000 mg | ORAL_TABLET | Freq: Every evening | ORAL | Status: DC | PRN

## 2015-02-19 MED ORDER — ACETAMINOPHEN 325 MG PO TABS: 650.0000 mg | ORAL_TABLET | ORAL | Status: DC | PRN

## 2015-02-19 MED ORDER — LANOLIN HYDROUS EX OINT: TOPICAL_OINTMENT | CUTANEOUS | Status: DC | PRN

## 2015-02-19 MED ORDER — TETANUS-DIPHTH-ACELL PERTUSSIS 5-2.5-18.5 LF-MCG/0.5 IM SUSP: 0.5000 mL | Freq: Once | INTRAMUSCULAR | Status: DC

## 2015-02-19 MED ORDER — PRENATAL MULTIVITAMIN CH
1.0000 | ORAL_TABLET | Freq: Every day | ORAL | Status: DC
  Administered 2015-02-20: 1 via ORAL
  Filled 2015-02-19 (×2): qty 1

## 2015-02-19 MED ORDER — DIBUCAINE 1 % RE OINT: 1.0000 "application " | TOPICAL_OINTMENT | RECTAL | Status: DC | PRN

## 2015-02-19 MED ORDER — ONDANSETRON HCL 4 MG PO TABS: 4.0000 mg | ORAL_TABLET | ORAL | Status: DC | PRN

## 2015-02-19 MED ORDER — ONDANSETRON HCL 4 MG/2ML IJ SOLN
4.0000 mg | INTRAMUSCULAR | Status: DC | PRN
Start: 2015-02-19 — End: 2015-02-20

## 2015-02-19 MED ORDER — SENNOSIDES-DOCUSATE SODIUM 8.6-50 MG PO TABS
2.0000 | ORAL_TABLET | ORAL | Status: DC
  Administered 2015-02-19: 2 via ORAL
  Filled 2015-02-19: qty 2

## 2015-02-19 NOTE — Lactation Note (Signed)
This note was copied from the chart of Boy Iracema Pounds. Lactation Consultation Note  Initial visit done.  Breastfeeding consultation services and support information given to patient.  Mom states baby is latching easily and nursing well.  Reviewed breastfeeding basics.  She denies questions or concerns at present.  Encouraged to call with concerns or assist.  Patient Name: Boy Jeffie PollockDassaeve Valenza YNWGN'FToday's Date: 02/19/2015 Reason for consult: Initial assessment   Maternal Data Formula Feeding for Exclusion: Yes Reason for exclusion: Mother's choice to formula feed on admision Does the patient have breastfeeding experience prior to this delivery?: Yes  Feeding    LATCH Score/Interventions                      Lactation Tools Discussed/Used     Consult Status Consult Status: Follow-up Date: 02/20/15 Follow-up type: In-patient    Huston FoleyMOULDEN, Iris Tatsch S 02/19/2015, 11:15 AM

## 2015-02-19 NOTE — Progress Notes (Signed)
Post Partum Day 1  Please see below student note for official documentation.   Subjective:  Rhonda Fischer is a 24 y.o. [id number] 1311w4d s/p SVD.  No acute events overnight.  Pt denies problems with ambulating, voiding or po intake.  She denies nausea or vomiting.  Pain is well controlled.  She has had flatus.  Lochia Small.  Plan for birth control is condoms.  Method of Feeding: breast.  Objective: Blood pressure 100/59, pulse 81, temperature 98.4 F (36.9 C), temperature source Oral, resp. rate 20, height 5' 6.5" (1.689 m), weight 72.576 kg (160 lb), last menstrual period 06/01/2014, unknown if currently breastfeeding.  Physical Exam:  General: alert, cooperative and no distress Lochia:normal flow Chest: normal WOB Heart: Regular rate Abdomen: +BS, soft, mild TTP (appropriate) Uterine Fundus: firm, above the umbilicus DVT Evaluation: No evidence of DVT seen on physical exam. Extremities: no edema   Recent Labs  02/18/15 1900  HGB 13.6  HCT 40.7    Assessment/Plan:  ASSESSMENT: Rhonda Fischer is a 24 y.o. [id number] 4611w4d s/p SVD doing well. Plans for outpatient circumcision.   Plan for discharge tomorrow Continue routine PP care Breastfeeding support PRN  LOS: 1 day   [name] 02/19/2015, 8:04 AM   OB fellow attestation Post Partum Day 1 I have seen and examined this patient and agree with above documentation in the medical student's note.   Rhonda Fischer is a 24 y.o. [id number] s/p NSVD.  Pt denies problems with ambulating, voiding or po intake. Pain is well controlled.  Plan for birth control is condoms.  Method of Feeding: bottle  PE:  BP 100/59 mmHg  Pulse 81  Temp(Src) 98.4 F (36.9 C) (Oral)  Resp 20  Ht 5' 6.5" (1.689 m)  Wt 160 lb (72.576 kg)  BMI 25.44 kg/m2  LMP 06/01/2014  Breastfeeding? Unknown Gen: well appearing Heart: reg rate Lungs: normal WOB Fundus firm Ext: soft, no pain, no edema  Plan for discharge: PPD#2 -routine postpartum  care -lactation support prn  Federico FlakeKimberly Niles Eames Dibiasio, MD 11:00 AM

## 2015-02-20 MED ORDER — IBUPROFEN 600 MG PO TABS: 600.0000 mg | ORAL_TABLET | Freq: Four times a day (QID) | ORAL | Status: DC

## 2015-02-20 MED ORDER — ACETAMINOPHEN 325 MG PO TABS: 650.0000 mg | ORAL_TABLET | ORAL | Status: DC | PRN

## 2015-02-20 NOTE — Discharge Summary (Signed)
OB Discharge Summary     Patient Name: Rhonda Fischer DOB: 11-28-1990 MRN: [id number]  Date of admission: 02/18/2015 Delivering MD: Cam HaiSHAW, KIMBERLY D   Date of discharge: 02/20/2015  Admitting diagnosis: 39w ctx 3 min, pressure in lwr abd and back Intrauterine pregnancy: 4575w4d     Secondary diagnosis:  Active Problems:   Active labor at term  Additional problems: none     Discharge diagnosis: Term Pregnancy Delivered                                                                                                Post partum procedures:none  Augmentation: none  Complications: None  Hospital course:  Onset of Labor With Vaginal Delivery     24 y.o. yo [id number] at 7875w4d was admitted in Active Laboron 02/18/2015. Patient had an uncomplicated labor course as follows:  Membrane Rupture Time/Date: 9:21 PM ,02/18/2015   Intrapartum Procedures: Episiotomy: None [1]                                         Lacerations:  None [1]  Patient had a delivery of a Viable infant. 02/18/2015  Information for the patient's newborn:  Thornton ParkSimon, Boy Rhonda Fischer [[id number]][[id number]]  Delivery Method: Vaginal, Spontaneous Delivery (Filed from Delivery Summary)    Pateint had an uncomplicated postpartum course.  She is ambulating, tolerating a regular diet, passing flatus, and urinating well. Patient is discharged home in stable condition on No discharge date for patient encounter.Marland Kitchen.    Physical exam  Filed Vitals:   02/19/15 0105 02/19/15 0530 02/19/15 1124 02/19/15 1900  BP: 111/67 100/59 93/55 95/65   Pulse: 98 81 84 79  Temp: 98.8 F (37.1 C) 98.4 F (36.9 C) 98.6 F (37 C) 98.8 F (37.1 C)  TempSrc: Oral Oral Oral Oral  Resp: 16 20 18 20   Height:      Weight:      SpO2:    100%   General: alert, cooperative and no distress Lochia: appropriate Uterine Fundus: firm Incision: N/A DVT Evaluation: No cords or calf tenderness. No significant calf/ankle edema. Labs: Lab Results  Component Value Date   WBC 14.3* 02/18/2015   HGB 13.6 02/18/2015   HCT 40.7 02/18/2015   MCV 80.3 02/18/2015   PLT 157 02/18/2015   CMP Latest Ref Rng 07/09/2012  Glucose 70 - 99 mg/dL 76  BUN 6 - 23 mg/dL 5(L)  Creatinine 6.570.50 - 1.10 mg/dL 8.460.50  Sodium 962135 - 952145 mEq/L 137  Potassium 3.5 - 5.1 mEq/L 3.7  Chloride 96 - 112 mEq/L 105  CO2 19 - 32 mEq/L 23  Calcium 8.4 - 10.5 mg/dL 9.1    Discharge instruction: per After Visit Summary and "Baby and Me Booklet".  After visit meds:    Medication List    STOP taking these medications        Doxylamine-Pyridoxine 10-10 MG Tbec  Commonly known as:  DICLEGIS     promethazine 25 MG tablet  Commonly known as:  PHENERGAN  TAKE these medications        acetaminophen 325 MG tablet  Commonly known as:  TYLENOL  Take 2 tablets (650 mg total) by mouth every 4 (four) hours as needed (for pain scale < 4).     ibuprofen 600 MG tablet  Commonly known as:  ADVIL,MOTRIN  Take 1 tablet (600 mg total) by mouth every 6 (six) hours.     PRENATAL 27-1 MG Tabs  Take 1 tablet by mouth daily.        Diet: routine diet  Activity: Advance as tolerated. Pelvic rest for 6 weeks.   Outpatient follow up:6 weeks Follow up Appt:Future Appointments Date Time Provider Department Center  02/23/2015 8:30 AM [facility]   Follow up Visit:No Follow-up on file.  Postpartum contraception: Condoms  Newborn Data: Live born female  Birth Weight: 7 lb 6 oz (3345 g) APGAR: 8, 9  Baby Feeding: Breast Disposition:home with mother   02/20/2015 Silvano Bilis, MD

## 2015-02-20 NOTE — Discharge Instructions (Signed)

## 2015-02-23 ENCOUNTER — Other Ambulatory Visit: Payer: Self-pay

## 2015-02-23 NOTE — Progress Notes (Signed)
Post discharge chart review completed.  

## 2015-03-30 ENCOUNTER — Encounter: Payer: Self-pay | Admitting: Advanced Practice Midwife

## 2015-03-30 ENCOUNTER — Ambulatory Visit (INDEPENDENT_AMBULATORY_CARE_PROVIDER_SITE_OTHER): Payer: Self-pay | Admitting: Advanced Practice Midwife

## 2015-03-30 DIAGNOSIS — Z3009 Encounter for other general counseling and advice on contraception: Secondary | ICD-10-CM

## 2015-03-30 LAB — POCT URINALYSIS DIP (DEVICE)
Bilirubin Urine: NEGATIVE
GLUCOSE, UA: NEGATIVE mg/dL
KETONES UR: NEGATIVE mg/dL
Nitrite: NEGATIVE
PH: 5 (ref 5.0–8.0)
PROTEIN: NEGATIVE mg/dL
SPECIFIC GRAVITY, URINE: 1.025 (ref 1.005–1.030)
UROBILINOGEN UA: 0.2 mg/dL (ref 0.0–1.0)

## 2015-03-30 NOTE — Progress Notes (Signed)
Patient ID: Rhonda Fischer, female   DOB: 1990-11-30, 25 y.o.   MRN: [id number]

## 2015-03-30 NOTE — Progress Notes (Signed)
  Subjective:     Rhonda Fischer is a 25 y.o. female who presents for a postpartum visit. She is 5 weeks postpartum following a spontaneous vaginal delivery. I have fully reviewed the prenatal and intrapartum course. The delivery was at 39 gestational weeks. Outcome: spontaneous vaginal delivery. Postpartum course has been normal. Baby's course has been normal. Baby is feeding by breast. Bleeding no bleeding. Bowel function is normal. Bladder function is normal. Patient is not sexually active. Contraception method is none. Postpartum depression screening: negative.  The following portions of the patient's history were reviewed and updated as appropriate: allergies, current medications, past family history, past medical history, past social history, past surgical history and problem list.  Review of Systems A comprehensive review of systems was negative.   Objective:    BP 112/80 mmHg  Pulse 79  Temp(Src) 98 F (36.7 C)  Wt 159 lb 14.4 oz (72.53 kg)  General:  alert, cooperative and no distress   Breasts:  inspection negative, no nipple discharge or bleeding, no masses or nodularity palpable  Lungs: clear to auscultation bilaterally  Heart:  regular rate and rhythm, S1, S2 normal, no murmur, click, rub or gallop  Abdomen: soft, non-tender; bowel sounds normal; no masses,  no organomegaly        Assessment:     1. Postpartum care and examination   2. General counseling and advice for contraceptive management --Discussed contraceptive options, with LARCs presented as most effective. Pt prefers condoms for now, may call if desires new method.  Education materials sent home with pt.    Plan:    1. Contraception: condoms 2. Follow up in: 1 year or as needed.

## 2016-01-22 ENCOUNTER — Emergency Department (HOSPITAL_BASED_OUTPATIENT_CLINIC_OR_DEPARTMENT_OTHER)
Admission: EM | Admit: 2016-01-22 | Discharge: 2016-01-22 | Disposition: A | Discharge status: Home / Self Care | Payer: No Typology Code available for payment source | Attending: Emergency Medicine | Admitting: Emergency Medicine

## 2016-01-22 ENCOUNTER — Encounter (HOSPITAL_BASED_OUTPATIENT_CLINIC_OR_DEPARTMENT_OTHER): Payer: Self-pay | Admitting: Emergency Medicine

## 2016-01-22 DIAGNOSIS — S29012A Strain of muscle and tendon of back wall of thorax, initial encounter: Secondary | ICD-10-CM | POA: Insufficient documentation

## 2016-01-22 DIAGNOSIS — Y9389 Activity, other specified: Secondary | ICD-10-CM | POA: Diagnosis not present

## 2016-01-22 DIAGNOSIS — Y999 Unspecified external cause status: Secondary | ICD-10-CM | POA: Diagnosis not present

## 2016-01-22 DIAGNOSIS — Y92410 Unspecified street and highway as the place of occurrence of the external cause: Secondary | ICD-10-CM | POA: Diagnosis not present

## 2016-01-22 DIAGNOSIS — S3992XA Unspecified injury of lower back, initial encounter: Secondary | ICD-10-CM | POA: Diagnosis present

## 2016-01-22 DIAGNOSIS — S39012A Strain of muscle, fascia and tendon of lower back, initial encounter: Secondary | ICD-10-CM | POA: Insufficient documentation

## 2016-01-22 MED ORDER — ACETAMINOPHEN 500 MG PO TABS
1000.0000 mg | ORAL_TABLET | Freq: Once | ORAL | Status: AC
  Administered 2016-01-22: 1000 mg via ORAL
  Filled 2016-01-22: qty 2
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
## 2016-04-11 ENCOUNTER — Telehealth: Payer: Self-pay

## 2016-04-12 ENCOUNTER — Encounter: Payer: Self-pay | Admitting: Family Medicine

## 2016-04-12 ENCOUNTER — Ambulatory Visit (INDEPENDENT_AMBULATORY_CARE_PROVIDER_SITE_OTHER): Payer: 59 | Admitting: Family Medicine

## 2016-04-12 VITALS — BP 106/71 | HR 94 | Temp 98.3°F | Wt 165.2 lb

## 2016-04-12 DIAGNOSIS — Z3009 Encounter for other general counseling and advice on contraception: Secondary | ICD-10-CM | POA: Diagnosis not present

## 2016-04-12 DIAGNOSIS — Z23 Encounter for immunization: Secondary | ICD-10-CM

## 2016-04-12 LAB — POCT URINE PREGNANCY: Preg Test, Ur: NEGATIVE

## 2016-04-12 MED ORDER — LEVONORGESTREL-ETHINYL ESTRAD 0.1-20 MG-MCG PO TABS: 1.0000 | ORAL_TABLET | Freq: Every day | ORAL | 11 refills | Status: DC

## 2016-04-12 NOTE — Progress Notes (Signed)
[facility] [address], [address] [place], [place] [address] [phone] [phone]  Date:  04/12/2016   Name:  Rhonda PollockDassaeve Altamirano   DOB:  23-Sep-1990   MRN:  [id number]  PCP:  No PCP Per Patient    Chief Complaint: Establish Care (Pt here to est care. Would like to discuss starting birth control. Would like flu vaccine. )   History of Present Illness:  Rhonda Fischer is a 26 y.o. very pleasant female patient who presents with the following:  Here today as a new patient- she is interested in contraception.  Her husband is here with her today and helps with language issues. [place] are from [place] and speak Cre[place]/ [place] at home She does not want depo, but would like something that is longer lasting than pills.  She used depo in the past and did not like the weight gain  No health problems. Never had HTN, DVT/ PE Her youngest child is 361 yo, her older child is 3 She would like to use OCP as a bridge to nexplaonon Her LMP was 12/27 No history of migraine with Aura  She is no longer nursing her youngest  She also notes some mild left sided LBP for the last few days, no dysuria or hematuria Her menses were nearly a month ago   Patient Active Problem List   Diagnosis Date Noted  . Language barrier 05/08/2012    Past Medical History:  Diagnosis Date  . GERD (gastroesophageal reflux disease)     Past Surgical History:  Procedure Laterality Date  . NO PAST SURGERIES      Social History  Substance Use Topics  . Smoking status: Never Smoker  . Smokeless tobacco: Not on file  . Alcohol use No    No family history on file.  No Known Allergies  Medication list has been reviewed and updated.  Current Outpatient Prescriptions on File Prior to Visit  Medication Sig Dispense Refill  . acetaminophen (TYLENOL) 325 MG tablet Take 2 tablets (650 mg total) by mouth every 4 (four) hours as needed (for pain scale < 4). 90 tablet 3  . ibuprofen  (ADVIL,MOTRIN) 600 MG tablet Take 1 tablet (600 mg total) by mouth every 6 (six) hours. 30 tablet 0  . PRENATAL 27-1 MG TABS Take 1 tablet by mouth daily. (Patient not taking: Reported on 04/12/2016) 30 tablet 6   No current facility-administered medications on file prior to visit.     Review of Systems:  As per HPI- otherwise negative. No fever, chills, nausea, vomiting, rash   Physical Examination: Vitals:   04/12/16 1325  BP: 106/71  Pulse: 94  Temp: 98.3 F (36.8 C)   Vitals:   04/12/16 1325  Weight: 165 lb 3.2 oz (74.9 kg)   Body mass index is 26.26 kg/m. Ideal Body Weight:    GEN: WDWN, NAD, Non-toxic, A & O x 3, looks well HEENT: Atraumatic, Normocephalic. Neck supple. No masses, No LAD. Ears and Nose: No external deformity. CV: RRR, No M/G/R. No JVD. No thrill. No extra heart sounds. PULM: CTA B, no wheezes, crackles, rhonchi. No retractions. No resp. distress. No accessory muscle use. ABD: S, NT, ND EXTR: No c/c/e NEURO Normal gait.  PSYCH: Normally interactive. Conversant. Not depressed or anxious appearing.  Calm demeanor.  Mild tenderness over her left SI joint   Assessment and Plan: General counseling and advice for contraceptive management - Plan: POCT urine pregnancy, levonorgestrel-ethinyl estradiol (  AVIANE,ALESSE,LESSINA) 0.1-20 MG-MCG tablet  Results for orders placed or performed in visit on 04/12/16  POCT urine pregnancy  Result Value Ref Range   Preg Test, Ur Negative Negative    Here today to discuss contraception. She would like to have nexplanon placed - will have her schedule for this service.  For the time being will use OCP for pregnancy prevention.her menses are expected any day and suggested that she wait until her menses to start the OCP Suggested that she try ibuprofen for a few days for her lower back pain- she will let me know if this is not helpful Signed Abbe Amsterdam, MD

## 2016-04-12 NOTE — Addendum Note (Signed)
Addended by: Cammy CopaHANDLER, Mohd. Derflinger N on: 04/12/2016 03:45 PM   Modules accepted: Orders

## 2016-04-12 NOTE — Progress Notes (Signed)
Pre visit review using our clinic review tool, if applicable. No additional management support is needed unless otherwise documented below in the visit note. 

## 2016-04-12 NOTE — Patient Instructions (Addendum)
It was very nice to see you today!   We will start you on birth control pills today to protect against pregnancy while we get you scheduled to have nexplanon inserted.  Please schedule to see Dr. Carmelia RollerWendling to have your nexplanon placed- you will need a 30 minute appt.  Continue taking your birth control pill until you come in for your nexplanon  Please look over the hand-out regarding nexplanon and let us know if you have any questions!

## 2016-04-27 ENCOUNTER — Encounter: Payer: Self-pay | Admitting: Family Medicine

## 2016-04-27 ENCOUNTER — Ambulatory Visit (INDEPENDENT_AMBULATORY_CARE_PROVIDER_SITE_OTHER): Payer: 59 | Admitting: Family Medicine

## 2016-04-27 VITALS — BP 104/59 | HR 95 | Temp 98.7°F | Ht 67.0 in | Wt 166.6 lb

## 2016-04-27 DIAGNOSIS — Z3490 Encounter for supervision of normal pregnancy, unspecified, unspecified trimester: Secondary | ICD-10-CM

## 2016-04-27 DIAGNOSIS — Z3200 Encounter for pregnancy test, result unknown: Secondary | ICD-10-CM

## 2016-04-27 LAB — POCT URINE PREGNANCY: Preg Test, Ur: POSITIVE — AB

## 2016-04-27 NOTE — Progress Notes (Signed)
Pre visit review using our clinic review tool, if applicable. No additional management support is needed unless otherwise documented below in the visit note. 

## 2016-04-27 NOTE — Progress Notes (Signed)
The patient is accompanied by her husband who translates for her. She was originally here for next full on placement. She left a urine sample to be tested for pregnancy and on 2 occasions did test positive for pregnancy. We will not place the Nexplanon in light of this. She does have an OB/GYN that she is established with. She will make an appointment next week and discuss options. Her husband asked for options for abortion. Recommend [facility] or discussing it with the OB/GYN.  Follow-up as needed.  Jilda Rocheicholas Paul Holiday ShoresWend[place], DO 04/27/16 5:15 PM

## 2016-04-27 NOTE — Patient Instructions (Addendum)
[facility] is an option.  You can contact your OB/GYN for more information regarding this. You do not necessarily need to give any identifying information to obtain this information.  Let us know if you need anything.

## 2016-12-22 NOTE — Progress Notes (Signed)
[facility] at [facility] [address], [address] [place], [place] [address] [phone]  Date:  12/24/2016   Name:  Rhonda Fischer   DOB:  [birth year]   MRN:  [id number]  PCP:  Pearline Cables, MD    Chief Complaint: Follow-up   History of Present Illness:  Rhonda Fischer is a 26 y.o. very pleasant female patient who presents with the following:  Here today to discuss contraception. I saw her in January and put her on OCP as a bridge to nexplonon.  HPI as follows:  Here today as a new patient- she is interested in contraception.  Her husband is here with her today and helps with language issues. They are from [place] and speak Creole/ [place] at home She does not want depo, but would like something that is longer lasting than pills.  She used depo in the past and did not like the weight gain  No health problems. Never had HTN, DVT/ PE Her youngest child is 37 yo, her older child is 3 She would like to use OCP as a bridge to nexplaonon Her LMP was 12/27 No history of migraine with Aura  However when she appeared for her nexplanon visit about 10 days later with my partner Dr. Carmelia Roller she tested positive for pregnancy  She is here today with her husband and nearly 2 yo son.   She had a termination with her last pregnancy.  She would like to get nexplanon placed  Her LMP was last Thursday, finished today per her report   Patient Active Problem List   Diagnosis Date Noted  . Language barrier 05/08/2012    Past Medical History:  Diagnosis Date  . GERD (gastroesophageal reflux disease)     Past Surgical History:  Procedure Laterality Date  . NO PAST SURGERIES      Social History  Substance Use Topics  . Smoking status: Never Smoker  . Smokeless tobacco: Never Used  . Alcohol use No    No family history on file.  No Known Allergies  Medication list has been reviewed and updated.  Current Outpatient Prescriptions on  File Prior to Visit  Medication Sig Dispense Refill  . acetaminophen (TYLENOL) 325 MG tablet Take 2 tablets (650 mg total) by mouth every 4 (four) hours as needed (for pain scale < 4). 90 tablet 3  . ibuprofen (ADVIL,MOTRIN) 600 MG tablet Take 1 tablet (600 mg total) by mouth every 6 (six) hours. 30 tablet 0   No current facility-administered medications on file prior to visit.     Review of Systems:  As per HPI- otherwise negative.   Physical Examination: Vitals:   12/24/16 0910  BP: 114/71  Pulse: 78  Temp: 98.7 F (37.1 C)   Vitals:   12/24/16 0910  Weight: 168 lb 9.6 oz (76.5 kg)  Height:  (1.702 m)   Body mass index is 26.41 kg/m. Ideal Body Weight: Weight in (lb) to have BMI = 25: 159.3  GEN: WDWN, NAD, Non-toxic, A & O x 3, looks well HEENT: Atraumatic, Normocephalic. Neck supple. No masses, No LAD. Ears and Nose: No external deformity. CV: RRR, No M/G/R. No JVD. No thrill. No extra heart sounds. PULM: CTA B, no wheezes, crackles, rhonchi. No retractions. No resp. distress. No accessory muscle use. EXTR: No c/c/e NEURO Normal gait.  PSYCH: Normally interactive. Conversant. Not depressed or anxious appearing.  Calm demeanor.   Results for orders  placed or performed in visit on 12/24/16  POCT urine pregnancy  Result Value Ref Range   Preg Test, Ur Negative Negative    Assessment and Plan: General counseling and advice on contraceptive management - Plan: POCT urine pregnancy  Need for influenza vaccination - Plan: Flu Vaccine QUAD 6+ mos PF IM (Fluarix Quad PF)  Here today for her flu shot. She also would like to have nexplanon- tried to have this earlier this year but she was pregnant. Had a termination- no longer pregnant. LMP was last week and HCG is negative today Dr. Carmelia Roller kindly agreed to see her later this morning to place her nexplanon No charge for my visit today   Signed Abbe Amsterdam, MD

## 2016-12-24 ENCOUNTER — Encounter: Payer: Self-pay | Admitting: Family Medicine

## 2016-12-24 ENCOUNTER — Ambulatory Visit (INDEPENDENT_AMBULATORY_CARE_PROVIDER_SITE_OTHER): Payer: 59 | Admitting: Family Medicine

## 2016-12-24 VITALS — BP 114/71 | HR 78 | Temp 98.7°F | Ht 67.0 in | Wt 168.0 lb

## 2016-12-24 VITALS — BP 114/71 | HR 78 | Temp 98.7°F | Ht 67.0 in | Wt 168.6 lb

## 2016-12-24 DIAGNOSIS — Z3009 Encounter for other general counseling and advice on contraception: Secondary | ICD-10-CM

## 2016-12-24 DIAGNOSIS — Z30017 Encounter for initial prescription of implantable subdermal contraceptive: Secondary | ICD-10-CM

## 2016-12-24 DIAGNOSIS — Z23 Encounter for immunization: Secondary | ICD-10-CM | POA: Diagnosis not present

## 2016-12-24 LAB — POCT URINE PREGNANCY: Preg Test, Ur: NEGATIVE

## 2016-12-24 MED ORDER — ETONOGESTREL 68 MG ~~LOC~~ IMPL
68.0000 mg | DRUG_IMPLANT | Freq: Once | SUBCUTANEOUS | Status: AC
  Administered 2016-12-24: 68 mg via SUBCUTANEOUS

## 2016-12-24 NOTE — Patient Instructions (Signed)
No showering for rest of day.

## 2016-12-24 NOTE — Progress Notes (Signed)
Chief Complaint  Patient presents with  . Follow-up    insertion of nexplanon   The patient saw her regular PCP Dr. Patsy Lager morning. She asked me if we could place a Nexplanon today. After a negative pregnancy test, the patient is here for that visit.  Procedure Note, Nexplanon placement: Informed consent obtained. The patient was placed in a comfortable supine position and the non-dominant arm, left, was positioned to access the sulcus between the bicep and triceps muscles.  Measurements were made, and markings were placed at 8 cm, 10 cm, 12 cm and 14 cm from the medial epicondyle of the humerus.  The area was prepped with betadine and a 27 gauge needle was used to inject 3 mL of 1% lidocaine with epinephrine.  Sterile gloves were then utilized. The Nexplanon trocar was then inserted at the end of anesthetized area and pushed gently through the subcutaneous tissue along the line of the sulcus.  The seal was broken and the implant was held in place while the trocar was withdrawn.  The implant was then palpated by both the patient and me.  The arm was cleansed and the puncture site from the trocar covered with triple antibiotic ointment and pressure dressing.  Hemostasis was observed at the site. There were no complications noted.  The patient tolerated the procedure well.  She was instructed that the device must be removed in three years.  Nexplanon insertion - Plan: etonogestrel (NEXPLANON) implant 68 mg  Aftercare instructions were verbalized. No unprotected intercourse over the next 7 days or use backup contraception. Follow-up as originally scheduled with regular PCP. The patient and her husband voiced understanding and agreement to the plan.  Jilda Roche [place], DO 12/24/16 12:10 PM

## 2017-01-15 NOTE — Telephone Encounter (Signed)
Completed.

## 2018-05-01 ENCOUNTER — Encounter: Payer: Self-pay | Admitting: Family Medicine

## 2018-05-01 ENCOUNTER — Ambulatory Visit (INDEPENDENT_AMBULATORY_CARE_PROVIDER_SITE_OTHER): Payer: 59 | Admitting: Family Medicine

## 2018-05-01 DIAGNOSIS — Z3046 Encounter for surveillance of implantable subdermal contraceptive: Secondary | ICD-10-CM | POA: Diagnosis not present

## 2018-05-01 NOTE — Patient Instructions (Signed)
Do not shower for the rest of the day. When you do wash it, use only soap and water. Do not vigorously scrub. Keep the area clean and dry.   Things to look out for: increasing pain not relieved by ibuprofen/acetaminophen, fevers, spreading redness, drainage of pus, or foul odor.  Let us know if you need anything.   

## 2018-05-01 NOTE — Progress Notes (Signed)
Implant placed by me in 2018. Does not like, wishes for removal. Here w spouse who helps with interpretation.   Procedure note; Nexplanon removal Informed consent obtained. The patient was placed in the supine position with her left arm abducted and external rotated, exposing the medial surface of her arm. The Nexplanon was palpated and the distal edge was elevated with pressure to the proximal end. This area was then cleaned with an alcohol swab. Approximately 2 cc of 1% lidocaine with epinephrine was injected deep to the palpated trocar. While pressure was applied to the proximal side of the trocar, an 11-blade scalpel was used to make a small incision over the anesthetized area, parallel to the trocar. The trocar was visualized. It was grasped with a curved hemostat and removed.  The area was wrapped with a bandaid and pressure bandage. Adequate hemostasis was obtained. The patient tolerated the procedure well. There were no complications noted. Aftercare instructions verbalized and written down. Warning signs and symptoms verbalized and written down in AVS.  F/u w reg PCP prn.  Pt and spouse voiced understanding and agreement to the plan.  Jilda Roche Jakarius Flamenco 3:57 PM 05/01/18

## 2018-12-09 ENCOUNTER — Ambulatory Visit (INDEPENDENT_AMBULATORY_CARE_PROVIDER_SITE_OTHER): Payer: 59

## 2018-12-09 ENCOUNTER — Other Ambulatory Visit: Payer: Self-pay

## 2018-12-09 ENCOUNTER — Encounter: Payer: Self-pay | Admitting: Student

## 2018-12-09 DIAGNOSIS — Z3201 Encounter for pregnancy test, result positive: Secondary | ICD-10-CM | POA: Diagnosis not present

## 2018-12-09 DIAGNOSIS — Z3200 Encounter for pregnancy test, result unknown: Secondary | ICD-10-CM

## 2018-12-09 LAB — POCT PREGNANCY, URINE: Preg Test, Ur: POSITIVE — AB

## 2018-12-09 MED ORDER — PRENATAL PLUS 27-1 MG PO TABS: 1.0000 | ORAL_TABLET | Freq: Every day | ORAL | 11 refills | Status: DC

## 2018-12-09 NOTE — Progress Notes (Signed)
Pt here today for UPT; resulted positive. EDD 06/21/18 based off of LMP 09/14/18. 12w 2d today. Medications and allergies reviewed, pt denies taking any medications regularly. List of medications safe to take during pregnancy reviewed with patient. Pt notified that front office will provide proof of pregnancy letter so she may begin prenatal care. Patient verbalized understanding.  Prenatal rx sent to preferred pharmacy.    Apolonio Schneiders, RN 12/09/18

## 2018-12-10 NOTE — Progress Notes (Signed)
Chart reviewed for nurse visit. Agree with plan of care.   Starr Lake, CNM 12/10/2018 12:13 PM

## 2018-12-29 ENCOUNTER — Ambulatory Visit (INDEPENDENT_AMBULATORY_CARE_PROVIDER_SITE_OTHER): Payer: 59 | Admitting: *Deleted

## 2018-12-29 ENCOUNTER — Other Ambulatory Visit: Payer: Self-pay

## 2018-12-29 DIAGNOSIS — Z3490 Encounter for supervision of normal pregnancy, unspecified, unspecified trimester: Secondary | ICD-10-CM | POA: Insufficient documentation

## 2018-12-29 DIAGNOSIS — Z3200 Encounter for pregnancy test, result unknown: Secondary | ICD-10-CM

## 2018-12-29 MED ORDER — PRENATAL PLUS 27-1 MG PO TABS: 1.0000 | ORAL_TABLET | Freq: Every day | ORAL | 11 refills | Status: AC

## 2018-12-29 NOTE — Progress Notes (Signed)
I connected with  Rhonda Fischer on 12/29/18 at  1:30 PM EDT by telephone and verified that I am speaking with the correct person using two identifiers.   I discussed the limitations, risks, security and privacy concerns of performing an evaluation and management service by telephone and the availability of in person appointments. I also discussed with the patient that there may be a patient responsible charge related to this service. The patient expressed understanding and agreed to proceed. Explained I am completing her New OB Intake today. She states she speaks [place] and declined an interpreter.  We discussed Her EDD and that it is based on  sure LMP . I reviewed her allergies, meds, OB History, Medical /Surgical history, and appropriate screenings. She asked if I could send her RX for PNV to a different pharmacy and I informed her I will. I called [facility] and cancelled rx and sent to [facility] as requested. I explained we will send her Bab[employer] app- app sent to her while on phone.  Asked if she could purchase  a blood pressure cuff to check her blood pressure weekly at home.I instructed her if she needs help with how to operate the cuff to let us know and we will assist her. I explained  then we will have her take her blood pressure weekly and enter into the app. Explained she will have some visits in office and some virtually. I sent her the  MyChart text and she will download the  app. Reviewed appointment date/ time with her , our location and to wear mask, no visitors. Explained she will have exam, ob bloodwork, hemoglobin a1C, cbg , genetic testing if desired, pap if needed. I scheduled and  [place] at 19 weeks and gave her the appointment.  She voices understanding.   Mckinzee Spirito,RN 12/29/2018  1:33 PM

## 2018-12-30 ENCOUNTER — Ambulatory Visit (INDEPENDENT_AMBULATORY_CARE_PROVIDER_SITE_OTHER): Payer: 59 | Admitting: Student

## 2018-12-30 VITALS — BP 113/75 | HR 123 | Wt 160.6 lb

## 2018-12-30 DIAGNOSIS — Z3482 Encounter for supervision of other normal pregnancy, second trimester: Secondary | ICD-10-CM | POA: Diagnosis not present

## 2018-12-30 DIAGNOSIS — Z1151 Encounter for screening for human papillomavirus (HPV): Secondary | ICD-10-CM | POA: Diagnosis not present

## 2018-12-30 DIAGNOSIS — Z124 Encounter for screening for malignant neoplasm of cervix: Secondary | ICD-10-CM

## 2018-12-30 DIAGNOSIS — Z31430 Encounter of female for testing for genetic disease carrier status for procreative management: Secondary | ICD-10-CM | POA: Diagnosis not present

## 2018-12-30 DIAGNOSIS — Z113 Encounter for screening for infections with a predominantly sexual mode of transmission: Secondary | ICD-10-CM | POA: Diagnosis not present

## 2018-12-30 DIAGNOSIS — Z3490 Encounter for supervision of normal pregnancy, unspecified, unspecified trimester: Secondary | ICD-10-CM

## 2018-12-30 DIAGNOSIS — Z3492 Encounter for supervision of normal pregnancy, unspecified, second trimester: Secondary | ICD-10-CM

## 2018-12-30 DIAGNOSIS — Z23 Encounter for immunization: Secondary | ICD-10-CM | POA: Diagnosis not present

## 2018-12-30 DIAGNOSIS — Z3A15 15 weeks gestation of pregnancy: Secondary | ICD-10-CM

## 2018-12-30 NOTE — Progress Notes (Signed)
  Subjective:    Rhonda Fischer is being seen today for her first obstetrical visit.  This is not a planned pregnancy. She is at [redacted]w[redacted]d gestation. Her obstetrical history is significant for nothing. She denies blood pressure  or gestaitional diabetes in the past. . Had two normal NSVDs. Relationship with FOB: spouse, living together. Patient does intend to breast feed. Pregnancy history fully reviewed.  Patient reports no complaints. She has heartburn and wants some medicine for this. She is not throwing up.   Review of Systems:   Review of Systems  Constitutional: Negative.   HENT: Negative.   Respiratory: Negative.   Cardiovascular: Negative.   Genitourinary: Negative.   Hematological: Negative.   Psychiatric/Behavioral: Negative.     Objective:     BP 113/75   Pulse (!) 123   Wt 160 lb 9.6 oz (72.8 kg)   LMP 09/14/2018   BMI 25.15 kg/m  Physical Exam  Constitutional: She is oriented to person, place, and time. She appears well-developed and well-nourished.  HENT:  Head: Normocephalic.  Neck: Normal range of motion.  Respiratory: Effort normal.  GI: Soft.  Neurological: She is alert and oriented to person, place, and time.  Skin: Skin is warm and dry.  Psychiatric: She has a normal mood and affect.    Exam    Assessment:    Pregnancy: [id number] Patient Active Problem List   Diagnosis Date Noted  . Supervision of low-risk pregnancy 12/29/2018  . Language barrier 05/08/2012       Plan:     Initial labs drawn. Prenatal vitamins. Problem list reviewed and updated. AFP3 discussed: will do at [place] appt. . Role of ultrasound in pregnancy discussed; fetal survey: ordered. Amniocentesis discussed: not indicated. Follow up in 5 weeks. 75 % of 30 min visit spent on counseling and coordination of care.  -repeat Pap today -Does not want birth control  -Signed up for MyChart and BabyRx -will buy her own BP cuff because she has insurance; does not need free cuff from  [facility].  -genetic testing today.   Mervyn Skeeters Nicolai Labonte 12/30/2018

## 2018-12-30 NOTE — Progress Notes (Signed)
Chart reviewed for nurse visit. Agree with plan of care.   Starr Lake, [place] 12/30/2018 2:11 PM

## 2018-12-30 NOTE — Progress Notes (Signed)
Chart reviewed for nurse visit. Agree with plan of care.   Sonakshi Rolland [place], CNM 12/30/2018 2:11 PM   

## 2018-12-31 LAB — OBSTETRIC PANEL, INCLUDING HIV
Antibody Screen: NEGATIVE
Basophils Absolute: 0 10*3/uL (ref 0.0–0.2)
Basos: 0 %
EOS (ABSOLUTE): 0.2 10*3/uL (ref 0.0–0.4)
Eos: 2 %
HIV Screen 4th Generation wRfx: NONREACTIVE
Hematocrit: 41.3 % (ref 34.0–46.6)
Hemoglobin: 13.2 g/dL (ref 11.1–15.9)
Hepatitis B Surface Ag: NEGATIVE
Immature Grans (Abs): 0 10*3/uL (ref 0.0–0.1)
Immature Granulocytes: 0 %
Lymphocytes Absolute: 1.8 10*3/uL (ref 0.7–3.1)
Lymphs: 18 %
MCH: 25.6 pg — ABNORMAL LOW (ref 26.6–33.0)
MCHC: 32 g/dL (ref 31.5–35.7)
MCV: 80 fL (ref 79–97)
Monocytes Absolute: 0.8 10*3/uL (ref 0.1–0.9)
Monocytes: 8 %
Neutrophils Absolute: 7.2 10*3/uL — ABNORMAL HIGH (ref 1.4–7.0)
Neutrophils: 72 %
Platelets: 209 10*3/uL (ref 150–450)
RBC: 5.15 x10E6/uL (ref 3.77–5.28)
RDW: 13.2 % (ref 11.7–15.4)
RPR Ser Ql: NONREACTIVE
Rh Factor: POSITIVE
Rubella Antibodies, IGG: 31.9 index (ref >0.99)
WBC: 10.1 10*3/uL (ref 3.4–10.8)

## 2018-12-31 LAB — HEMOGLOBIN A1C
Est. average glucose Bld gHb Est-mCnc: 117 mg/dL
Hgb A1c MFr Bld: 5.7 % — ABNORMAL HIGH (ref 4.8–5.6)

## 2019-01-01 LAB — URINE CULTURE, OB REFLEX

## 2019-01-01 LAB — CULTURE, OB URINE

## 2019-01-09 LAB — CYTOLOGY - PAP
Chlamydia: NEGATIVE
Comment: NEGATIVE
Comment: NEGATIVE
Comment: NORMAL
Diagnosis: NEGATIVE
High risk HPV: NEGATIVE
Neisseria Gonorrhea: NEGATIVE

## 2019-01-13 ENCOUNTER — Encounter: Payer: Self-pay | Admitting: *Deleted

## 2019-01-22 ENCOUNTER — Encounter: Payer: Self-pay | Admitting: *Deleted

## 2019-01-27 ENCOUNTER — Ambulatory Visit (HOSPITAL_COMMUNITY)
Admission: RE | Admit: 2019-01-27 | Discharge: 2019-01-27 | Disposition: A | Discharge status: Home / Self Care | Payer: 59 | Source: Ambulatory Visit | Attending: Obstetrics and Gynecology | Admitting: Obstetrics and Gynecology

## 2019-01-27 ENCOUNTER — Other Ambulatory Visit (HOSPITAL_COMMUNITY): Payer: Self-pay | Admitting: *Deleted

## 2019-01-27 ENCOUNTER — Other Ambulatory Visit: Payer: Self-pay

## 2019-01-27 ENCOUNTER — Other Ambulatory Visit: Payer: Self-pay | Admitting: Student

## 2019-01-27 DIAGNOSIS — Z3490 Encounter for supervision of normal pregnancy, unspecified, unspecified trimester: Secondary | ICD-10-CM | POA: Insufficient documentation

## 2019-01-27 DIAGNOSIS — Z3A17 17 weeks gestation of pregnancy: Secondary | ICD-10-CM | POA: Diagnosis not present

## 2019-01-27 DIAGNOSIS — O350XX0 Maternal care for (suspected) central nervous system malformation in fetus, not applicable or unspecified: Secondary | ICD-10-CM

## 2019-01-27 DIAGNOSIS — O3503X0 Maternal care for (suspected) central nervous system malformation or damage in fetus, choroid plexus cysts, not applicable or unspecified: Secondary | ICD-10-CM

## 2019-01-27 DIAGNOSIS — O358XX0 Maternal care for other (suspected) fetal abnormality and damage, not applicable or unspecified: Secondary | ICD-10-CM

## 2019-02-03 ENCOUNTER — Other Ambulatory Visit: Payer: Self-pay

## 2019-02-03 ENCOUNTER — Telehealth (INDEPENDENT_AMBULATORY_CARE_PROVIDER_SITE_OTHER): Payer: 59 | Admitting: Student

## 2019-02-03 DIAGNOSIS — Z3492 Encounter for supervision of normal pregnancy, unspecified, second trimester: Secondary | ICD-10-CM

## 2019-02-03 DIAGNOSIS — Z3A18 18 weeks gestation of pregnancy: Secondary | ICD-10-CM

## 2019-02-03 MED ORDER — FAMOTIDINE 20 MG PO TABS: 20.0000 mg | ORAL_TABLET | Freq: Two times a day (BID) | ORAL | 3 refills | Status: DC

## 2019-02-03 NOTE — Progress Notes (Signed)
I connected with  Albertina Senegal on 02/03/19 at  1:55 PM EST by telephone and verified that I am speaking with the correct person using two identifiers.   I discussed the limitations, risks, security and privacy concerns of performing an evaluation and management service by telephone and the availability of in person appointments. I also discussed with the patient that there may be a patient responsible charge related to this service. The patient expressed understanding and agreed to proceed.  Bethanne Ginger, CMA 02/03/2019  1:56 PM

## 2019-02-03 NOTE — Progress Notes (Signed)
I connected with@ on 02/03/19 at  1:55 PM EST by: My chart and verified that I am speaking with the correct person using two identifiers.  Patient is located at home and provider is located at home.     The purpose of this virtual visit is to provide medical care while limiting exposure to the novel coronavirus. I discussed the limitations, risks, security and privacy concerns of performing an evaluation and management service by My Chart and the availability of in person appointments. I also discussed with the patient that there may be a patient responsible charge related to this service. By engaging in this virtual visit, you consent to the provision of healthcare.  Additionally, you authorize for your insurance to be billed for the services provided during this visit.  The patient expressed understanding and agreed to proceed.  The following staff members participated in the virtual visit:  Carver Fila    PRENATAL VISIT NOTE  Subjective:  Rhonda Fischer is a 28 y.o. [id number] at [redacted]w[redacted]d  for phone visit for ongoing prenatal care.  She is currently monitored for the following issues for this low-risk pregnancy and has Language barrier and Supervision of low-risk pregnancy on their problem list.  Patient reports every day she has itching and white discharge from her vagina. She denies odor. It has been going on for two weeks. She has not tried anything for it. .  Contractions: Not present. Vag. Bleeding: None.  Movement: Present. Denies leaking of fluid.   The following portions of the patient's history were reviewed and updated as appropriate: allergies, current medications, past family history, past medical history, past social history, past surgical history and problem list.   Objective:  There were no vitals filed for this visit. Self-Obtained  Fetal Status:     Movement: Present     Assessment and Plan:  Pregnancy: G3P2002 at [redacted]w[redacted]d 1. Encounter for supervision of low-risk pregnancy in second  trimester -patient to have lab visit for wet prep and AFP this week -reviewed that her next in person visit will have 2 hour GTT; reminded her not to eat or drink anything.  -patient will purchase her BP cuff; patient knows how to take BP.   Preterm labor symptoms and general obstetric precautions including but not limited to vaginal bleeding, contractions, leaking of fluid and fetal movement were reviewed in detail with the patient.  Return in about 1 day (around 02/04/2019), or ASAP for lab visit for AFP and wet prep this week; 8 weeks for LROB and 2 hour GTT.  Future Appointments  Date Time Provider [place]  02/24/2019  9:30 AM WH-MFC [place] 1 WH-MFCUS MFC-US  02/24/2019  9:40 AM [place] [facility]-US     Time spent on virtual visit: 12 minutes  Starr Lake, CNM

## 2019-02-04 ENCOUNTER — Other Ambulatory Visit: Payer: 59

## 2019-02-06 ENCOUNTER — Other Ambulatory Visit: Payer: 59

## 2019-02-06 ENCOUNTER — Other Ambulatory Visit: Payer: Self-pay

## 2019-02-06 DIAGNOSIS — Z3492 Encounter for supervision of normal pregnancy, unspecified, second trimester: Secondary | ICD-10-CM | POA: Diagnosis not present

## 2019-02-08 LAB — AFP, SERUM, OPEN SPINA BIFIDA
AFP MoM: 0.69
AFP Value: 33.9 ng/mL
Gest. Age on Collection Date: 18.6 weeks
Maternal Age At EDD: 30.9 yr
OSBR Risk 1 IN: 10000
Test Results:: NEGATIVE
Weight: 163 [lb_av]

## 2019-02-24 ENCOUNTER — Encounter (HOSPITAL_COMMUNITY): Payer: Self-pay

## 2019-02-24 ENCOUNTER — Ambulatory Visit (HOSPITAL_COMMUNITY)
Admission: RE | Admit: 2019-02-24 | Discharge: 2019-02-24 | Disposition: A | Discharge status: Home / Self Care | Payer: 59 | Source: Ambulatory Visit | Attending: Obstetrics and Gynecology | Admitting: Obstetrics and Gynecology

## 2019-02-24 ENCOUNTER — Ambulatory Visit (HOSPITAL_COMMUNITY): Payer: 59 | Admitting: *Deleted

## 2019-02-24 ENCOUNTER — Other Ambulatory Visit: Payer: Self-pay

## 2019-02-24 VITALS — BP 117/64 | HR 83 | Temp 97.7°F

## 2019-02-24 DIAGNOSIS — O350XX0 Maternal care for (suspected) central nervous system malformation in fetus, not applicable or unspecified: Secondary | ICD-10-CM | POA: Insufficient documentation

## 2019-02-24 DIAGNOSIS — O3503X0 Maternal care for (suspected) central nervous system malformation or damage in fetus, choroid plexus cysts, not applicable or unspecified: Secondary | ICD-10-CM

## 2019-02-24 DIAGNOSIS — Z362 Encounter for other antenatal screening follow-up: Secondary | ICD-10-CM

## 2019-02-24 DIAGNOSIS — Z3A21 21 weeks gestation of pregnancy: Secondary | ICD-10-CM

## 2019-03-20 NOTE — L&D Delivery Note (Addendum)
OB/GYN Faculty Practice Delivery Note  Rhonda Fischer is a 29 y.o. [id number] s/p SVD at [redacted]w[redacted]d. She was admitted for IOL for postdates and NRNST in clinic on 4/22.   ROM: 6h 22m with clear fluid GBS Status: Positive/-- (03/23 1523), appropriate abx given with PCN Maximum Maternal Temperature: 99.5F  Labor Progress: . Initial SVE: AROM @ 1246 incidentally with foley bulb placement. She was given misopristol x 1, a foley bulb was inserted during admission, but due to one episode of prolonged decel, the foley bulb was removed and the prolonged decel went to normal HR and accels. Pitocin was started at 1748. She then progressed to complete.   Delivery Date/Time: 07/09/19 @ 1945 Delivery: Called to room and patient was complete and pushing. Head delivered LOA. Nuchal cord x1 easily reducible. Shoulder and body delivered in usual fashion. Infant with spontaneous cry, placed on mother's abdomen, dried and stimulated. Cord clamped x 2 after 1-minute delay, and cut by father. Cord blood drawn. Placenta delivered spontaneously with gentle cord traction. Fundus firm with massage and Pitocin. Labia, perineum, vagina, and cervix inspected.  Baby Weight: pending  Placenta: Sent to L&D Complications: Nuchal x 1 easily reducible Lacerations: None EBL: 120 mL Analgesia: None  Infant:  APGAR (1 MIN): 7  APGAR (5 MINS): [address] DO, PGY1 07/09/2019, 7:56 PM  OB FELLOW DELIVERY ATTESTATION  I was gloved and present for the delivery in its entirety, and I agree with the above resident's note.    Jerilynn Birkenhead, MD [facility] Family Medicine Fellow, [facility] for [employer], [facility]

## 2019-03-30 ENCOUNTER — Other Ambulatory Visit: Payer: Self-pay | Admitting: *Deleted

## 2019-03-30 DIAGNOSIS — Z3490 Encounter for supervision of normal pregnancy, unspecified, unspecified trimester: Secondary | ICD-10-CM

## 2019-03-31 ENCOUNTER — Ambulatory Visit (INDEPENDENT_AMBULATORY_CARE_PROVIDER_SITE_OTHER): Payer: Self-pay | Admitting: Nurse Practitioner

## 2019-03-31 ENCOUNTER — Other Ambulatory Visit: Payer: Self-pay

## 2019-03-31 DIAGNOSIS — Z3490 Encounter for supervision of normal pregnancy, unspecified, unspecified trimester: Secondary | ICD-10-CM

## 2019-03-31 DIAGNOSIS — Z23 Encounter for immunization: Secondary | ICD-10-CM

## 2019-03-31 DIAGNOSIS — Z3492 Encounter for supervision of normal pregnancy, unspecified, second trimester: Secondary | ICD-10-CM

## 2019-03-31 DIAGNOSIS — Z3A26 26 weeks gestation of pregnancy: Secondary | ICD-10-CM

## 2019-03-31 NOTE — Patient Instructions (Signed)
Postpartum Tubal Ligation Postpartum tubal ligation (PPTL) is a procedure to close the fallopian tubes. This is done so that you cannot get pregnant. When the fallopian tubes are closed, the eggs that the ovaries release cannot enter the uterus, and sperm cannot reach the eggs. PPTL is done right after childbirth or 1-2 days after childbirth, before the uterus returns to its normal location. If you have a cesarean section, it can be performed at the same time as the procedure. Having this done after childbirth does not make your stay in the hospital longer. PPTL is sometimes called "getting your tubes tied." You should not have this procedure if you want to get pregnant again or if you are unsure about having more children. Tell a health care provider about:  Any allergies you have.  All medicines you are taking, including vitamins, herbs, eye drops, creams, and over-the-counter medicines.  Any problems you or family members have had with anesthetic medicines.  Any blood disorders you have.  Any surgeries you have had.  Any medical conditions you have or have had.  Any past pregnancies. What are the risks? Generally, this is a safe procedure. However, problems may occur, including:  Infection.  Bleeding.  Injury to other organs in the abdomen.  Side effects from anesthetic medicines.  Failure of the procedure. If this happens, you could get pregnant.  Having a fertilized egg attach outside the uterus (ectopic pregnancy). What happens before the procedure?  Ask your health care provider about: ? How much pain you can expect to have. ? What medicines you will be given for pain, especially if you are planning to breastfeed. What happens during the procedure? If you had a vaginal delivery:  You will be given one or more of the following: ? A medicine to help you relax (sedative). ? A medicine to numb the area (local anesthetic). ? A medicine to make you fall asleep (general  anesthetic). ? A medicine that is injected into an area of your body to numb everything below the injection site (regional anesthetic).  If you have been given a general anesthetic, a tube will be put down your throat to help you breathe.  An IV will be inserted into one of your veins.  Your bladder may be emptied with a small tube (catheter).  An incision will be made just below your belly button.  Your fallopian tubes will be located and brought up through the incision.  Your fallopian tubes will be tied off, burned (cauterized), or blocked with a clip, ring, or clamp. A small part in the center of each fallopian tube may be removed.  The incision will be closed with stitches (sutures).  A bandage (dressing) will be placed over the incision. If you had a cesarean delivery:  Tubal ligation will be done through the incision that was used for the cesarean delivery of your baby.  The incision will be closed with sutures.  A dressing will be placed over the incision. The procedure may vary among health care providers and hospitals. What happens after the procedure?  Your blood pressure, heart rate, breathing rate, and blood oxygen level will be monitored until you leave the hospital.  You will be given pain medicine as needed.  Do not drive for 24 hours if you were given a sedative during your procedure. Summary  Postpartum tubal ligation is a procedure that closes the fallopian tubes so you cannot get pregnant anymore.  This procedure is done while you are still   in the hospital after childbirth. If you have a cesarean section, it can be performed at the same time.  Having this done after childbirth does not make your stay in the hospital longer.  Postpartum tubal ligation is considered permanent. You should not have this procedure if you want to get pregnant again or if you are unsure about having more children.  Talk to your health care provider to see if this procedure is  right for you. This information is not intended to replace advice given to you by your health care provider. Make sure you discuss any questions you have with your health care provider. Document Revised: 08/18/2018 Document Reviewed: 01/23/2018 Elsevier Patient Education  2020 [employer]  

## 2019-03-31 NOTE — Progress Notes (Signed)
    Subjective:  Rhonda Fischer is a 29 y.o. G3P2002 at [redacted]w[redacted]d being seen today for ongoing prenatal care.  She is currently monitored for the following issues for this low-risk pregnancy and has Language barrier and Supervision of low-risk pregnancy on their problem list.  Patient reports no complaints.  Contractions: Not present. Vag. Bleeding: None.  Movement: Present. Denies leaking of fluid.   The following portions of the patient's history were reviewed and updated as appropriate: allergies, current medications, past family history, past medical history, past social history, past surgical history and problem list. Problem list updated.  Objective:   Vitals:   03/31/19 0937  BP: 122/75  Pulse: (!) 106  Weight: 165 lb 11.2 oz (75.2 kg)    Fetal Status: Fetal Heart Rate (bpm): 157 Fundal Height: 25 cm Movement: Present     General:  Alert, oriented and cooperative. Patient is in no acute distress.  Skin: Skin is warm and dry. No rash noted.   Cardiovascular: Normal heart rate noted  Respiratory: Normal respiratory effort, no problems with respiration noted  Abdomen: Soft, gravid, appropriate for gestational age. Pain/Pressure: Absent     Pelvic:  Cervical exam deferred        Extremities: Normal range of motion.  Edema: None  Mental Status: Normal mood and affect. Normal behavior. Normal judgment and thought content.   Urinalysis:      Assessment and Plan:  Pregnancy: G3P2002 at [redacted]w[redacted]d  1. Encounter for supervision of low-risk pregnancy in second trimester Given BP cuff, take BP weekly and advised to enter into [employer] Thinking about BTL as contraception.  Planning to discuss with her partner.   Does not want any more children.  Preterm labor symptoms and general obstetric precautions including but not limited to vaginal bleeding, contractions, leaking of fluid and fetal movement were reviewed in detail with the patient. Please refer to After Visit Summary for other  counseling recommendations.  Return in about 2 weeks (around 04/14/2019) for in person visit.  Client requested in person visit.  Nolene Bernheim, RN, MSN, NP-BC Nurse Practitioner, [facility] for [employer], [facility] 03/31/2019 8:06 PM

## 2019-04-01 LAB — CBC
Hematocrit: 38.1 % (ref 34.0–46.6)
Hemoglobin: 12.6 g/dL (ref 11.1–15.9)
MCH: 26.7 pg (ref 26.6–33.0)
MCHC: 33.1 g/dL (ref 31.5–35.7)
MCV: 81 fL (ref 79–97)
Platelets: 173 10*3/uL (ref 150–450)
RBC: 4.72 x10E6/uL (ref 3.77–5.28)
RDW: 13.9 % (ref 11.7–15.4)
WBC: 8.7 10*3/uL (ref 3.4–10.8)

## 2019-04-01 LAB — GLUCOSE TOLERANCE, 2 HOURS W/ 1HR
Glucose, 1 hour: 117 mg/dL (ref 65–179)
Glucose, 2 hour: 116 mg/dL (ref 65–152)
Glucose, Fasting: 80 mg/dL (ref 65–91)

## 2019-04-01 LAB — RPR: RPR Ser Ql: NONREACTIVE

## 2019-04-01 LAB — HIV ANTIBODY (ROUTINE TESTING W REFLEX): HIV Screen 4th Generation wRfx: NONREACTIVE

## 2019-04-15 ENCOUNTER — Encounter (HOSPITAL_COMMUNITY): Payer: Self-pay

## 2019-04-15 ENCOUNTER — Other Ambulatory Visit: Payer: Self-pay

## 2019-04-15 ENCOUNTER — Emergency Department (HOSPITAL_COMMUNITY)
Admission: EM | Admit: 2019-04-15 | Discharge: 2019-04-15 | Disposition: A | Discharge status: Home / Self Care | Payer: No Typology Code available for payment source | Attending: Emergency Medicine | Admitting: Emergency Medicine

## 2019-04-15 DIAGNOSIS — Z79899 Other long term (current) drug therapy: Secondary | ICD-10-CM | POA: Insufficient documentation

## 2019-04-15 DIAGNOSIS — R55 Syncope and collapse: Secondary | ICD-10-CM | POA: Insufficient documentation

## 2019-04-15 DIAGNOSIS — Z3A00 Weeks of gestation of pregnancy not specified: Secondary | ICD-10-CM | POA: Insufficient documentation

## 2019-04-15 DIAGNOSIS — Z3490 Encounter for supervision of normal pregnancy, unspecified, unspecified trimester: Secondary | ICD-10-CM

## 2019-04-15 DIAGNOSIS — O26892 Other specified pregnancy related conditions, second trimester: Secondary | ICD-10-CM | POA: Insufficient documentation

## 2019-04-15 LAB — URINALYSIS, ROUTINE W REFLEX MICROSCOPIC
Bacteria, UA: NONE SEEN
Bilirubin Urine: NEGATIVE
Glucose, UA: NEGATIVE mg/dL
Hgb urine dipstick: NEGATIVE
Ketones, ur: NEGATIVE mg/dL
Nitrite: NEGATIVE
Protein, ur: NEGATIVE mg/dL
Specific Gravity, Urine: 1.008 (ref 1.005–1.030)
pH: 7 (ref 5.0–8.0)

## 2019-04-15 LAB — CBC WITH DIFFERENTIAL/PLATELET
Abs Immature Granulocytes: 0.07 10*3/uL (ref 0.00–0.07)
Basophils Absolute: 0 10*3/uL (ref 0.0–0.1)
Basophils Relative: 0 %
Eosinophils Absolute: 0.1 10*3/uL (ref 0.0–0.5)
Eosinophils Relative: 1 %
HCT: 39.8 % (ref 36.0–46.0)
Hemoglobin: 12.5 g/dL (ref 12.0–15.0)
Immature Granulocytes: 1 %
Lymphocytes Relative: 14 %
Lymphs Abs: 1.3 10*3/uL (ref 0.7–4.0)
MCH: 25.9 pg — ABNORMAL LOW (ref 26.0–34.0)
MCHC: 31.4 g/dL (ref 30.0–36.0)
MCV: 82.6 fL (ref 80.0–100.0)
Monocytes Absolute: 0.8 10*3/uL (ref 0.1–1.0)
Monocytes Relative: 8 %
Neutro Abs: 7 10*3/uL (ref 1.7–7.7)
Neutrophils Relative %: 76 %
Platelets: 163 10*3/uL (ref 150–400)
RBC: 4.82 MIL/uL (ref 3.87–5.11)
RDW: 13.8 % (ref 11.5–15.5)
WBC: 9.2 10*3/uL (ref 4.0–10.5)
nRBC: 0 % (ref 0.0–0.2)

## 2019-04-15 LAB — COMPREHENSIVE METABOLIC PANEL
ALT: 12 U/L (ref 0–44)
AST: 15 U/L (ref 15–41)
Albumin: 2.9 g/dL — ABNORMAL LOW (ref 3.5–5.0)
Alkaline Phosphatase: 61 U/L (ref 38–126)
Anion gap: 8 (ref 5–15)
BUN: 7 mg/dL (ref 6–20)
CO2: 19 mmol/L — ABNORMAL LOW (ref 22–32)
Calcium: 9.1 mg/dL (ref 8.9–10.3)
Chloride: 110 mmol/L (ref 98–111)
Creatinine, Ser: 0.49 mg/dL (ref 0.44–1.00)
GFR calc Af Amer: >60 mL/min (ref >60)
GFR calc non Af Amer: >60 mL/min (ref >60)
Glucose, Bld: 104 mg/dL — ABNORMAL HIGH (ref 70–99)
Potassium: 3.6 mmol/L (ref 3.5–5.1)
Sodium: 137 mmol/L (ref 135–145)
Total Bilirubin: 0.5 mg/dL (ref 0.3–1.2)
Total Protein: 6.2 g/dL — ABNORMAL LOW (ref 6.5–8.1)

## 2019-04-15 LAB — CBG MONITORING, ED: Glucose-Capillary: 91 mg/dL (ref 70–99)

## 2019-04-15 NOTE — ED Provider Notes (Signed)
[facility] EMERGENCY DEPARTMENT Provider Note   CSN: [id number] Arrival date & time: 04/15/19  1227     History Chief Complaint  Patient presents with  . Loss of Consciousness    Rhonda Fischer is a 29 y.o. female.  HPI Patient presents after syncopal episode.  She is around 6 months pregnant G3 P2.  States that she began to not feel good and felt lightheaded.  Went to go to bathroom and then passed out.  Did not injure anything.  Did not hit her abdomen.  Had not had breakfast or lunch today.  Feels better.  Had 500 cc of fluid by EMS.  No headache.  No confusion.  No chest pain.  No swelling in her legs.  Still feels baby move.  Randel Books is a girl.  No vaginal bleeding or discharge.    Past Medical History:  Diagnosis Date  . GERD (gastroesophageal reflux disease)     Patient Active Problem List   Diagnosis Date Noted  . Supervision of low-risk pregnancy 12/29/2018  . Language barrier 05/08/2012    Past Surgical History:  Procedure Laterality Date  . NO PAST SURGERIES       OB History    Gravida  3   Para  2   Term  2   Preterm  0   AB  0   Living  2     SAB  0   TAB  0   Ectopic  0   Multiple  0   Live Births  2           No family history on file.  Social History   Tobacco Use  . Smoking status: Never Smoker  . Smokeless tobacco: Never Used  Substance Use Topics  . Alcohol use: No  . Drug use: No    Home Medications Prior to Admission medications   Medication Sig Start Date End Date Taking? Authorizing Provider  famotidine (PEPCID) 20 MG tablet Take 1 tablet (20 mg total) by mouth 2 (two) times daily. 02/03/19  Yes Starr Lake, CNM  prenatal vitamin w/FE, FA (PRENATAL 1 + 1) 27-1 MG TABS tablet Take 1 tablet by mouth daily at 12 noon. 12/29/18 12/29/19 Yes Starr Lake, CNM  ibuprofen (ADVIL,MOTRIN) 600 MG tablet Take 1 tablet (600 mg total) by mouth every 6 (six) hours. Patient not  taking: Reported on 12/30/2018 02/20/15   Wouk, Ailene Rud, MD    Allergies    Patient has no known allergies.  Review of Systems   Review of Systems  Constitutional: Negative for appetite change.  HENT: Negative for congestion.   Respiratory: Negative for cough and shortness of breath.   Cardiovascular: Negative for chest pain.  Gastrointestinal: Negative for abdominal pain.  Genitourinary: Negative for enuresis, vaginal bleeding and vaginal discharge.  Musculoskeletal: Negative for back pain.  Skin: Negative for rash.  Neurological: Positive for syncope.  Psychiatric/Behavioral: Negative for confusion.    Physical Exam Updated Vital Signs BP 123/85 (BP Location: Right Arm)   Pulse 98   Temp 98.7 F (37.1 C) (Oral)   Resp 20   LMP 09/14/2018   SpO2 100%   Physical Exam Vitals and nursing note reviewed.  HENT:     Head: Normocephalic.  Eyes:     Pupils: Pupils are equal, round, and reactive to light.  Cardiovascular:     Rate and Rhythm: Normal rate and regular rhythm.  Pulmonary:     Breath sounds:  Normal breath sounds.  Abdominal:     Palpations: There is mass.     Comments: Gravid to above umbilicus.  No tenderness.  Musculoskeletal:     Cervical back: Neck supple.     Right lower leg: No edema.     Left lower leg: No edema.  Skin:    General: Skin is warm.     Capillary Refill: Capillary refill takes less than 2 seconds.  Neurological:     Mental Status: She is alert and oriented to person, place, and time.     ED Results / Procedures / Treatments   Labs (all labs ordered are listed, but only abnormal results are displayed) Labs Reviewed  COMPREHENSIVE METABOLIC PANEL - Abnormal; Notable for the following components:      Result Value   CO2 19 (*)    Glucose, Bld 104 (*)    Total Protein 6.2 (*)    Albumin 2.9 (*)    All other components within normal limits  CBC WITH DIFFERENTIAL/PLATELET - Abnormal; Notable for the following components:   MCH  25.9 (*)    All other components within normal limits  URINALYSIS, ROUTINE W REFLEX MICROSCOPIC - Abnormal; Notable for the following components:   Leukocytes,Ua SMALL (*)    All other components within normal limits  CBG MONITORING, ED    EKG EKG Interpretation  Date/Time:  Wednesday April 15 2019 13:58:15 EST Ventricular Rate:  94 PR Interval:    QRS Duration: 85 QT Interval:  351 QTC Calculation: 439 R Axis:   101 Text Interpretation: Sinus rhythm Probable left atrial enlargement Borderline right axis deviation Confirmed by Benjiman Core [phone]) on 04/15/2019 2:15:51 PM   Radiology No results found.  Procedures Procedures (including critical care time)  Medications Ordered in ED Medications - No data to display  ED Course  I have reviewed the triage vital signs and the nursing notes.  Pertinent labs & imaging results that were available during my care of the patient were reviewed by me and considered in my medical decision making (see chart for details).    MDM Rules/Calculators/A&P                      Patient with syncopal episode.  6 months pregnant.  Well-appearing.  Abdomen nontender.  Was seen by rapid response OB nurse. Final Clinical Impression(s) / ED Diagnoses Final diagnoses:  Syncope, unspecified syncope type  Pregnancy, unspecified gestational age    Rx / DC Orders ED Discharge Orders    None       Benjiman Core, MD 04/15/19 [phone]

## 2019-04-15 NOTE — Discharge Instructions (Addendum)
Try and keep yourself hydrated.  Follow-up with your doctor and your obstetrician.

## 2019-04-15 NOTE — ED Notes (Signed)
Called rr

## 2019-04-15 NOTE — ED Notes (Signed)
Pt ambulated self efficiently to restroom with no difficulty. Pt returned safely to bedside. 

## 2019-04-15 NOTE — Progress Notes (Addendum)
1241 Notified of this patient in ED. 1250 Arrived to evaluate this 29 yo G3P2 @ 28.[redacted] wks GA in with report of syncopal episode while at work. Patient reported not feeling good prior to syncopal episode in which she slid slowly down a wall and laid over to her side.  Reports unconscious for approximately 1 minute. Denies a fall or striking abdomen. Denies abdominal pain/ UC, vaginal LOF or bleeding and reports good FM.  Denies any problems with this pregnancy or other 2 pregnancies resulting in term vaginal deliveries.1320 FHT Category I, rare UC. FM palpated. Call placed to Dr. Ashok Pall whom will return call [facility] [phone] Dr. Ashok Pall notified of above. FHR remains Category I, no UC's noted currently. Per Dr. Ashok Pall pt can be OB cleared although he would recommend an EKG prior to discharge.

## 2019-04-15 NOTE — ED Triage Notes (Signed)
Pt from work with ems for syncopal episode, just prior to LOC pt c.o not feeling good and feeling lightheaded. Pt arrives alert, oriented. 6 months pregnant. Given fluids en route.  124/86 HR 105 CBG 75 100% room air 20G RH

## 2019-04-16 ENCOUNTER — Ambulatory Visit (INDEPENDENT_AMBULATORY_CARE_PROVIDER_SITE_OTHER): Payer: Self-pay | Admitting: Obstetrics & Gynecology

## 2019-04-16 VITALS — BP 114/72 | HR 98 | Wt 170.0 lb

## 2019-04-16 DIAGNOSIS — Z3493 Encounter for supervision of normal pregnancy, unspecified, third trimester: Secondary | ICD-10-CM

## 2019-04-16 DIAGNOSIS — Z3A28 28 weeks gestation of pregnancy: Secondary | ICD-10-CM

## 2019-04-16 DIAGNOSIS — Z789 Other specified health status: Secondary | ICD-10-CM

## 2019-04-16 MED ORDER — OMEPRAZOLE 20 MG PO CPDR: 20.0000 mg | DELAYED_RELEASE_CAPSULE | Freq: Every day | ORAL | 3 refills | Status: DC

## 2019-04-16 NOTE — Progress Notes (Signed)
   PRENATAL VISIT NOTE  Subjective:  Rhonda Fischer is a 29 y.o. G3P2002 at [redacted]w[redacted]d being seen today for ongoing prenatal care.  She is currently monitored for the following issues for this low-risk pregnancy and has Language barrier and Supervision of low-risk pregnancy on their problem list.  Patient reports She says that the prevacid is not helping her reflux. I will prescribe prilosec instead. She fell at work yesterday and would like to have a note so that she does not have to work anymore. I explained that I could not do this. She works at [employer]. She was seen at [facility] yesterday and given a clean bill of health..  Contractions: Not present. Vag. Bleeding: None.  Movement: Present. Denies leaking of fluid.   The following portions of the patient's history were reviewed and updated as appropriate: allergies, current medications, past family history, past medical history, past social history, past surgical history and problem list.   Objective:   Vitals:   04/16/19 1104  BP: 114/72  Pulse: 98  Weight: 170 lb (77.1 kg)    Fetal Status: Fetal Heart Rate (bpm): 150   Movement: Present     General:  Alert, oriented and cooperative. Patient is in no acute distress.  Skin: Skin is warm and dry. No rash noted.   Cardiovascular: Normal heart rate noted  Respiratory: Normal respiratory effort, no problems with respiration noted  Abdomen: Soft, gravid, appropriate for gestational age.  Pain/Pressure: Present     Pelvic: Cervical exam deferred        Extremities: Normal range of motion.  Edema: None  Mental Status: Normal mood and affect. Normal behavior. Normal judgment and thought content.   Assessment and Plan:  Pregnancy: G3P2002 at [redacted]w[redacted]d 1. Language barrier - she speaks [place] as well as [place] and declines an interpretor  2. Encounter for supervision of low-risk pregnancy in third trimester - [name] showed her how to use a BP cuff (She has one at home).  Preterm labor symptoms  and general obstetric precautions including but not limited to vaginal bleeding, contractions, leaking of fluid and fetal movement were reviewed in detail with the patient. Please refer to After Visit Summary for other counseling recommendations.   Return in about 3 weeks (around 05/07/2019) for virtual.  Future Appointments  Date Time Provider Department Center  05/07/2019  9:35 AM Rasch, Harolyn Rutherford, NP [facility]    Allie Bossier, MD

## 2019-04-28 ENCOUNTER — Inpatient Hospital Stay (HOSPITAL_COMMUNITY): Payer: No Typology Code available for payment source

## 2019-04-28 ENCOUNTER — Inpatient Hospital Stay (HOSPITAL_COMMUNITY)
Admission: AD | Admit: 2019-04-28 | Discharge: 2019-04-29 | Disposition: A | Discharge status: Home / Self Care | Payer: No Typology Code available for payment source | Attending: Family Medicine | Admitting: Family Medicine

## 2019-04-28 ENCOUNTER — Other Ambulatory Visit: Payer: Self-pay

## 2019-04-28 ENCOUNTER — Encounter (HOSPITAL_COMMUNITY): Payer: Self-pay | Admitting: Family Medicine

## 2019-04-28 DIAGNOSIS — Z3A30 30 weeks gestation of pregnancy: Secondary | ICD-10-CM | POA: Insufficient documentation

## 2019-04-28 DIAGNOSIS — O98513 Other viral diseases complicating pregnancy, third trimester: Secondary | ICD-10-CM | POA: Diagnosis not present

## 2019-04-28 DIAGNOSIS — R071 Chest pain on breathing: Secondary | ICD-10-CM | POA: Diagnosis not present

## 2019-04-28 DIAGNOSIS — O99513 Diseases of the respiratory system complicating pregnancy, third trimester: Secondary | ICD-10-CM | POA: Insufficient documentation

## 2019-04-28 DIAGNOSIS — Z20822 Contact with and (suspected) exposure to covid-19: Secondary | ICD-10-CM

## 2019-04-28 DIAGNOSIS — U071 COVID-19: Secondary | ICD-10-CM | POA: Insufficient documentation

## 2019-04-28 DIAGNOSIS — J189 Pneumonia, unspecified organism: Secondary | ICD-10-CM

## 2019-04-28 DIAGNOSIS — R109 Unspecified abdominal pain: Secondary | ICD-10-CM | POA: Diagnosis present

## 2019-04-28 DIAGNOSIS — N859 Noninflammatory disorder of uterus, unspecified: Secondary | ICD-10-CM

## 2019-04-28 DIAGNOSIS — O26893 Other specified pregnancy related conditions, third trimester: Secondary | ICD-10-CM | POA: Diagnosis not present

## 2019-04-28 DIAGNOSIS — J1282 Pneumonia due to coronavirus disease 2019: Secondary | ICD-10-CM | POA: Insufficient documentation

## 2019-04-28 DIAGNOSIS — N858 Other specified noninflammatory disorders of uterus: Secondary | ICD-10-CM

## 2019-04-28 LAB — URINALYSIS, ROUTINE W REFLEX MICROSCOPIC
Bilirubin Urine: NEGATIVE
Glucose, UA: 50 mg/dL — AB
Ketones, ur: 5 mg/dL — AB
Nitrite: NEGATIVE
Protein, ur: NEGATIVE mg/dL
Specific Gravity, Urine: 1.016 (ref 1.005–1.030)
pH: 6 (ref 5.0–8.0)

## 2019-04-28 LAB — COMPREHENSIVE METABOLIC PANEL
ALT: 28 U/L (ref 0–44)
AST: 34 U/L (ref 15–41)
Albumin: 2.6 g/dL — ABNORMAL LOW (ref 3.5–5.0)
Alkaline Phosphatase: 84 U/L (ref 38–126)
Anion gap: 10 (ref 5–15)
BUN: 5 mg/dL — ABNORMAL LOW (ref 6–20)
CO2: 18 mmol/L — ABNORMAL LOW (ref 22–32)
Calcium: 8.3 mg/dL — ABNORMAL LOW (ref 8.9–10.3)
Chloride: 106 mmol/L (ref 98–111)
Creatinine, Ser: 0.6 mg/dL (ref 0.44–1.00)
GFR calc Af Amer: >60 mL/min (ref >60)
GFR calc non Af Amer: >60 mL/min (ref >60)
Glucose, Bld: 99 mg/dL (ref 70–99)
Potassium: 3.7 mmol/L (ref 3.5–5.1)
Sodium: 134 mmol/L — ABNORMAL LOW (ref 135–145)
Total Bilirubin: 0.6 mg/dL (ref 0.3–1.2)
Total Protein: 6.1 g/dL — ABNORMAL LOW (ref 6.5–8.1)

## 2019-04-28 LAB — CBC WITH DIFFERENTIAL/PLATELET
Abs Immature Granulocytes: 0.04 10*3/uL (ref 0.00–0.07)
Basophils Absolute: 0 10*3/uL (ref 0.0–0.1)
Basophils Relative: 0 %
Eosinophils Absolute: 0 10*3/uL (ref 0.0–0.5)
Eosinophils Relative: 0 %
HCT: 38 % (ref 36.0–46.0)
Hemoglobin: 12.4 g/dL (ref 12.0–15.0)
Immature Granulocytes: 1 %
Lymphocytes Relative: 16 %
Lymphs Abs: 0.9 10*3/uL (ref 0.7–4.0)
MCH: 26.4 pg (ref 26.0–34.0)
MCHC: 32.6 g/dL (ref 30.0–36.0)
MCV: 80.9 fL (ref 80.0–100.0)
Monocytes Absolute: 0.5 10*3/uL (ref 0.1–1.0)
Monocytes Relative: 9 %
Neutro Abs: 4.2 10*3/uL (ref 1.7–7.7)
Neutrophils Relative %: 74 %
Platelets: 154 10*3/uL (ref 150–400)
RBC: 4.7 MIL/uL (ref 3.87–5.11)
RDW: 13.6 % (ref 11.5–15.5)
WBC: 5.7 10*3/uL (ref 4.0–10.5)
nRBC: 0 % (ref 0.0–0.2)

## 2019-04-28 LAB — BRAIN NATRIURETIC PEPTIDE: B Natriuretic Peptide: 18.3 pg/mL (ref 0.0–100.0)

## 2019-04-28 LAB — D-DIMER, QUANTITATIVE: D-Dimer, Quant: 1.82 ug/mL-FEU — ABNORMAL HIGH (ref 0.00–0.50)

## 2019-04-28 LAB — FETAL FIBRONECTIN: Fetal Fibronectin: NEGATIVE

## 2019-04-28 LAB — TROPONIN I (HIGH SENSITIVITY): Troponin I (High Sensitivity): 7 ng/L (ref <18)

## 2019-04-28 MED ORDER — ACETAMINOPHEN 325 MG PO TABS
650.0000 mg | ORAL_TABLET | Freq: Once | ORAL | Status: AC
  Administered 2019-04-28: 650 mg via ORAL
  Filled 2019-04-28: qty 2

## 2019-04-28 MED ORDER — SODIUM CHLORIDE 0.9 % IV SOLN: INTRAVENOUS | Status: DC

## 2019-04-28 MED ORDER — IOHEXOL 350 MG/ML SOLN
75.0000 mL | Freq: Once | INTRAVENOUS | Status: AC | PRN
  Administered 2019-04-29: 75 mL via INTRAVENOUS

## 2019-04-28 MED ORDER — LIDOCAINE VISCOUS HCL 2 % MT SOLN
15.0000 mL | Freq: Once | OROMUCOSAL | Status: AC
  Administered 2019-04-28: 22:00:00 15 mL via ORAL
  Filled 2019-04-28: qty 15

## 2019-04-28 MED ORDER — ALUM & MAG HYDROXIDE-SIMETH 200-200-20 MG/5ML PO SUSP
30.0000 mL | Freq: Once | ORAL | Status: AC
  Administered 2019-04-28: 30 mL via ORAL
  Filled 2019-04-28: qty 30

## 2019-04-28 NOTE — [facility] Note (Signed)
Transport here to take patient to CT.

## 2019-04-28 NOTE — MAU Provider Note (Signed)
Chief Complaint:  Fall and Abdominal Pain   First Provider Initiated Contact with Patient 04/28/19 2042     HPI: Rhonda Fischer is a 29 y.o. G3P2002 at 64w3dwho presents to maternity admissions reporting pain in her chest just to left of sternum for one week.  Hurts only when she breathes deeply.  Denies shortness of breath or congestion/cough.  Denies exposure to Covid.  States chest pain got worse in the past two days.  . She reports good fetal movement, denies LOF, vaginal bleeding, vaginal itching/burning, urinary symptoms, h/a, dizziness, n/v, diarrhea, constipation or fever/chills.  .  Fall The accident occurred more than 1 week ago. The fall occurred in unknown circumstances. There was no blood loss. Pain location: right side of abdomen, now resolved. The patient is experiencing no pain. Associated symptoms include abdominal pain and a fever. Pertinent negatives include no headaches, nausea or vomiting.  Abdominal Pain This is a recurrent problem. The current episode started in the past 7 days. The onset quality is gradual. The problem occurs intermittently. Pain location: right side of abdomen. The pain is mild. The quality of the pain is cramping (only occasional). The abdominal pain does not radiate. Associated symptoms include a fever. Pertinent negatives include no constipation, diarrhea, dysuria, frequency, headaches, myalgias, nausea or vomiting. Nothing aggravates the pain. The pain is relieved by nothing.  Other This is a new problem. The current episode started in the past 7 days. The problem occurs constantly. The problem has been unchanged. Associated symptoms include abdominal pain, chest pain (mostly when breathing deeply) and a fever. Pertinent negatives include no chills, congestion, coughing, headaches, myalgias, nausea, sore throat or vomiting. She has tried nothing for the symptoms.    RN Note: Pt here with complaints of chest pain with inspiration that started one week  ago. She denies SOB, cough, or fever. Denies cardiac history. She reports right sided pain that hurts when she moves. Larey Seat one week ago. No vaginal bleeding or LOF. Good fetal movement. Has been taking Tylenol without relief. Last took at 11am today. She speaks [place], but decline interpretor.   Past Medical History: Past Medical History:  Diagnosis Date  . GERD (gastroesophageal reflux disease)     Past obstetric history: OB History  Gravida Para Term Preterm AB Living  3 2 2  0 0 2  SAB TAB Ectopic Multiple Live Births  0 0 0 0 2    # Outcome Date GA Lbr Len/2nd Weight Sex Delivery Anes PTL Lv  3 Current           2 Term 02/18/15 [redacted]w[redacted]d 13:28 / 00:04 3345 g M Vag-Spont None  LIV  1 Term 10/23/12 [redacted]w[redacted]d 08:13 / 01:50 2935 g F Vag-Spont EPI  LIV    Past Surgical History: Past Surgical History:  Procedure Laterality Date  . NO PAST SURGERIES      Family History: History reviewed. No pertinent family history.  Social History: Social History   Tobacco Use  . Smoking status: Never Smoker  . Smokeless tobacco: Never Used  Substance Use Topics  . Alcohol use: No  . Drug use: No    Allergies: No Known Allergies  Meds:  Medications Prior to Admission  Medication Sig Dispense Refill Last Dose  . acetaminophen (TYLENOL) 500 MG tablet Take 1,000 mg by mouth every 6 (six) hours as needed.   04/28/2019 at Unknown time  . ibuprofen (ADVIL) 200 MG tablet Take 200 mg by mouth every 6 (six) hours as needed.  04/28/2019 at Unknown time  . omeprazole (PRILOSEC) 20 MG capsule Take 1 capsule (20 mg total) by mouth daily. 30 capsule 3 Unknown at Unknown time  . prenatal vitamin w/FE, FA (PRENATAL 1 + 1) 27-1 MG TABS tablet Take 1 tablet by mouth daily at 12 noon. (Patient not taking: Reported on 04/16/2019) 30 tablet 11     I have reviewed patient's Past Medical Hx, Surgical Hx, Family Hx, Social Hx, medications and allergies.   ROS:  Review of Systems  Constitutional: Positive for fever.  Negative for chills.  HENT: Negative for congestion and sore throat.   Respiratory: Negative for cough.   Cardiovascular: Positive for chest pain (mostly when breathing deeply).  Gastrointestinal: Positive for abdominal pain. Negative for constipation, diarrhea, nausea and vomiting.  Genitourinary: Negative for dysuria and frequency.  Musculoskeletal: Negative for myalgias.  Neurological: Negative for headaches.   Other systems negative  Physical Exam   Patient Vitals for the past 24 hrs:  BP Temp Temp src Pulse Resp SpO2  04/28/19 1946 102/66 100.1 F (37.8 C) Oral (!) 113 (!) 21 99 %   Constitutional: Well-developed, well-nourished female in no acute distress.  Cardiovascular: normal rate and rhythm Respiratory: normal effort, clear to auscultation bilaterally GI: Abd soft, non-tender, gravid appropriate for gestational age.   No rebound or guarding. MS: Extremities nontender, no edema, normal ROM Neurologic: Alert and oriented x 4.  GU: Neg CVAT.  PELVIC EXAM:  Dilation: Fingertip Effacement (%): 30 Cervical Position: Middle Station: -3 Exam by:: Jimmye Norman CNM Fetal fibronectin sent  >> Negative  FHT:  Baseline 170 , moderate variability, accelerations present, no decelerations Contractions: Uterine irritability   Labs: B/Positive/-- (10/13 1531) Results for orders placed or performed during the hospital encounter of 04/28/19 (from the past 24 hour(s))  Urinalysis, Routine w reflex microscopic     Status: Abnormal   Collection Time: 04/28/19  7:51 PM  Result Value Ref Range   Color, Urine YELLOW YELLOW   APPearance CLEAR CLEAR   Specific Gravity, Urine 1.016 1.005 - 1.030   pH 6.0 5.0 - 8.0   Glucose, UA 50 (A) NEGATIVE mg/dL   Hgb urine dipstick SMALL (A) NEGATIVE   Bilirubin Urine NEGATIVE NEGATIVE   Ketones, ur 5 (A) NEGATIVE mg/dL   Protein, ur NEGATIVE NEGATIVE mg/dL   Nitrite NEGATIVE NEGATIVE   Leukocytes,Ua SMALL (A) NEGATIVE   RBC / HPF 0-5 0 - 5  RBC/hpf   WBC, UA 6-10 0 - 5 WBC/hpf   Bacteria, UA RARE (A) NONE SEEN   Squamous Epithelial / LPF 6-10 0 - 5   Mucus PRESENT   Fetal fibronectin     Status: None   Collection Time: 04/28/19  8:31 PM  Result Value Ref Range   Fetal Fibronectin NEGATIVE NEGATIVE  CBC with Differential/Platelet     Status: None   Collection Time: 04/28/19  9:02 PM  Result Value Ref Range   WBC 5.7 4.0 - 10.5 K/uL   RBC 4.70 3.87 - 5.11 MIL/uL   Hemoglobin 12.4 12.0 - 15.0 g/dL   HCT 38.0 36.0 - 46.0 %   MCV 80.9 80.0 - 100.0 fL   MCH 26.4 26.0 - 34.0 pg   MCHC 32.6 30.0 - 36.0 g/dL   RDW 13.6 11.5 - 15.5 %   Platelets 154 150 - 400 K/uL   nRBC 0.0 0.0 - 0.2 %   Neutrophils Relative % 74 %   Neutro Abs 4.2 1.7 - 7.7 K/uL   Lymphocytes  Relative 16 %   Lymphs Abs 0.9 0.7 - 4.0 K/uL   Monocytes Relative 9 %   Monocytes Absolute 0.5 0.1 - 1.0 K/uL   Eosinophils Relative 0 %   Eosinophils Absolute 0.0 0.0 - 0.5 K/uL   Basophils Relative 0 %   Basophils Absolute 0.0 0.0 - 0.1 K/uL   Immature Granulocytes 1 %   Abs Immature Granulocytes 0.04 0.00 - 0.07 K/uL  Comprehensive metabolic panel     Status: Abnormal   Collection Time: 04/28/19  9:02 PM  Result Value Ref Range   Sodium 134 (L) 135 - 145 mmol/L   Potassium 3.7 3.5 - 5.1 mmol/L   Chloride 106 98 - 111 mmol/L   CO2 18 (L) 22 - 32 mmol/L   Glucose, Bld 99 70 - 99 mg/dL   BUN 5 (L) 6 - 20 mg/dL   Creatinine, Ser 2.68 0.44 - 1.00 mg/dL   Calcium 8.3 (L) 8.9 - 10.3 mg/dL   Total Protein 6.1 (L) 6.5 - 8.1 g/dL   Albumin 2.6 (L) 3.5 - 5.0 g/dL   AST 34 15 - 41 U/L   ALT 28 0 - 44 U/L   Alkaline Phosphatase 84 38 - 126 U/L   Total Bilirubin 0.6 0.3 - 1.2 mg/dL   GFR calc non Af Amer >60 >60 mL/min   GFR calc Af Amer >60 >60 mL/min   Anion gap 10 5 - 15  Troponin I (High Sensitivity)     Status: None   Collection Time: 04/28/19  9:02 PM  Result Value Ref Range   Troponin I (High Sensitivity) 7 <18 ng/L  Brain natriuretic peptide      Status: None   Collection Time: 04/28/19  9:02 PM  Result Value Ref Range   B Natriuretic Peptide 18.3 0.0 - 100.0 pg/mL  D-dimer, quantitative (not at [facility])     Status: Abnormal   Collection Time: 04/28/19  9:52 PM  Result Value Ref Range   D-Dimer, Quant 1.82 (H) 0.00 - 0.50 ug/mL-FEU    Imaging:  CT Angio Chest PE W and/or Wo Contrast  Result Date: 04/29/2019 CLINICAL DATA:  Chest pain, pregnancy hypoxia EXAM: CT ANGIOGRAPHY CHEST WITH CONTRAST TECHNIQUE: Multidetector CT imaging of the chest was performed using the standard protocol during bolus administration of intravenous contrast. Multiplanar CT image reconstructions and MIPs were obtained to evaluate the vascular anatomy. CONTRAST:  45mL OMNIPAQUE IOHEXOL 350 MG/ML SOLN COMPARISON:  None. FINDINGS: Cardiovascular: Slightly suboptimal opacification of the main pulmonary artery. No central or segmental pulmonary embolism. Limited visualization of the distal subsegmental branches. The heart is normal in size. No pericardial effusion or thickening. No evidence right heart strain. There is normal three-vessel brachiocephalic anatomy without proximal stenosis. The thoracic aorta is normal in appearance. Mediastinum/Nodes: No hilar, mediastinal, or axillary adenopathy. Thyroid gland, trachea, and esophagus demonstrate no significant findings. Lungs/Pleura: Multifocal patchy ground-glass opacities are seen predominantly at the periphery throughout both lungs. No air bronchograms are seen. No pneumothorax or pleural effusion. Upper Abdomen: No acute abnormalities present in the visualized portions of the upper abdomen. Musculoskeletal: No chest wall abnormality. No acute or significant osseous findings. Review of the MIP images confirms the above findings. IMPRESSION: Slightly suboptimal opacification of the main pulmonary artery. No central or segmental pulmonary embolism. Multifocal patchy/ground-glass opacity seen throughout both lungs, consistent  with multifocal pneumonia. Electronically Signed   By: Jonna Clark M.D.   On: 04/29/2019 00:33   DG Chest Portable 1 View  Result  Date: 04/28/2019 CLINICAL DATA:  Chest pain. Pregnant patient in third trimester pregnancy. EXAM: PORTABLE CHEST 1 VIEW COMPARISON:  None. FINDINGS: Low lung volumes. Mild patchy opacities in the mid lower lung zones. Upper normal heart size likely accentuated by technique. No pleural fluid or pneumothorax. Patient's chin partially obscures the apices. IMPRESSION: Low lung volumes. Patchy opacities in the mid lower lung zones may be atelectasis or pneumonia. Electronically Signed   By: Narda Rutherford M.D.   On: 04/28/2019 20:59    MAU Course/MDM: I have ordered labs and reviewed results.  NST reviewed, reactive but tachycardic, likely due to fever Had some irregular contractions so FFn sent >> negative Dilation: Fingertip Effacement (%): 30 Cervical Position: Middle Station: -3 Exam by:: Mahayla Haddaway CNM Contractions improved with IV hydration.  Consult Dr Adrian Blackwater with presentation, exam findings and test results.  Consult Dr Daphine Deutscher with cardiology.  EKG normal but does have features of Possible PE.  Treatments in MAU included IV hydration, Tylenol, GI cocktail.    Dr Adrian Blackwater recommended getting a D-Dimer, if elevated would recommend CT Angiogram >>  Ordered CTA revealed no PE but bilateral ground-glass pneumonia.  Per Dr Adrian Blackwater, OK to discharge home with strict return precautions  Discussed with patient.  Start quarantine at home for her and her household members Entered into home monitoring program Pt has MyChart and agrees to home program Advised to go to ED if she develops shortness of breath or dyspnea  Assessment: Single IUP at [redacted]w[redacted]d Chest pain with inspiration Bilateral ground-glass pneumonia, likely Covid Uterine irritability with negative Fetal Fibronectin  Plan: Discharge home Covid self-care info given Recommend quarantine for now Signed  up for home monitoring protocol Preterm Labor precautions and fetal kick counts Follow up in Office for prenatal visits via video for now  Encouraged to return here or to other Urgent Care/ED if she develops worsening of symptoms, increase in pain, fever, or other concerning symptoms.   Pt stable at time of discharge.  Wynelle Bourgeois CNM, MSN Certified Nurse-Midwife 04/28/2019 9:06 PM

## 2019-04-28 NOTE — MAU Note (Signed)
Pt reports to MAU c/o Right sided pain that hurts when she moves. Pt reports a fall 1 week ago. Pt denies bleeding. +FM. No LOF. Pt reports pain in her chest no cough or SOB. No cardiac hx. Pt reports she took tylenol and had no relief.

## 2019-04-28 NOTE — MAU Note (Cosign Needed Addendum)
Pt here with complaints of chest pain with inspiration that started one week ago. She denies SOB, cough, or fever. Denies cardiac history. She reports right sided pain that hurts when she moves. Larey Seat one week ago. No vaginal bleeding or LOF. Good fetal movement. Has been taking Tylenol without relief. Last took at 11am today. She speaks [place], but decline interpretor.

## 2019-04-29 ENCOUNTER — Telehealth (HOSPITAL_COMMUNITY): Payer: Self-pay

## 2019-04-29 ENCOUNTER — Encounter: Payer: Self-pay | Admitting: Advanced Practice Midwife

## 2019-04-29 DIAGNOSIS — O26893 Other specified pregnancy related conditions, third trimester: Secondary | ICD-10-CM

## 2019-04-29 DIAGNOSIS — R071 Chest pain on breathing: Secondary | ICD-10-CM

## 2019-04-29 DIAGNOSIS — U071 COVID-19: Secondary | ICD-10-CM

## 2019-04-29 DIAGNOSIS — Z3A30 30 weeks gestation of pregnancy: Secondary | ICD-10-CM

## 2019-04-29 DIAGNOSIS — J1282 Pneumonia due to coronavirus disease 2019: Secondary | ICD-10-CM

## 2019-04-29 HISTORY — DX: COVID-19: U07.1

## 2019-04-29 LAB — SARS CORONAVIRUS 2 (TAT 6-24 HRS): SARS Coronavirus 2: POSITIVE — AB

## 2019-04-29 MED ORDER — MUCINEX 600 MG PO TB12: 600.0000 mg | ORAL_TABLET | Freq: Two times a day (BID) | ORAL | 2 refills | Status: DC

## 2019-04-29 NOTE — Discharge Instructions (Signed)
Community-Acquired Pneumonia, Adult °Pneumonia is a type of lung infection that causes swelling in the airways of the lungs. Mucus and fluid may also build up inside the airways. This may cause coughing and difficulty breathing. °There are different types of pneumonia. One type can develop while a person is in a hospital. A different type is called community-acquired pneumonia. It develops in people who are not, and have not recently been, in the hospital or another type of health care facility. °What are the causes? °This condition may be caused by: °· Viruses. This is the most common cause of pneumonia. °· Bacteria. Community-acquired pneumonia is often caused by Streptococcus pneumoniae bacteria. These bacteria are often passed from one person to another by breathing in droplets from the cough or sneeze of an infected person. °· Fungi. This is the least common cause of pneumonia. °What increases the risk? °The following factors may make you more likely to develop this condition: °· Having a chronic disease, such as chronic obstructive pulmonary disease (COPD), asthma, congestive heart failure, cystic fibrosis, diabetes, or kidney disease. °· Having early-stage or late-stage HIV. °· Having sickle cell disease. °· Having had your spleen removed (splenectomy). °· Having poor dental hygiene. °· Having a medical condition that increases the risk of breathing in (aspirating) secretions from your own mouth and nose. °· Having a weakened body defense system (immune system). °· Being a smoker. °· Traveling to areas where pneumonia-causing germs commonly exist. °· Being around animal habitats or animals that have pneumonia-causing germs, including birds, bats, rabbits, cats, and farm animals. °What are the signs or symptoms? °Symptoms of this condition include: °· A dry cough. °· A wet (productive) cough. °· Fever. °· Sweating. °· Chest pain, especially when breathing deeply or coughing. °· Rapid breathing or difficulty  breathing. °· Shortness of breath. °· Shaking chills. °· Fatigue. °· Muscle aches. °How is this diagnosed? °This condition may be diagnosed based on: °· Your medical history. °· A physical exam. °You may also have tests, including: °· Chest X-rays. °· Tests of your blood oxygen level and other blood gases. °· Tests on blood, mucus (sputum), fluid around your lungs (pleural fluid), and urine. °If your pneumonia is severe, other tests may be done to find the exact cause of your illness. °How is this treated? °Treatment for this condition depends on many factors, such as the cause of your pneumonia, the medicines you take, and other medical conditions that you have. °For most adults, treatment and recovery from pneumonia may occur at home. In some cases, treatment must happen in a hospital. Treatment may include: °· Medicines that are given by mouth or through an IV, including: °? Antibiotic medicines, if the pneumonia was caused by bacteria. °? Antiviral medicines, if the pneumonia was caused by a virus. °· Being given extra oxygen. °· Respiratory therapy. °Although rare, treating severe pneumonia may include: °· Using a machine to help you breathe (mechanical ventilation). This is done if you are not breathing well on your own and you cannot maintain a safe blood oxygen level. °· Thoracentesis. This is a procedure to remove fluid from around one lung or both lungs to help you breathe better. °Follow these instructions at home: ° °Medicines °· Take over-the-counter and prescription medicines only as told by your health care provider. °? Only take cough medicine if you are losing sleep. Be aware that cough medicine can prevent your body's natural ability to remove mucus from your lungs. °· If you were prescribed an antibiotic   medicine, take it as told by your health care provider. Do not stop taking the antibiotic even if you start to feel better. General instructions  Sleep in a semi-upright position at night. Try  sleeping in a reclining chair, or place a few pillows under your head.  Rest as needed and get at least 8 hours of sleep each night.  Drink enough water to keep your urine pale yellow. This will help to thin out mucus secretions in your lungs.  Eat a healthy diet that includes plenty of vegetables, fruits, whole grains, low-fat dairy products, and lean protein.  Do not use any products that contain nicotine or tobacco, such as cigarettes, e-cigarettes, and chewing tobacco. If you need help quitting, ask your health care provider.  Keep all follow-up visits as told by your health care provider. This is important. How is this prevented? You can lower your risk of developing community-acquired pneumonia by:  Getting a pneumococcal vaccine. There are different types and schedules of pneumococcal vaccines. Ask your health care provider which option is best for you. Consider getting the vaccine if: ? You are older than 31 years of age. ? You are older than 31 years of age and are undergoing cancer treatment, have chronic lung disease, or have other medical conditions that affect your immune system. Ask your health care provider if this applies to you.  Getting an influenza vaccine every year. Ask your health care provider which type of vaccine is best for you.  Getting regular checkups from your dentist.  Washing your hands often. If soap and water are not available, use hand sanitizer. Contact a health care provider if:  You have a fever.  You are losing sleep because you cannot control your cough with cough medicine. Get help right away if:  You have worsening shortness of breath.  You have increased chest pain.  Your sickness becomes worse, especially if you are an older adult or have a weakened immune system.  You cough up blood. Summary  Pneumonia is an infection of the lungs.  Community-acquired pneumonia develops in people who have not been in the hospital. It can be caused  by bacteria, viruses, or fungi.  This condition may be treated with antibiotics or antiviral medicines.  Severe cases may require hospitalization, mechanical ventilation, and other procedures to drain fluid from the lungs. This information is not intended to replace advice given to you by your health care provider. Make sure you discuss any questions you have with your health care provider. Document Revised: 10/31/2017 Document Reviewed: 10/31/2017 Elsevier Patient Education  2020 [employer]  COVID-19 COVID-19 is a respiratory infection that is caused by a virus called severe acute respiratory syndrome coronavirus 2 (SARS-CoV-2). The disease is also known as coronavirus disease or novel coronavirus. In some people, the virus may not cause any symptoms. In others, it may cause a serious infection. The infection can get worse quickly and can lead to complications, such as:  Pneumonia, or infection of the lungs.  Acute respiratory distress syndrome or ARDS. This is a condition in which fluid build-up in the lungs prevents the lungs from filling with air and passing oxygen into the blood.  Acute respiratory failure. This is a condition in which there is not enough oxygen passing from the lungs to the body or when carbon dioxide is not passing from the lungs out of the body.  Sepsis or septic shock. This is a serious bodily reaction to an infection.  Blood clotting  problems.  Secondary infections due to bacteria or fungus.  Organ failure. This is when your body's organs stop working. The virus that causes COVID-19 is contagious. This means that it can spread from person to person through droplets from coughs and sneezes (respiratory secretions). What are the causes? This illness is caused by a virus. You may catch the virus by:  Breathing in droplets from an infected person. Droplets can be spread by a person breathing, speaking, singing, coughing, or sneezing.  Touching something, like  a table or a doorknob, that was exposed to the virus (contaminated) and then touching your mouth, nose, or eyes. What increases the risk? Risk for infection You are more likely to be infected with this virus if you:  Are within 6 feet (2 meters) of a person with COVID-19.  Provide care for or live with a person who is infected with COVID-19.  Spend time in crowded indoor spaces or live in shared housing. Risk for serious illness You are more likely to become seriously ill from the virus if you:  Are 15 years of age or older. The higher your age, the more you are at risk for serious illness.  Live in a nursing home or long-term care facility.  Have cancer.  Have a long-term (chronic) disease such as: ? Chronic lung disease, including chronic obstructive pulmonary disease or asthma. ? A long-term disease that lowers your body's ability to fight infection (immunocompromised). ? Heart disease, including heart failure, a condition in which the arteries that lead to the heart become narrow or blocked (coronary artery disease), a disease which makes the heart muscle thick, weak, or stiff (cardiomyopathy). ? Diabetes. ? Chronic kidney disease. ? Sickle cell disease, a condition in which red blood cells have an abnormal "sickle" shape. ? Liver disease.  Are obese. What are the signs or symptoms? Symptoms of this condition can range from mild to severe. Symptoms may appear any time from 2 to 14 days after being exposed to the virus. They include:  A fever or chills.  A cough.  Difficulty breathing.  Headaches, body aches, or muscle aches.  Runny or stuffy (congested) nose.  A sore throat.  New loss of taste or smell. Some people may also have stomach problems, such as nausea, vomiting, or diarrhea. Other people may not have any symptoms of COVID-19. How is this diagnosed? This condition may be diagnosed based on:  Your signs and symptoms, especially if: ? You live in an area  with a COVID-19 outbreak. ? You recently traveled to or from an area where the virus is common. ? You provide care for or live with a person who was diagnosed with COVID-19. ? You were exposed to a person who was diagnosed with COVID-19.  A physical exam.  Lab tests, which may include: ? Taking a sample of fluid from the back of your nose and throat (nasopharyngeal fluid), your nose, or your throat using a swab. ? A sample of mucus from your lungs (sputum). ? Blood tests.  Imaging tests, which may include, X-rays, CT scan, or ultrasound. How is this treated? At present, there is no medicine to treat COVID-19. Medicines that treat other diseases are being used on a trial basis to see if they are effective against COVID-19. Your health care provider will talk with you about ways to treat your symptoms. For most people, the infection is mild and can be managed at home with rest, fluids, and over-the-counter medicines. Treatment for a serious  infection usually takes places in a hospital intensive care unit (ICU). It may include one or more of the following treatments. These treatments are given until your symptoms improve.  Receiving fluids and medicines through an IV.  Supplemental oxygen. Extra oxygen is given through a tube in the nose, a face mask, or a hood.  Positioning you to lie on your stomach (prone position). This makes it easier for oxygen to get into the lungs.  Continuous positive airway pressure (CPAP) or bi-level positive airway pressure (BPAP) machine. This treatment uses mild air pressure to keep the airways open. A tube that is connected to a motor delivers oxygen to the body.  Ventilator. This treatment moves air into and out of the lungs by using a tube that is placed in your windpipe.  Tracheostomy. This is a procedure to create a hole in the neck so that a breathing tube can be inserted.  Extracorporeal membrane oxygenation (ECMO). This procedure gives the lungs a  chance to recover by taking over the functions of the heart and lungs. It supplies oxygen to the body and removes carbon dioxide. Follow these instructions at home: Lifestyle  If you are sick, stay home except to get medical care. Your health care provider will tell you how long to stay home. Call your health care provider before you go for medical care.  Rest at home as told by your health care provider.  Do not use any products that contain nicotine or tobacco, such as cigarettes, e-cigarettes, and chewing tobacco. If you need help quitting, ask your health care provider.  Return to your normal activities as told by your health care provider. Ask your health care provider what activities are safe for you. General instructions  Take over-the-counter and prescription medicines only as told by your health care provider.  Drink enough fluid to keep your urine pale yellow.  Keep all follow-up visits as told by your health care provider. This is important. How is this prevented?  There is no vaccine to help prevent COVID-19 infection. However, there are steps you can take to protect yourself and others from this virus. To protect yourself:   Do not travel to areas where COVID-19 is a risk. The areas where COVID-19 is reported change often. To identify high-risk areas and travel restrictions, check the CDC travel website: FatFares.com.br  If you live in, or must travel to, an area where COVID-19 is a risk, take precautions to avoid infection. ? Stay away from people who are sick. ? Wash your hands often with soap and water for 20 seconds. If soap and water are not available, use an alcohol-based hand sanitizer. ? Avoid touching your mouth, face, eyes, or nose. ? Avoid going out in public, follow guidance from your state and local health authorities. ? If you must go out in public, wear a cloth face covering or face mask. Make sure your mask covers your nose and mouth. ? Avoid  crowded indoor spaces. Stay at least 6 feet (2 meters) away from others. ? Disinfect objects and surfaces that are frequently touched every day. This may include:  Counters and tables.  Doorknobs and light switches.  Sinks and faucets.  Electronics, such as phones, remote controls, keyboards, computers, and tablets. To protect others: If you have symptoms of COVID-19, take steps to prevent the virus from spreading to others.  If you think you have a COVID-19 infection, contact your health care provider right away. Tell your health care team that you  think you may have a COVID-19 infection.  Stay home. Leave your house only to seek medical care. Do not use public transport.  Do not travel while you are sick.  Wash your hands often with soap and water for 20 seconds. If soap and water are not available, use alcohol-based hand sanitizer.  Stay away from other members of your household. Let healthy household members care for children and pets, if possible. If you have to care for children or pets, wash your hands often and wear a mask. If possible, stay in your own room, separate from others. Use a different bathroom.  Make sure that all people in your household wash their hands well and often.  Cough or sneeze into a tissue or your sleeve or elbow. Do not cough or sneeze into your hand or into the air.  Wear a cloth face covering or face mask. Make sure your mask covers your nose and mouth. Where to find more information  Centers for Disease Control and Prevention: StickerEmporium.tn  World Health Organization: https://thompson-craig.com/ Contact a health care provider if:  You live in or have traveled to an area where COVID-19 is a risk and you have symptoms of the infection.  You have had contact with someone who has COVID-19 and you have symptoms of the infection. Get help right away if:  You have trouble breathing.  You have pain or  pressure in your chest.  You have confusion.  You have bluish lips and fingernails.  You have difficulty waking from sleep.  You have symptoms that get worse. These symptoms may represent a serious problem that is an emergency. Do not wait to see if the symptoms will go away. Get medical help right away. Call your local emergency services (911 in the [place] Do not drive yourself to the hospital. Let the emergency medical personnel know if you think you have COVID-19. Summary  COVID-19 is a respiratory infection that is caused by a virus. It is also known as coronavirus disease or novel coronavirus. It can cause serious infections, such as pneumonia, acute respiratory distress syndrome, acute respiratory failure, or sepsis.  The virus that causes COVID-19 is contagious. This means that it can spread from person to person through droplets from breathing, speaking, singing, coughing, or sneezing.  You are more likely to develop a serious illness if you are 22 years of age or older, have a weak immune system, live in a nursing home, or have chronic disease.  There is no medicine to treat COVID-19. Your health care provider will talk with you about ways to treat your symptoms.  Take steps to protect yourself and others from infection. Wash your hands often and disinfect objects and surfaces that are frequently touched every day. Stay away from people who are sick and wear a mask if you are sick. This information is not intended to replace advice given to you by your health care provider. Make sure you discuss any questions you have with your health care provider. Document Revised: 01/02/2019 Document Reviewed: 04/10/2018 Elsevier Patient Education  2020 [employer].

## 2019-04-29 NOTE — Telephone Encounter (Signed)
Spoke with pt. Concerning her Covid -19 results.  She was aware of results and we discussed to stay home for 10 days and to be 24 hrs free of fever without fever-reducing medications and symptoms are improving before returning to work/community.  Pt. Reports feeling fine.  Discussed to return to the ED for any chest pain, difficulty breathing, resp. Distress.  Keep in contact with OB/Gyn about Covid results.  Inform anyone you have had contact with. All questions answered .  Pt. Verbalized understanding.

## 2019-05-07 ENCOUNTER — Telehealth: Payer: Self-pay | Admitting: Obstetrics and Gynecology

## 2019-05-10 ENCOUNTER — Encounter (INDEPENDENT_AMBULATORY_CARE_PROVIDER_SITE_OTHER): Payer: Self-pay

## 2019-05-14 ENCOUNTER — Other Ambulatory Visit: Payer: Self-pay

## 2019-05-14 ENCOUNTER — Encounter: Payer: Self-pay | Admitting: Advanced Practice Midwife

## 2019-05-14 ENCOUNTER — Telehealth (INDEPENDENT_AMBULATORY_CARE_PROVIDER_SITE_OTHER): Payer: No Typology Code available for payment source | Admitting: Advanced Practice Midwife

## 2019-05-14 DIAGNOSIS — U071 COVID-19: Secondary | ICD-10-CM

## 2019-05-14 DIAGNOSIS — J1282 Pneumonia due to coronavirus disease 2019: Secondary | ICD-10-CM

## 2019-05-14 DIAGNOSIS — Z3493 Encounter for supervision of normal pregnancy, unspecified, third trimester: Secondary | ICD-10-CM

## 2019-05-14 DIAGNOSIS — Z3A32 32 weeks gestation of pregnancy: Secondary | ICD-10-CM

## 2019-05-14 DIAGNOSIS — O98513 Other viral diseases complicating pregnancy, third trimester: Secondary | ICD-10-CM

## 2019-05-14 NOTE — Progress Notes (Signed)
   TELEHEALTH VIRTUAL OBSTETRICS VISIT ENCOUNTER NOTE  I connected with Andria Rhein on 05/14/19 at  3:35 PM EST by telephone at home and verified that I am speaking with the correct person using two identifiers.   I discussed the limitations, risks, security and privacy concerns of performing an evaluation and management service by telephone and the availability of in person appointments. I also discussed with the patient that there may be a patient responsible charge related to this service. The patient expressed understanding and agreed to proceed.  Subjective:  Rhonda Fischer is a 29 y.o. G3P2002 at 108w5d being followed for ongoing prenatal care.  She is currently monitored for the following issues for this low-risk pregnancy and has Language barrier; Supervision of low-risk pregnancy; and Pneumonia due to COVID-19 virus on their problem list.  Patient reports no complaints. Reports fetal movement. Denies any contractions, bleeding or leaking of fluid.   The following portions of the patient's history were reviewed and updated as appropriate: allergies, current medications, past family history, past medical history, past social history, past surgical history and problem list.   Objective:   General:  Alert, oriented and cooperative.   Mental Status: Normal mood and affect perceived. Normal judgment and thought content.  Rest of physical exam deferred due to type of encounter  Assessment and Plan:  Pregnancy: G3P2002 at [redacted]w[redacted]d 1. Pneumonia due to COVID-19 virus - feels mostly recovered from covid. No complaints today   2. Encounter for supervision of low-risk pregnancy in third trimester - routine care  Preterm labor symptoms and general obstetric precautions including but not limited to vaginal bleeding, contractions, leaking of fluid and fetal movement were reviewed in detail with the patient.  I discussed the assessment and treatment plan with the patient. The patient was  provided an opportunity to ask questions and all were answered. The patient agreed with the plan and demonstrated an understanding of the instructions. The patient was advised to call back or seek an in-person office evaluation/go to [facility] for any urgent or concerning symptoms. Please refer to After Visit Summary for other counseling recommendations.   I provided 10 minutes of non-face-to-face time during this encounter.  Return in about 2 weeks (around 05/28/2019) for virtual visit .  No future appointments.  Thressa Sheller DNP, CNM  05/14/19  3:56 PM  [facility] [employer], [facility]

## 2019-05-14 NOTE — Progress Notes (Signed)
I connected with  Rhonda Fischer on 05/14/19 at  3:35 PM EST by telephone and verified that I am speaking with the correct person using two identifiers.   I discussed the limitations, risks, security and privacy concerns of performing an evaluation and management service by telephone and the availability of in person appointments. I also discussed with the patient that there may be a patient responsible charge related to this service. The patient expressed understanding and agreed to proceed.  Ernestina Patches, CMA 05/14/2019  3:42 PM

## 2019-06-02 ENCOUNTER — Telehealth (INDEPENDENT_AMBULATORY_CARE_PROVIDER_SITE_OTHER): Payer: No Typology Code available for payment source | Admitting: Medical

## 2019-06-02 ENCOUNTER — Encounter: Payer: Self-pay | Admitting: Medical

## 2019-06-02 DIAGNOSIS — U071 COVID-19: Secondary | ICD-10-CM

## 2019-06-02 DIAGNOSIS — O98513 Other viral diseases complicating pregnancy, third trimester: Secondary | ICD-10-CM | POA: Diagnosis not present

## 2019-06-02 DIAGNOSIS — J1282 Pneumonia due to coronavirus disease 2019: Secondary | ICD-10-CM | POA: Diagnosis not present

## 2019-06-02 DIAGNOSIS — Z3A35 35 weeks gestation of pregnancy: Secondary | ICD-10-CM | POA: Diagnosis not present

## 2019-06-02 DIAGNOSIS — Z3493 Encounter for supervision of normal pregnancy, unspecified, third trimester: Secondary | ICD-10-CM

## 2019-06-02 NOTE — Progress Notes (Signed)
I connected with  Rhonda Fischer on 06/02/19 at  3:55 PM EDT by telephone and verified that I am speaking with the correct person using two identifiers.   I discussed the limitations, risks, security and privacy concerns of performing an evaluation and management service by telephone and the availability of in person appointments. I also discussed with the patient that there may be a patient responsible charge related to this service. The patient expressed understanding and agreed to proceed.  [name], CMA 06/02/2019  3:59 PM

## 2019-06-02 NOTE — Progress Notes (Signed)
   TELEHEALTH OBSTETRICS VISIT ENCOUNTER NOTE  I connected with Rhonda Fischer on 06/02/19 at  3:55 PM EDT by telephone at home and verified that I am speaking with the correct person using two identifiers.   I discussed the limitations, risks, security and privacy concerns of performing an evaluation and management service by telephone and the availability of in person appointments. I also discussed with the patient that there may be a patient responsible charge related to this service. The patient expressed understanding and agreed to proceed.  Subjective:  Rhonda Fischer is a 29 y.o. G3P2002 at [redacted]w[redacted]d being followed for ongoing prenatal care.  She is currently monitored for the following issues for this low-risk pregnancy and has Language barrier; Supervision of low-risk pregnancy; and Pneumonia due to COVID-19 virus on their problem list.  Patient reports no complaints. Reports fetal movement. Denies any contractions, bleeding or leaking of fluid.   The following portions of the patient's history were reviewed and updated as appropriate: allergies, current medications, past family history, past medical history, past social history, past surgical history and problem list.   Objective:   General:  Alert, oriented and cooperative.   Mental Status: Normal mood and affect perceived. Normal judgment and thought content.  Rest of physical exam deferred due to type of encounter  Assessment and Plan:  Pregnancy: G3P2002 at [redacted]w[redacted]d 1. Pneumonia due to COVID-19 virus - COVID 04/28/19  2. Encounter for supervision of low-risk pregnancy in third trimester - Doing well, no complaints  - Advised of need for in-person visit next week for GBS and GC/Chalmydia   Preterm labor symptoms and general obstetric precautions including but not limited to vaginal bleeding, contractions, leaking of fluid and fetal movement were reviewed in detail with the patient.  I discussed the assessment and treatment plan  with the patient. The patient was provided an opportunity to ask questions and all were answered. The patient agreed with the plan and demonstrated an understanding of the instructions. The patient was advised to call back or seek an in-person office evaluation/go to MAU at [facility] for any urgent or concerning symptoms. Please refer to After Visit Summary for other counseling recommendations.   I provided 10 minutes of non-face-to-face time during this encounter.  Return in about 1 week (around 06/09/2019) for LOB, In-Person.  No future appointments.  Vonzella Nipple, PA-C [facility] [employer], [facility]

## 2019-06-02 NOTE — Progress Notes (Signed)
Patient states currently is not home to take her Blood Pressure, asked if she could take when she gets home & then record in [facility], patient verbalized understanding.

## 2019-06-02 NOTE — Patient Instructions (Signed)
Fetal Movement Counts Patient Name: ________________________________________________ Patient Due Date: ____________________ What is a fetal movement count?  A fetal movement count is the number of times that you feel your baby move during a certain amount of time. This may also be called a fetal kick count. A fetal movement count is recommended for every pregnant woman. You may be asked to start counting fetal movements as early as week 28 of your pregnancy. Pay attention to when your baby is most active. You may notice your baby's sleep and wake cycles. You may also notice things that make your baby move more. You should do a fetal movement count:  When your baby is normally most active.  At the same time each day. A good time to count movements is while you are resting, after having something to eat and drink. How do I count fetal movements? 1. Find a quiet, comfortable area. Sit, or lie down on your side. 2. Write down the date, the start time and stop time, and the number of movements that you felt between those two times. Take this information with you to your health care visits. 3. Write down your start time when you feel the first movement. 4. Count kicks, flutters, swishes, rolls, and jabs. You should feel at least 10 movements. 5. You may stop counting after you have felt 10 movements, or if you have been counting for 2 hours. Write down the stop time. 6. If you do not feel 10 movements in 2 hours, contact your health care provider for further instructions. Your health care provider may want to do additional tests to assess your baby's well-being. Contact a health care provider if:  You feel fewer than 10 movements in 2 hours.  Your baby is not moving like he or she usually does. Date: ____________ Start time: ____________ Stop time: ____________ Movements: ____________ Date: ____________ Start time: ____________ Stop time: ____________ Movements: ____________ Date: ____________  Start time: ____________ Stop time: ____________ Movements: ____________ Date: ____________ Start time: ____________ Stop time: ____________ Movements: ____________ Date: ____________ Start time: ____________ Stop time: ____________ Movements: ____________ Date: ____________ Start time: ____________ Stop time: ____________ Movements: ____________ Date: ____________ Start time: ____________ Stop time: ____________ Movements: ____________ Date: ____________ Start time: ____________ Stop time: ____________ Movements: ____________ Date: ____________ Start time: ____________ Stop time: ____________ Movements: ____________ This information is not intended to replace advice given to you by your health care provider. Make sure you discuss any questions you have with your health care provider. Document Revised: 10/23/2018 Document Reviewed: 10/23/2018 Elsevier Patient Education  2020 [employer] Braxton Hicks Contractions Contractions of the uterus can occur throughout pregnancy, but they are not always a sign that you are in labor. You may have practice contractions called Braxton Hicks contractions. These false labor contractions are sometimes confused with true labor. What are Braxton Hicks contractions? Braxton Hicks contractions are tightening movements that occur in the muscles of the uterus before labor. Unlike true labor contractions, these contractions do not result in opening (dilation) and thinning of the cervix. Toward the end of pregnancy (32-34 weeks), Braxton Hicks contractions can happen more often and may become stronger. These contractions are sometimes difficult to tell apart from true labor because they can be very uncomfortable. You should not feel embarrassed if you go to the hospital with false labor. Sometimes, the only way to tell if you are in true labor is for your health care provider to look for changes in the cervix. The health care provider   will do a physical exam and may  monitor your contractions. If you are not in true labor, the exam should show that your cervix is not dilating and your water has not broken. If there are no other health problems associated with your pregnancy, it is completely safe for you to be sent home with false labor. You may continue to have Braxton Hicks contractions until you go into true labor. How to tell the difference between true labor and false labor True labor  Contractions last 30-70 seconds.  Contractions become very regular.  Discomfort is usually felt in the top of the uterus, and it spreads to the lower abdomen and low back.  Contractions do not go away with walking.  Contractions usually become more intense and increase in frequency.  The cervix dilates and gets thinner. False labor  Contractions are usually shorter and not as strong as true labor contractions.  Contractions are usually irregular.  Contractions are often felt in the front of the lower abdomen and in the groin.  Contractions may go away when you walk around or change positions while lying down.  Contractions get weaker and are shorter-lasting as time goes on.  The cervix usually does not dilate or become thin. Follow these instructions at home:   Take over-the-counter and prescription medicines only as told by your health care provider.  Keep up with your usual exercises and follow other instructions from your health care provider.  Eat and drink lightly if you think you are going into labor.  If Braxton Hicks contractions are making you uncomfortable: ? Change your position from lying down or resting to walking, or change from walking to resting. ? Sit and rest in a tub of warm water. ? Drink enough fluid to keep your urine pale yellow. Dehydration may cause these contractions. ? Do slow and deep breathing several times an hour.  Keep all follow-up prenatal visits as told by your health care provider. This is important. Contact a  health care provider if:  You have a fever.  You have continuous pain in your abdomen. Get help right away if:  Your contractions become stronger, more regular, and closer together.  You have fluid leaking or gushing from your vagina.  You pass blood-tinged mucus (bloody show).  You have bleeding from your vagina.  You have low back pain that you never had before.  You feel your baby's head pushing down and causing pelvic pressure.  Your baby is not moving inside you as much as it used to. Summary  Contractions that occur before labor are called Braxton Hicks contractions, false labor, or practice contractions.  Braxton Hicks contractions are usually shorter, weaker, farther apart, and less regular than true labor contractions. True labor contractions usually become progressively stronger and regular, and they become more frequent.  Manage discomfort from Braxton Hicks contractions by changing position, resting in a warm bath, drinking plenty of water, or practicing deep breathing. This information is not intended to replace advice given to you by your health care provider. Make sure you discuss any questions you have with your health care provider. Document Revised: 02/15/2017 Document Reviewed: 07/19/2016 Elsevier Patient Education  2020 Elsevier Inc.  

## 2019-06-09 ENCOUNTER — Ambulatory Visit (INDEPENDENT_AMBULATORY_CARE_PROVIDER_SITE_OTHER): Payer: No Typology Code available for payment source | Admitting: Advanced Practice Midwife

## 2019-06-09 ENCOUNTER — Other Ambulatory Visit: Payer: Self-pay

## 2019-06-09 ENCOUNTER — Encounter: Payer: Self-pay | Admitting: Advanced Practice Midwife

## 2019-06-09 VITALS — BP 112/76 | HR 99 | Wt 173.2 lb

## 2019-06-09 DIAGNOSIS — Z113 Encounter for screening for infections with a predominantly sexual mode of transmission: Secondary | ICD-10-CM

## 2019-06-09 DIAGNOSIS — Z3493 Encounter for supervision of normal pregnancy, unspecified, third trimester: Secondary | ICD-10-CM

## 2019-06-09 NOTE — Progress Notes (Signed)
   PRENATAL VISIT NOTE  Subjective:  Rhonda Fischer is a 29 y.o. G3P2002 at [redacted]w[redacted]d being seen today for ongoing prenatal care.  She is currently monitored for the following issues for this low-risk pregnancy and has Language barrier; Supervision of low-risk pregnancy; and Pneumonia due to COVID-19 virus on their problem list.  Patient reports no complaints.  Contractions: Not present. Vag. Bleeding: None.  Movement: Present. Denies leaking of fluid.   The following portions of the patient's history were reviewed and updated as appropriate: allergies, current medications, past family history, past medical history, past social history, past surgical history and problem list.   Objective:   Vitals:   06/09/19 1505  BP: 112/76  Pulse: 99  Weight: 173 lb 3.2 oz (78.6 kg)    Fetal Status: Fetal Heart Rate (bpm): 162   Movement: Present     General:  Alert, oriented and cooperative. Patient is in no acute distress.  Skin: Skin is warm and dry. No rash noted.   Cardiovascular: Normal heart rate noted  Respiratory: Normal respiratory effort, no problems with respiration noted  Abdomen: Soft, gravid, appropriate for gestational age.  Pain/Pressure: Present     Pelvic: Cervical exam performed in the presence of a chaperone        Extremities: Normal range of motion.  Edema: None  Mental Status: Normal mood and affect. Normal behavior. Normal judgment and thought content.   Assessment and Plan:  Pregnancy: G3P2002 at [redacted]w[redacted]d 1. Encounter for supervision of low-risk pregnancy in third trimester - GC/Chlamydia probe amp ([place])not at [facility] - Culture, beta strep (group b only)  Term labor symptoms and general obstetric precautions including but not limited to vaginal bleeding, contractions, leaking of fluid and fetal movement were reviewed in detail with the patient. Please refer to After Visit Summary for other counseling recommendations.   Return in about 1 week (around 06/16/2019) for  virtual visit .  No future appointments.  Thressa Sheller DNP, CNM  06/09/19  3:21 PM

## 2019-06-10 LAB — GC/CHLAMYDIA PROBE AMP (~~LOC~~) NOT AT ARMC
Chlamydia: NEGATIVE
Comment: NEGATIVE
Comment: NORMAL
Neisseria Gonorrhea: NEGATIVE

## 2019-06-12 LAB — CULTURE, BETA STREP (GROUP B ONLY): Strep Gp B Culture: POSITIVE — AB

## 2019-06-15 DIAGNOSIS — B951 Streptococcus, group B, as the cause of diseases classified elsewhere: Secondary | ICD-10-CM | POA: Insufficient documentation

## 2019-06-15 NOTE — Progress Notes (Signed)
I connected with Rhonda Fischer on 06/16/19 at  1:35 PM EDT by: MyChart and verified that I am speaking with the correct person using two identifiers.  Patient is located at home and provider is located at [facility].     The purpose of this virtual visit is to provide medical care while limiting exposure to the novel coronavirus. I discussed the limitations, risks, security and privacy concerns of performing an evaluation and management service by MyChart and the availability of in person appointments. I also discussed with the patient that there may be a patient responsible charge related to this service. By engaging in this virtual visit, you consent to the provision of healthcare.  Additionally, you authorize for your insurance to be billed for the services provided during this visit.  The patient expressed understanding and agreed to proceed.  The following staff members participated in the virtual visit:  Donia Ast, NP    PRENATAL VISIT NOTE  Subjective:  Rhonda Fischer is a 29 y.o. [id number] at [redacted]w[redacted]d  for phone visit for ongoing prenatal care.  She is currently monitored for the following issues for this low-risk pregnancy and has Language barrier; Supervision of low-risk pregnancy; Pneumonia due to COVID-19 virus; and Group beta Strep positive on their problem list.  Patient reports no complaints.  Contractions: Not present. Vag. Bleeding: None.  Movement: Present. Denies leaking of fluid.   The following portions of the patient's history were reviewed and updated as appropriate: allergies, current medications, past family history, past medical history, past social history, past surgical history and problem list.   Objective:  There were no vitals filed for this visit. Pt does not have BP cuff at this time, but will take BP later and call or message with results.  Fetal Status:     Movement: Present     Assessment and Plan:  Pregnancy: G3P2002 at [redacted]w[redacted]d  1. Encounter for  supervision of low-risk pregnancy in third trimester -no concerns -discussed induction at 41 weeks -discussed GBS+ status and antibiotics in labor  2. Language barrier -pt declines interpreter today  I discussed the assessment and treatment plan with the patient. The patient was provided an opportunity to ask questions and all were answered. The patient agreed with the plan and demonstrated an understanding of the instructions. The patient was advised to call back or seek an in-person office evaluation/go to MAU at [facility] for any urgent or concerning symptoms.  Term labor symptoms and general obstetric precautions including but not limited to vaginal bleeding, contractions, leaking of fluid and fetal movement were reviewed in detail with the patient.  Return in about 1 week (around 06/23/2019) for virtual LOB.  Future Appointments  Date Time Provider Department Center  06/16/2019  1:35 PM Tuff Clabo, Odie Sera, NP [facility]  06/23/2019  3:15 PM Gerrit Heck, CNM [facility]  06/30/2019  1:15 PM Raelyn Mora, CNM [facility]     Time spent on virtual visit: 6 minutes  Marylen Ponto, NP

## 2019-06-16 ENCOUNTER — Telehealth (INDEPENDENT_AMBULATORY_CARE_PROVIDER_SITE_OTHER): Payer: No Typology Code available for payment source | Admitting: Women's Health

## 2019-06-16 ENCOUNTER — Other Ambulatory Visit: Payer: Self-pay

## 2019-06-16 ENCOUNTER — Encounter: Payer: Self-pay | Admitting: Women's Health

## 2019-06-16 DIAGNOSIS — J1282 Pneumonia due to coronavirus disease 2019: Secondary | ICD-10-CM

## 2019-06-16 DIAGNOSIS — Z3493 Encounter for supervision of normal pregnancy, unspecified, third trimester: Secondary | ICD-10-CM

## 2019-06-16 DIAGNOSIS — B951 Streptococcus, group B, as the cause of diseases classified elsewhere: Secondary | ICD-10-CM

## 2019-06-16 DIAGNOSIS — Z789 Other specified health status: Secondary | ICD-10-CM

## 2019-06-16 DIAGNOSIS — U071 COVID-19: Secondary | ICD-10-CM

## 2019-06-16 NOTE — Patient Instructions (Signed)
Maternity Assessment Unit (MAU)  The Maternity Assessment Unit (MAU) is located at the [facility] at [facility]. The address is: [address], [place], [place], [place] [address]. Please see map below for additional directions.    The Maternity Assessment Unit is designed to help you during your pregnancy, and for up to 6 weeks after delivery, with any pregnancy- or postpartum-related emergencies, if you think you are in labor, or if your water has broken. For example, if you experience nausea and vomiting, vaginal bleeding, severe abdominal or pelvic pain, elevated blood pressure or other problems related to your pregnancy or postpartum time, please come to the Maternity Assessment Unit for assistance.    Group B Streptococcus Infection During Pregnancy Group B Streptococcus (GBS) is a type of bacteria that is often found in healthy people. It is commonly found in the rectum, vagina, and intestines. In people who are healthy and not pregnant, the bacteria rarely cause serious illness or complications. However, women who test positive for GBS during pregnancy can pass the bacteria to the baby during childbirth. This can cause serious infection in the baby after birth. Women with GBS may also have infections during their pregnancy or soon after childbirth. The infections include urinary tract infections (UTIs) or infections of the uterus. GBS also increases a woman's risk of complications during pregnancy, such as early labor or delivery, miscarriage, or stillbirth. Routine testing for GBS is recommended for all pregnant women. What are the causes? This condition is caused by bacteria called Streptococcus agalactiae. What increases the risk? You may have a higher risk for GBS infection during pregnancy if you had one during a past pregnancy. What are the signs or symptoms? In most cases, GBS infection does not cause symptoms in pregnant women. If symptoms exist,  they may include:  Labor that starts before the 37th week of pregnancy.  A UTI or bladder infection. This may cause a fever, frequent urination, or pain and burning during urination.  Fever during labor. There can also be a rapid heartbeat in the mother or baby. Rare but serious symptoms of a GBS infection in women include:  Blood infection (septicemia). This may cause fever, chills, or confusion.  Lung infection (pneumonia). This may cause fever, chills, cough, rapid breathing, chest pain, or difficulty breathing.  Bone, joint, skin, or soft tissue infection. How is this diagnosed? You may be screened for GBS between week 35 and week 37 of pregnancy. If you have symptoms of preterm labor, you may be screened earlier. This condition is diagnosed based on lab test results from:  A swab of fluid from the vagina and rectum.  A urine sample. How is this treated? This condition is treated with antibiotic medicine. Antibiotic medicine may be given:  To you when you go into labor, or as soon as your water breaks. The medicines will continue until after you give birth. If you are having a cesarean delivery, you do not need antibiotics unless your water has broken.  To your baby, if he or she requires treatment. Your health care provider will check your baby to decide if he or she needs antibiotics to prevent a serious infection. Follow these instructions at home:  Take over-the-counter and prescription medicines only as told by your health care provider.  Take your antibiotic medicine as told by your health care provider. Do not stop taking the antibiotic even if you start to feel better.  Keep all pre-birth (prenatal) visits and  follow-up visits as told by your health care provider. This is important. Contact a health care provider if:  You have pain or burning when you urinate.  You have to urinate more often than usual.  You have a fever or chills.  You develop a bad-smelling  vaginal discharge. Get help right away if:  Your water breaks.  You go into labor.  You have severe pain in your abdomen.  You have difficulty breathing.  You have chest pain. These symptoms may represent a serious problem that is an emergency. Do not wait to see if the symptoms will go away. Get medical help right away. Call your local emergency services (911 in the [place] Do not drive yourself to the hospital. Summary  GBS is a type of bacteria that is common in healthy people.  During pregnancy, colonization with GBS can cause serious complications for you or your baby.  Your health care provider will screen you between 35 and 37 weeks of pregnancy to determine if you are colonized with GBS.  If you are colonized with GBS during pregnancy, your health care provider will recommend antibiotics through an IV during labor.  After delivery, your baby will be evaluated for complications related to potential GBS infection and may require antibiotics to prevent a serious infection. This information is not intended to replace advice given to you by your health care provider. Make sure you discuss any questions you have with your health care provider. Document Revised: 09/29/2018 Document Reviewed: 09/29/2018 Elsevier Patient Education  2020 [employer].    Signs and Symptoms of Labor Labor is your body's natural process of moving your baby, placenta, and umbilical cord out of your uterus. The process of labor usually starts when your baby is full-term, between 36 and 40 weeks of pregnancy. How will I know when I am close to going into labor? As your body prepares for labor and the birth of your baby, you may notice the following symptoms in the weeks and days before true labor starts:  Having a strong desire to get your home ready to receive your new baby. This is called nesting. Nesting may be a sign that labor is approaching, and it may occur several weeks before birth. Nesting may  involve cleaning and organizing your home.  Passing a small amount of thick, bloody mucus out of your vagina (normal bloody show or losing your mucus plug). This may happen more than a week before labor begins, or it might occur right before labor begins as the opening of the cervix starts to widen (dilate). For some women, the entire mucus plug passes at once. For others, smaller portions of the mucus plug may gradually pass over several days.  Your baby moving (dropping) lower in your pelvis to get into position for birth (lightening). When this happens, you may feel more pressure on your bladder and pelvic bone and less pressure on your ribs. This may make it easier to breathe. It may also cause you to need to urinate more often and have problems with bowel movements.  Having "practice contractions" (Braxton Hicks contractions) that occur at irregular (unevenly spaced) intervals that are more than 10 minutes apart. This is also called false labor. False labor contractions are common after exercise or sexual activity, and they will stop if you change position, rest, or drink fluids. These contractions are usually mild and do not get stronger over time. They may feel like: ? A backache or back pain. ? Mild cramps,  similar to menstrual cramps. ? Tightening or pressure in your abdomen. Other early symptoms that labor may be starting soon include:  Nausea or loss of appetite.  Diarrhea.  Having a sudden burst of energy, or feeling very tired.  Mood changes.  Having trouble sleeping. How will I know when labor has begun? Signs that true labor has begun may include:  Having contractions that come at regular (evenly spaced) intervals and increase in intensity. This may feel like more intense tightening or pressure in your abdomen that moves to your back. ? Contractions may also feel like rhythmic pain in your upper thighs or back that comes and goes at regular intervals. ? For first-time  mothers, this change in intensity of contractions often occurs at a more gradual pace. ? Women who have given birth before may notice a more rapid progression of contraction changes.  Having a feeling of pressure in the vaginal area.  Your water breaking (rupture of membranes). This is when the sac of fluid that surrounds your baby breaks. When this happens, you will notice fluid leaking from your vagina. This may be clear or blood-tinged. Labor usually starts within 24 hours of your water breaking, but it may take longer to begin. ? Some women notice this as a gush of fluid. ? Others notice that their underwear repeatedly becomes damp. Follow these instructions at home:   When labor starts, or if your water breaks, call your health care provider or nurse care line. Based on your situation, they will determine when you should go in for an exam.  When you are in early labor, you may be able to rest and manage symptoms at home. Some strategies to try at home include: ? Breathing and relaxation techniques. ? Taking a warm bath or shower. ? Listening to music. ? Using a heating pad on the lower back for pain. If you are directed to use heat:  Place a towel between your skin and the heat source.  Leave the heat on for 20-30 minutes.  Remove the heat if your skin turns bright red. This is especially important if you are unable to feel pain, heat, or cold. You may have a greater risk of getting burned. Get help right away if:  You have painful, regular contractions that are 5 minutes apart or less.  Labor starts before you are [redacted] weeks along in your pregnancy.  You have a fever.  You have a headache that does not go away.  You have bright red blood coming from your vagina.  You do not feel your baby moving.  You have a sudden onset of: ? Severe headache with vision problems. ? Nausea, vomiting, or diarrhea. ? Chest pain or shortness of breath. These symptoms may be an emergency. If  your health care provider recommends that you go to the hospital or birth center where you plan to deliver, do not drive yourself. Have someone else drive you, or call emergency services (911 in the [place] Summary  Labor is your body's natural process of moving your baby, placenta, and umbilical cord out of your uterus.  The process of labor usually starts when your baby is full-term, between 46 and 40 weeks of pregnancy.  When labor starts, or if your water breaks, call your health care provider or nurse care line. Based on your situation, they will determine when you should go in for an exam. This information is not intended to replace advice given to you by your health care  provider. Make sure you discuss any questions you have with your health care provider. Document Revised: 12/03/2016 Document Reviewed: 08/10/2016 Elsevier Patient Education  2020 [employer].

## 2019-06-18 ENCOUNTER — Encounter: Payer: Self-pay | Admitting: Obstetrics and Gynecology

## 2019-06-23 ENCOUNTER — Telehealth (INDEPENDENT_AMBULATORY_CARE_PROVIDER_SITE_OTHER): Payer: No Typology Code available for payment source

## 2019-06-23 DIAGNOSIS — Z3493 Encounter for supervision of normal pregnancy, unspecified, third trimester: Secondary | ICD-10-CM

## 2019-06-23 DIAGNOSIS — Z3A38 38 weeks gestation of pregnancy: Secondary | ICD-10-CM

## 2019-06-23 DIAGNOSIS — O99820 Streptococcus B carrier state complicating pregnancy: Secondary | ICD-10-CM

## 2019-06-23 DIAGNOSIS — B951 Streptococcus, group B, as the cause of diseases classified elsewhere: Secondary | ICD-10-CM

## 2019-06-23 NOTE — Progress Notes (Signed)
   Induction Assessment Scheduling Form: Fax to [facility]:  [phone]  Rhonda Fischer                                                                                   DOB:  10-May-1990                                                            MRN:  [id number]                                                                     Phone #:   [phone]                         Provider:  Ninfa Meeker  GP:  [id number]                                                            Estimated Date of Delivery: 07/04/19  Dating Criteria: 17 week [place]    Medical Indications for induction:  PostDates Admission Date/Time:  April 24th at 0700 Gestational age on admission:  41 weeks   There were no vitals filed for this visit. HIV:  Non Reactive (01/12 0853) GBS: Positive/-- (03/23 1523)  Bishop score TBD   Method of induction(proposed):  Cytotec/TBD   Scheduling Provider Signature:  Cherre Robins, CNM                                            Today's Date:  06/23/2019

## 2019-06-23 NOTE — Addendum Note (Signed)
Addended by: Gerrit Heck L on: 06/23/2019 04:49 PM   Modules accepted: Orders, SmartSet

## 2019-06-23 NOTE — Progress Notes (Signed)
   OBSTETRICS PRENATAL VIRTUAL VISIT ENCOUNTER NOTE  Provider location: [facility] [place]   I connected with Rhonda Fischer on 06/23/19 at  3:15 PM EDT by MyChart Video Encounter at home and verified that I am speaking with the correct person using two identifiers.   I discussed the limitations, risks, security and privacy concerns of performing an evaluation and management service virtually and the availability of in person appointments. I also discussed with the patient that there may be a patient responsible charge related to this service. The patient expressed understanding and agreed to proceed. Subjective:  Rhonda Fischer is a 29 y.o. G3P2002 at [redacted]w[redacted]d being seen today for ongoing prenatal care.  She is currently monitored for the following issues for this low-risk pregnancy and has Language barrier; Supervision of low-risk pregnancy; Pneumonia due to COVID-19 virus; and Group beta Strep positive on their problem list.  Patient reports no complaints.  Contractions: Not present. Vag. Bleeding: None.  Movement: Present. Patient denies vaginal concerns including leaking of fluid, discharge, or odor. However, patient reports some vaginal itching that started yesterday and has had none today. She denies recent sexual activity.   The following portions of the patient's history were reviewed and updated as appropriate: allergies, current medications, past family history, past medical history, past social history, past surgical history and problem list.   Objective:   Vitals:   06/23/19 1523  BP: 110/77  Pulse: (!) 101    Fetal Status:     Movement: Present     General:  Alert, oriented and cooperative. Patient is in no acute distress.  Respiratory: Normal respiratory effort, no problems with respiration noted  Mental Status: Normal mood and affect. Normal behavior. Normal judgment and thought content.  Rest of physical exam deferred due to type of  encounter  Imaging: No results found.  Assessment and Plan:  Pregnancy: G3P2002 at [redacted]w[redacted]d 1. Group beta Strep positive -Reviewed need for treatment in labor.  2. Encounter for supervision of low-risk pregnancy in third trimester -Instructed to monitor vaginal itching and report any worsening of condition including onset of odor or vaginal discharge.  -Anticipatory guidance for upcoming appts. -Reviewed anticipated IOL for April 24th at 0700 or when called by staff. -IOL form completed and request submitted.   Term labor symptoms and general obstetric precautions including but not limited to vaginal bleeding, contractions, leaking of fluid and fetal movement were reviewed in detail with the patient. I discussed the assessment and treatment plan with the patient. The patient was provided an opportunity to ask questions and all were answered. The patient agreed with the plan and demonstrated an understanding of the instructions. The patient was advised to call back or seek an in-person office evaluation/go to MAU at [facility] for any urgent or concerning symptoms. Please refer to After Visit Summary for other counseling recommendations.   I provided 7 minutes of face-to-face time during this encounter.  No follow-ups on file.  Future Appointments  Date Time Provider Department Center  06/30/2019  1:15 PM Raelyn Mora, CNM [facility]    Cherre Robins, CNM Center for [employer], [facility]

## 2019-06-23 NOTE — Progress Notes (Signed)
Called pt with [facility] interpreter Remi Deter ID [phone]. Pt states she will not need an interpreter for this appt. Call discontinued so that visit may continue without interpreter. Andria Rhein on 06/23/19 at  3:15 PM EDT by telephone and verified that I am speaking with the correct person using two identifiers.   I discussed the limitations, risks, security and privacy concerns of performing an evaluation and management service by telephone and the availability of in person appointments. I also discussed with the patient that there may be a patient responsible charge related to this service. The patient expressed understanding and agreed to proceed.  Pt reports some vaginal itching; denies any abnormal discharge or odor.    Marjo Bicker, RN 06/23/2019  3:21 PM

## 2019-06-30 ENCOUNTER — Telehealth (HOSPITAL_COMMUNITY): Payer: Self-pay | Admitting: *Deleted

## 2019-06-30 ENCOUNTER — Other Ambulatory Visit: Payer: Self-pay

## 2019-06-30 ENCOUNTER — Encounter (HOSPITAL_COMMUNITY): Payer: Self-pay | Admitting: *Deleted

## 2019-06-30 ENCOUNTER — Telehealth (INDEPENDENT_AMBULATORY_CARE_PROVIDER_SITE_OTHER): Payer: No Typology Code available for payment source | Admitting: Obstetrics and Gynecology

## 2019-06-30 DIAGNOSIS — U071 COVID-19: Secondary | ICD-10-CM

## 2019-06-30 DIAGNOSIS — Z3A39 39 weeks gestation of pregnancy: Secondary | ICD-10-CM

## 2019-06-30 DIAGNOSIS — Z3493 Encounter for supervision of normal pregnancy, unspecified, third trimester: Secondary | ICD-10-CM

## 2019-06-30 DIAGNOSIS — J1282 Pneumonia due to coronavirus disease 2019: Secondary | ICD-10-CM

## 2019-06-30 DIAGNOSIS — O98813 Other maternal infectious and parasitic diseases complicating pregnancy, third trimester: Secondary | ICD-10-CM

## 2019-06-30 DIAGNOSIS — B951 Streptococcus, group B, as the cause of diseases classified elsewhere: Secondary | ICD-10-CM

## 2019-06-30 DIAGNOSIS — O99513 Diseases of the respiratory system complicating pregnancy, third trimester: Secondary | ICD-10-CM

## 2019-06-30 NOTE — Telephone Encounter (Signed)
Preadmission screen  

## 2019-06-30 NOTE — Progress Notes (Signed)
   MY CHART VIDEO VIRTUAL OBSTETRICS VISIT ENCOUNTER NOTE  I connected with Andria Rhein on 06/30/19 at  1:15 PM EDT by My Chart video at home and verified that I am speaking with the correct person using two identifiers.   I discussed the limitations, risks, security and privacy concerns of performing an evaluation and management service by My Chart video and the availability of in person appointments. I also discussed with the patient that there may be a patient responsible charge related to this service. The patient expressed understanding and agreed to proceed.  Subjective:  Rhonda Fischer is a 29 y.o. G3P2002 at [redacted]w[redacted]d being followed for ongoing prenatal care.  She is currently monitored for the following issues for this low-risk pregnancy and has Language barrier; Supervision of low-risk pregnancy; Pneumonia due to COVID-19 virus; and Group beta Strep positive on their problem list.  Patient reports no complaints. She reports having contractions on Monday 06/29/19. She states the contractions went away. Reports fetal movement. Denies any contractions, bleeding or leaking of fluid.   The following portions of the patient's history were reviewed and updated as appropriate: allergies, current medications, past family history, past medical history, past social history, past surgical history and problem list.   Objective:   General:  Alert, oriented and cooperative.   Mental Status: Normal mood and affect perceived. Normal judgment and thought content.  Rest of physical exam deferred due to type of encounter  BP 101/69   Pulse 100   LMP 09/14/2018  **Done by patient's own at home BP cuff  Assessment and Plan:  Pregnancy: G3P2002 at [redacted]w[redacted]d  1. Group beta Strep positive - Will need abx in labor  2. Encounter for supervision of low-risk pregnancy in third trimester - Anticipatory guidance for postdates OB visit in the office to include NST and cervical check - IOL scheduled for  Saturday 07/11/19 @ 0700. Advised that someone will call her to tell her when she will need arrive at the hospital. Patient verbalized an understanding of the plan of care and agrees.   Term labor symptoms and general obstetric precautions including but not limited to vaginal bleeding, contractions, leaking of fluid and fetal movement were reviewed in detail with the patient.  I discussed the assessment and treatment plan with the patient. The patient was provided an opportunity to ask questions and all were answered. The patient agreed with the plan and demonstrated an understanding of the instructions. The patient was advised to call back or seek an in-person office evaluation/go to MAU at [facility] for any urgent or concerning symptoms. Please refer to After Visit Summary for other counseling recommendations.   I provided 5 minutes of non-face-to-face time during this encounter. There was 5 minutes of chart review time spent prior to this encounter. Total time spent = 10 minutes.  Return in about 1 week (around 07/07/2019) for Return OB visit, NST.  Future Appointments  Date Time Provider Department Center  07/11/2019  7:00 AM [facility] SCHED ROOM [facility] None    Raelyn Mora, CNM [facility] [employer], [facility]

## 2019-06-30 NOTE — Progress Notes (Signed)
I connected with  Rhonda Fischer on 06/30/19 at  1:15 PM EDT by telephone and verified that I am speaking with the correct person using two identifiers.   I discussed the limitations, risks, security and privacy concerns of performing an evaluation and management service by telephone and the availability of in person appointments. I also discussed with the patient that there may be a patient responsible charge related to this service. The patient expressed understanding and agreed to proceed.  Janene Madeira Caterina Racine, CMA 06/30/2019  1:01 PM

## 2019-07-03 ENCOUNTER — Other Ambulatory Visit: Payer: Self-pay | Admitting: Advanced Practice Midwife

## 2019-07-06 ENCOUNTER — Encounter: Payer: No Typology Code available for payment source | Admitting: Obstetrics & Gynecology

## 2019-07-06 ENCOUNTER — Other Ambulatory Visit: Payer: No Typology Code available for payment source

## 2019-07-09 ENCOUNTER — Other Ambulatory Visit (HOSPITAL_COMMUNITY): Payer: No Typology Code available for payment source

## 2019-07-09 ENCOUNTER — Ambulatory Visit (INDEPENDENT_AMBULATORY_CARE_PROVIDER_SITE_OTHER): Payer: No Typology Code available for payment source | Admitting: General Practice

## 2019-07-09 ENCOUNTER — Encounter: Payer: Self-pay | Admitting: Obstetrics and Gynecology

## 2019-07-09 ENCOUNTER — Other Ambulatory Visit: Payer: Self-pay

## 2019-07-09 ENCOUNTER — Inpatient Hospital Stay (HOSPITAL_COMMUNITY)
Admission: AD | Admit: 2019-07-09 | Discharge: 2019-07-11 | DRG: 807 | Disposition: A | Discharge status: Home / Self Care | Payer: No Typology Code available for payment source | Attending: Obstetrics and Gynecology | Admitting: Obstetrics and Gynecology

## 2019-07-09 ENCOUNTER — Ambulatory Visit (HOSPITAL_BASED_OUTPATIENT_CLINIC_OR_DEPARTMENT_OTHER)
Admission: RE | Admit: 2019-07-09 | Discharge: 2019-07-09 | Disposition: A | Discharge status: Home / Self Care | Payer: No Typology Code available for payment source | Source: Ambulatory Visit | Attending: Obstetrics and Gynecology | Admitting: Obstetrics and Gynecology

## 2019-07-09 ENCOUNTER — Ambulatory Visit (INDEPENDENT_AMBULATORY_CARE_PROVIDER_SITE_OTHER): Payer: No Typology Code available for payment source | Admitting: Obstetrics and Gynecology

## 2019-07-09 ENCOUNTER — Encounter (HOSPITAL_COMMUNITY): Payer: Self-pay | Admitting: Obstetrics and Gynecology

## 2019-07-09 VITALS — BP 119/71 | HR 99 | Wt 175.0 lb

## 2019-07-09 DIAGNOSIS — Z789 Other specified health status: Secondary | ICD-10-CM | POA: Diagnosis present

## 2019-07-09 DIAGNOSIS — O368330 Maternal care for abnormalities of the fetal heart rate or rhythm, third trimester, not applicable or unspecified: Secondary | ICD-10-CM | POA: Diagnosis not present

## 2019-07-09 DIAGNOSIS — Z3493 Encounter for supervision of normal pregnancy, unspecified, third trimester: Secondary | ICD-10-CM

## 2019-07-09 DIAGNOSIS — Z3A40 40 weeks gestation of pregnancy: Secondary | ICD-10-CM

## 2019-07-09 DIAGNOSIS — O99820 Streptococcus B carrier state complicating pregnancy: Secondary | ICD-10-CM

## 2019-07-09 DIAGNOSIS — O99824 Streptococcus B carrier state complicating childbirth: Secondary | ICD-10-CM | POA: Diagnosis present

## 2019-07-09 DIAGNOSIS — J1282 Pneumonia due to coronavirus disease 2019: Secondary | ICD-10-CM | POA: Diagnosis present

## 2019-07-09 DIAGNOSIS — O480 Post-term pregnancy: Principal | ICD-10-CM | POA: Diagnosis present

## 2019-07-09 DIAGNOSIS — O288 Other abnormal findings on antenatal screening of mother: Secondary | ICD-10-CM

## 2019-07-09 DIAGNOSIS — B951 Streptococcus, group B, as the cause of diseases classified elsewhere: Secondary | ICD-10-CM

## 2019-07-09 DIAGNOSIS — Z758 Other problems related to medical facilities and other health care: Secondary | ICD-10-CM | POA: Diagnosis present

## 2019-07-09 DIAGNOSIS — Z8616 Personal history of COVID-19: Secondary | ICD-10-CM

## 2019-07-09 LAB — CBC
HCT: 41.6 % (ref 36.0–46.0)
Hemoglobin: 13.2 g/dL (ref 12.0–15.0)
MCH: 26.1 pg (ref 26.0–34.0)
MCHC: 31.7 g/dL (ref 30.0–36.0)
MCV: 82.4 fL (ref 80.0–100.0)
Platelets: 150 10*3/uL (ref 150–400)
RBC: 5.05 MIL/uL (ref 3.87–5.11)
RDW: 15 % (ref 11.5–15.5)
WBC: 9.4 10*3/uL (ref 4.0–10.5)
nRBC: 0 % (ref 0.0–0.2)

## 2019-07-09 LAB — TYPE AND SCREEN
ABO/RH(D): B POS
Antibody Screen: NEGATIVE

## 2019-07-09 LAB — ABO/RH: ABO/RH(D): B POS

## 2019-07-09 MED ORDER — OXYTOCIN 40 UNITS IN NORMAL SALINE INFUSION - SIMPLE MED: 2.5000 [IU]/h | INTRAVENOUS | Status: DC

## 2019-07-09 MED ORDER — SODIUM CHLORIDE 0.9 % IV SOLN
5.0000 10*6.[IU] | Freq: Once | INTRAVENOUS | Status: AC
  Administered 2019-07-09: 5 10*6.[IU] via INTRAVENOUS
  Filled 2019-07-09: qty 5

## 2019-07-09 MED ORDER — TERBUTALINE SULFATE 1 MG/ML IJ SOLN: 0.2500 mg | Freq: Once | INTRAMUSCULAR | Status: DC | PRN

## 2019-07-09 MED ORDER — ONDANSETRON HCL 4 MG/2ML IJ SOLN: 4.0000 mg | INTRAMUSCULAR | Status: DC | PRN

## 2019-07-09 MED ORDER — SENNOSIDES-DOCUSATE SODIUM 8.6-50 MG PO TABS
2.0000 | ORAL_TABLET | ORAL | Status: DC
  Administered 2019-07-10 (×2): 2 via ORAL
  Filled 2019-07-09 (×2): qty 2

## 2019-07-09 MED ORDER — COCONUT OIL OIL: 1.0000 "application " | TOPICAL_OIL | Status: DC | PRN

## 2019-07-09 MED ORDER — ACETAMINOPHEN 325 MG PO TABS: 650.0000 mg | ORAL_TABLET | ORAL | Status: DC | PRN

## 2019-07-09 MED ORDER — PHENYLEPHRINE 40 MCG/ML (10ML) SYRINGE FOR IV PUSH (FOR BLOOD PRESSURE SUPPORT): 80.0000 ug | PREFILLED_SYRINGE | INTRAVENOUS | Status: DC | PRN

## 2019-07-09 MED ORDER — OXYTOCIN BOLUS FROM INFUSION
500.0000 mL | Freq: Once | INTRAVENOUS | Status: AC
  Administered 2019-07-09: 500 mL via INTRAVENOUS

## 2019-07-09 MED ORDER — ONDANSETRON HCL 4 MG/2ML IJ SOLN: 4.0000 mg | Freq: Four times a day (QID) | INTRAMUSCULAR | Status: DC | PRN

## 2019-07-09 MED ORDER — WITCH HAZEL-GLYCERIN EX PADS: 1.0000 "application " | MEDICATED_PAD | CUTANEOUS | Status: DC | PRN

## 2019-07-09 MED ORDER — DIPHENHYDRAMINE HCL 50 MG/ML IJ SOLN: 12.5000 mg | INTRAMUSCULAR | Status: DC | PRN

## 2019-07-09 MED ORDER — EPHEDRINE 5 MG/ML INJ: 10.0000 mg | INTRAVENOUS | Status: DC | PRN

## 2019-07-09 MED ORDER — PENICILLIN G POT IN DEXTROSE 60000 UNIT/ML IV SOLN
3.0000 10*6.[IU] | INTRAVENOUS | Status: DC
  Administered 2019-07-09: 3 10*6.[IU] via INTRAVENOUS
  Filled 2019-07-09: qty 50

## 2019-07-09 MED ORDER — SOD CITRATE-CITRIC ACID 500-334 MG/5ML PO SOLN: 30.0000 mL | ORAL | Status: DC | PRN

## 2019-07-09 MED ORDER — FENTANYL-BUPIVACAINE-NACL 0.5-0.125-0.9 MG/250ML-% EP SOLN: 12.0000 mL/h | EPIDURAL | Status: DC | PRN

## 2019-07-09 MED ORDER — ONDANSETRON HCL 4 MG PO TABS: 4.0000 mg | ORAL_TABLET | ORAL | Status: DC | PRN

## 2019-07-09 MED ORDER — LACTATED RINGERS IV SOLN: INTRAVENOUS | Status: DC

## 2019-07-09 MED ORDER — IBUPROFEN 600 MG PO TABS
600.0000 mg | ORAL_TABLET | Freq: Four times a day (QID) | ORAL | Status: DC
  Administered 2019-07-10 – 2019-07-11 (×7): 600 mg via ORAL
  Filled 2019-07-09 (×7): qty 1

## 2019-07-09 MED ORDER — DIBUCAINE (PERIANAL) 1 % EX OINT: 1.0000 "application " | TOPICAL_OINTMENT | CUTANEOUS | Status: DC | PRN

## 2019-07-09 MED ORDER — TETANUS-DIPHTH-ACELL PERTUSSIS 5-2.5-18.5 LF-MCG/0.5 IM SUSP: 0.5000 mL | Freq: Once | INTRAMUSCULAR | Status: DC

## 2019-07-09 MED ORDER — ZOLPIDEM TARTRATE 5 MG PO TABS: 5.0000 mg | ORAL_TABLET | Freq: Every evening | ORAL | Status: DC | PRN

## 2019-07-09 MED ORDER — SIMETHICONE 80 MG PO CHEW: 80.0000 mg | CHEWABLE_TABLET | ORAL | Status: DC | PRN

## 2019-07-09 MED ORDER — LIDOCAINE HCL (PF) 1 % IJ SOLN: 30.0000 mL | INTRAMUSCULAR | Status: DC | PRN

## 2019-07-09 MED ORDER — PRENATAL MULTIVITAMIN CH
1.0000 | ORAL_TABLET | Freq: Every day | ORAL | Status: DC
  Administered 2019-07-10 – 2019-07-11 (×2): 1 via ORAL
  Filled 2019-07-09 (×2): qty 1

## 2019-07-09 MED ORDER — DIPHENHYDRAMINE HCL 25 MG PO CAPS: 25.0000 mg | ORAL_CAPSULE | Freq: Four times a day (QID) | ORAL | Status: DC | PRN

## 2019-07-09 MED ORDER — MISOPROSTOL 50MCG HALF TABLET
50.0000 ug | ORAL_TABLET | ORAL | Status: DC | PRN
  Administered 2019-07-09: 50 ug via BUCCAL
  Filled 2019-07-09 (×2): qty 1

## 2019-07-09 MED ORDER — LACTATED RINGERS IV SOLN: 500.0000 mL | INTRAVENOUS | Status: DC | PRN

## 2019-07-09 MED ORDER — LACTATED RINGERS IV SOLN: 500.0000 mL | Freq: Once | INTRAVENOUS | Status: DC

## 2019-07-09 MED ORDER — BENZOCAINE-MENTHOL 20-0.5 % EX AERO: 1.0000 "application " | INHALATION_SPRAY | CUTANEOUS | Status: DC | PRN

## 2019-07-09 MED ORDER — OXYTOCIN 40 UNITS IN NORMAL SALINE INFUSION - SIMPLE MED
1.0000 m[IU]/min | INTRAVENOUS | Status: DC
  Administered 2019-07-09: 2 m[IU]/min via INTRAVENOUS
  Filled 2019-07-09: qty 1000

## 2019-07-09 NOTE — H&P (Addendum)
OBSTETRIC ADMISSION HISTORY AND PHYSICAL  Rhonda Fischer is a 29 y.o. female G52P2002 with IUP at [redacted]w[redacted]d by ultrasound presenting for IOL for postdates and nonreactive NST in clinic this AM and 4/10 BPP at MFM this AM. She was then sent for direct admit to labor floor. She reports +FMs, No LOF, no VB, no blurry vision, headaches or peripheral edema, and RUQ pain.  She plans on breast and bottle feeding. She request IUD in clinic for birth control. She received her prenatal care at [facility]   Dating: By ultrasound --->  Estimated Date of Delivery: 07/04/19  Sono:    @[redacted]w[redacted]d , CWD, normal anatomy, breech presentation, 456g, 67% EFW   Prenatal History/Complications:  2 previous vaginal deliveries without complications COVID PNA in Feb 2021  Past Medical History: Past Medical History:  Diagnosis Date  . GERD (gastroesophageal reflux disease)     Past Surgical History: Past Surgical History:  Procedure Laterality Date  . NO PAST SURGERIES      Obstetrical History: OB History    Gravida  3   Para  2   Term  2   Preterm  0   AB  0   Living  2     SAB  0   TAB  0   Ectopic  0   Multiple  0   Live Births  2           Social History Social History   Socioeconomic History  . Marital status: Married    Spouse name: Not on file  . Number of children: Not on file  . Years of education: Not on file  . Highest education level: Not on file  Occupational History  . Not on file  Tobacco Use  . Smoking status: Never Smoker  . Smokeless tobacco: Never Used  Substance and Sexual Activity  . Alcohol use: No  . Drug use: No  . Sexual activity: Yes    Birth control/protection: None  Other Topics Concern  . Not on file  Social History Narrative  . Not on file   Social Determinants of Health   Financial Resource Strain:   . Difficulty of Paying Living Expenses:   Food Insecurity: No Food Insecurity  . Worried About Charity fundraiser in the Last Year: Never true   . Ran Out of Food in the Last Year: Never true  Transportation Needs: No Transportation Needs  . Lack of Transportation (Medical): No  . Lack of Transportation (Non-Medical): No  Physical Activity:   . Days of Exercise per Week:   . Minutes of Exercise per Session:   Stress:   . Feeling of Stress :   Social Connections:   . Frequency of Communication with Friends and Family:   . Frequency of Social Gatherings with Friends and Family:   . Attends Religious Services:   . Active Member of Clubs or Organizations:   . Attends Archivist Meetings:   Marland Kitchen Marital Status:     Family History: History reviewed. No pertinent family history.  Allergies: No Known Allergies  Medications Prior to Admission  Medication Sig Dispense Refill Last Dose  . ibuprofen (ADVIL) 200 MG tablet Take 200 mg by mouth every 6 (six) hours as needed.     Marland Kitchen omeprazole (PRILOSEC) 20 MG capsule Take 1 capsule (20 mg total) by mouth daily. 30 capsule 3   . prenatal vitamin w/FE, FA (PRENATAL 1 + 1) 27-1 MG TABS tablet Take 1 tablet by  mouth daily at 12 noon. 30 tablet 11      Review of Systems   All systems reviewed and negative except as stated in HPI  Blood pressure 112/69, pulse 99, temperature 98.2 F (36.8 C), temperature source Oral, height 5\' 6"  (1.676 m), weight 79.8 kg, last menstrual period 09/14/2018. General appearance: alert, cooperative, appears stated age and no distress Lungs: clear to auscultation bilaterally Heart: regular rate and rhythm Abdomen: soft, non-tender; bowel sounds normal Pelvic: external vagina exam normal, 1/30%/-3 cervical exam with chaperone present Extremities: no sign of DVT Presentation: cephalic Fetal monitoringBaseline: 150 bpm, Variability: Good {> 6 bpm), Accelerations: Reactive and Decelerations: Absent Uterine activityNone     Prenatal labs: ABO, Rh: --/--/PENDING (04/22 1205) Antibody: PENDING (04/22 1205) Rubella: 31.90 (10/13 1531) RPR: Non  Reactive (01/12 0853)  HBsAg: Negative (10/13 1531)  HIV: Non Reactive (01/12 0853)  GBS: Positive/-- (03/23 1523)  2 hr Glucola WNL Genetic screening  WNL Anatomy 02-05-1982 WNL  Prenatal Transfer Tool  Maternal Diabetes: No Genetic Screening: Normal Maternal Ultrasounds/Referrals: Normal Fetal Ultrasounds or other Referrals:  None Maternal Substance Abuse:  No Significant Maternal Medications:  None Significant Maternal Lab Results: Group B Strep positive  Results for orders placed or performed during the hospital encounter of 07/09/19 (from the past 24 hour(s))  Type and screen   Collection Time: 07/09/19 12:05 PM  Result Value Ref Range   ABO/RH(D) PENDING    Antibody Screen PENDING    Sample Expiration      07/12/2019,2359 Performed at [facility], [address] [place], [place]     Patient Active Problem List   Diagnosis Date Noted  . Indication for care or intervention in labor or delivery 07/09/2019  . Non-reactive NST (non-stress test) 07/09/2019  . Group beta Strep positive 06/15/2019  . Pneumonia due to COVID-19 virus 04/29/2019  . Supervision of low-risk pregnancy 12/29/2018  . Language barrier 05/08/2012    Assessment/Plan:  Rhonda Fischer is a 29 y.o. G3P2002 at [redacted]w[redacted]d here for IOL for postdates, nonreactive stress test and BPP of 4.   Plan: -Admit to Labor and Delivery -Plan cx ripening with cytotec and foley, which was inserted at 12:46pm. During insertion of foley, accidental AROM. -PCN for GBS ppx #Labor: 1/30%/-3 on admission. Will continue with expected labor management for SVD #Pain: As patient requests #FWB: HR in 150s with moderate variability.  #ID: GBS positive, start PCN #Circ: N/a  [redacted]w[redacted]d, DO  07/09/2019, 12:49 PM  I saw and evaluated the patient. I agree with the findings and the plan of care as documented in the resident's note. Vertex by exam. Foley bulb was placed with subsequent prolonged decel thereafter and  removed. Reactive tracing for > 20 min thereafter and buccal Cytotec given. Will hold on FB as now 2 cm and multip; will replace as needed. Possible incidental AROM with FB placement with small leaking of fluid after placement but membrane still palpated on exam. Decel presumed from cord compression with FB; no cord felt on exam. Pit PRN. Anticipate SVD.   07/11/2019, MD [facility] Family Medicine Fellow, [facility] for [facility]

## 2019-07-09 NOTE — Progress Notes (Signed)
Subjective:  Rhonda Fischer is a 29 y.o. G3P2002 at [redacted]w[redacted]d being seen today for ongoing prenatal care.  She is currently monitored for the following issues for this low-risk pregnancy and has Language barrier; Supervision of low-risk pregnancy; Pneumonia due to COVID-19 virus; and Group beta Strep positive on their problem list.  Patient reports no complaints.  Contractions: Irregular. Vag. Bleeding: None.  Movement: Present. Denies leaking of fluid.   The following portions of the patient's history were reviewed and updated as appropriate: allergies, current medications, past family history, past medical history, past social history, past surgical history and problem list. Problem list updated.  Objective:   Vitals:   07/09/19 0831  BP: 119/71  Pulse: 99  Weight: 175 lb (79.4 kg)    Fetal Status: Fetal Heart Rate (bpm): NST   Movement: Present     General:  Alert, oriented and cooperative. Patient is in no acute distress.  Skin: Skin is warm and dry. No rash noted.   Cardiovascular: Normal heart rate noted  Respiratory: Normal respiratory effort, no problems with respiration noted  Abdomen: Soft, gravid, appropriate for gestational age. Pain/Pressure: Present     Pelvic:  Cervical exam deferred        Extremities: Normal range of motion.  Edema: None  Mental Status: Normal mood and affect. Normal behavior. Normal judgment and thought content.   Urinalysis:      Assessment and Plan:  Pregnancy: G3P2002 at 110w5d  1. Encounter for supervision of low-risk pregnancy in third trimester To L & D for IOL  2. Group beta Strep positive Tx while in labor  3. NST (non-stress test) nonreactive BPP 4/10 To L & D for IOL  Reviewed with pt Dr. Mitzi Hansen notified of need for IOL - [place] MFM FETAL BPP WO NON STRESS; Future  Term labor symptoms and general obstetric precautions including but not limited to vaginal bleeding, contractions, leaking of fluid and fetal movement were reviewed in  detail with the patient. Please refer to After Visit Summary for other counseling recommendations.  Return in about 4 weeks (around 08/06/2019) for PP visit from 07/11/19.   Hermina Staggers, MD

## 2019-07-09 NOTE — Discharge Summary (Addendum)
Postpartum Discharge Summary     Patient Name: Rhonda Fischer DOB: Jul 24, 1990 MRN: [id number]  Date of admission: 07/09/2019 Delivering Provider: Chauncey Mann   Date of discharge: 07/11/2019  Admitting diagnosis: Indication for care or intervention in labor or delivery [O75.9] Intrauterine pregnancy: [redacted]w[redacted]d    Secondary diagnosis:  Active Problems:   Language barrier   Pneumonia due to COVID-19 virus   Group beta Strep positive   Indication for care or intervention in labor or delivery   Non-reactive NST (non-stress test)   SVD (spontaneous vaginal delivery)  Additional problems: None     Discharge diagnosis: Term Pregnancy Delivered                                                                                                Post partum procedures:None  Augmentation: AROM, Pitocin and Cytotec  Complications: None  Hospital course:  Induction of Labor With Vaginal Delivery   29y.o. yo G3P2002 at 43w5das admitted to the hospital 07/09/2019 for induction of labor.  Indication for induction: post-dates and NRNST.  Patient had an uncomplicated labor course as follows: Initial SVE: AROM @ 1246 incidentally with foley bulb placement. She was given misopristol x 1, a foley bulb was inserted during admission, but due to one episode of prolonged decel, the foley bulb was removed and the prolonged decel went to normal HR and accels. Pitocin was started at 1748. She then progressed to complete. Membrane Rupture Time/Date: 12:46 PM ,07/09/2019   Intrapartum Procedures: Episiotomy: None [1]                                         Lacerations:  None [1]  Patient had delivery of a Viable infant.  Information for the patient's newborn:  SiNorinne, Jeane0[[id number]]Delivery Method: Vag-Spont    07/09/2019  Details of delivery can be found in separate delivery note.  Patient had a routine postpartum course. Patient is discharged home 07/11/19. Delivery time: 7:45 PM     Magnesium Sulfate received: No BMZ received: No Rhophylac:N/A MMR:N/A Transfusion:No  Physical exam  Vitals:   07/10/19 0000 07/10/19 0400 07/10/19 0802 07/10/19 1215  BP: (!) 102/93 97/67 101/74 112/69  Pulse: 99 90 71 81  Resp: 17 18 18 16   Temp: 97.9 F (36.6 C)  98.8 F (37.1 C)   TempSrc: Oral  Oral   SpO2: 100% 100% 100% 100%  Weight:      Height:       General: alert, cooperative and no distress Lochia: appropriate Uterine Fundus: firm Incision: N/A DVT Evaluation: No evidence of DVT seen on physical exam. No cords or calf tenderness. Labs: Lab Results  Component Value Date   WBC 16.9 (H) 07/10/2019   HGB 13.0 07/10/2019   HCT 41.2 07/10/2019   MCV 82.4 07/10/2019   PLT 153 07/10/2019   CMP Latest Ref Rng & Units 04/28/2019  Glucose 70 - 99 mg/dL 99  BUN 6 - 20 mg/dL 5(L)  Creatinine 0.44 - 1.00 mg/dL 0.60  Sodium 135 - 145 mmol/L 134(L)  Potassium 3.5 - 5.1 mmol/L 3.7  Chloride 98 - 111 mmol/L 106  CO2 22 - 32 mmol/L 18(L)  Calcium 8.9 - 10.3 mg/dL 8.3(L)  Total Protein 6.5 - 8.1 g/dL 6.1(L)  Total Bilirubin 0.3 - 1.2 mg/dL 0.6  Alkaline Phos 38 - 126 U/L 84  AST 15 - 41 U/L 34  ALT 0 - 44 U/L 28   Edinburgh Score: Edinburgh Postnatal Depression Scale Screening Tool 07/11/2019  I have been able to laugh and see the funny side of things. 0  I have looked forward with enjoyment to things. 0  I have blamed myself unnecessarily when things went wrong. 0  I have been anxious or worried for no good reason. 0  I have felt scared or panicky for no good reason. 0  Things have been getting on top of me. 0  I have been so unhappy that I have had difficulty sleeping. 0  I have felt sad or miserable. 0  I have been so unhappy that I have been crying. 0  The thought of harming myself has occurred to me. 0  Edinburgh Postnatal Depression Scale Total 0    Discharge instruction: per After Visit Summary and "Baby and Me Booklet".  After visit meds:   Allergies as of 07/11/2019   No Known Allergies     Medication List    TAKE these medications   ibuprofen 200 MG tablet Commonly known as: ADVIL Take 200 mg by mouth every 6 (six) hours as needed.   omeprazole 20 MG capsule Commonly known as: PriLOSEC Take 1 capsule (20 mg total) by mouth daily.   prenatal vitamin w/FE, FA 27-1 MG Tabs tablet Take 1 tablet by mouth daily at 12 noon.       Diet: routine diet  Activity: Advance as tolerated. Pelvic rest for 6 weeks.   Outpatient follow up:4 weeks Follow up Appt: Future Appointments  Date Time Provider [place]  08/11/2019  3:15 PM Virginia Rochester, NP [facility]   Follow up Visit: [place] [facility]. Schedule an appointment as soon as possible for a visit in 4 week(s).   Specialty: Obstetrics and Gynecology Why: Make appointment to be seen in 4 weeks for postpartum care Contact information: [address] [place], [place] [id number] [place] [phone]          Please schedule this patient for Postpartum visit in: 4 weeks with the following provider: Any provider In-person For C/S patients schedule nurse incision check in weeks 2 weeks: no Low risk pregnancy complicated by: none Delivery mode:  SVD Anticipated Birth Control:  outpatient IUD PP Procedures needed: outpatient IUD placement  Schedule Integrated [place] visit: no   Newborn Data: Live born female  Birth Weight: 3654g APGAR: 7, 9  Newborn Delivery   Birth date/time: 07/09/2019 19:45:00 Delivery type: Vaginal, Spontaneous      Baby Feeding: Breast Disposition:home with mother   07/11/2019 Lajean Manes, CNM

## 2019-07-09 NOTE — Progress Notes (Signed)
Labor Progress Note Rhonda Fischer is a 29 y.o. G3P2002 at [redacted]w[redacted]d presented for IOL for postdates and NRNST  S: Still feeling contractions and in more pain.   O:  BP (!) 110/58   Pulse 87   Temp 99 F (37.2 C) (Oral)   Ht 5\' 6"  (1.676 m)   Wt 79.8 kg   LMP 09/14/2018   BMI 28.39 kg/m   CVE: Dilation: 4.5 Effacement (%): 70 Cervical Position: Anterior Station: -2 Presentation: Vertex Exam by:: Fair   A&P: 29 y.o. 26 [redacted]w[redacted]d presented for IOL for postdates and NRNST #Labor: Progressing well. FB taken out, will start pitocin  #Pain: Tolerable now #FWB: Category 1, 67% EFW on 21w U/S #GBS positive, cont PCN  [redacted]w[redacted]d, DO 5:34 PM

## 2019-07-09 NOTE — Patient Instructions (Signed)

## 2019-07-09 NOTE — Progress Notes (Signed)
Patient's NST was nonreactive- per Dr Alysia Penna, patient to MFM for BPP.

## 2019-07-10 LAB — CBC
HCT: 41.2 % (ref 36.0–46.0)
Hemoglobin: 13 g/dL (ref 12.0–15.0)
MCH: 26 pg (ref 26.0–34.0)
MCHC: 31.6 g/dL (ref 30.0–36.0)
MCV: 82.4 fL (ref 80.0–100.0)
Platelets: 153 10*3/uL (ref 150–400)
RBC: 5 MIL/uL (ref 3.87–5.11)
RDW: 15 % (ref 11.5–15.5)
WBC: 16.9 10*3/uL — ABNORMAL HIGH (ref 4.0–10.5)
nRBC: 0 % (ref 0.0–0.2)

## 2019-07-10 LAB — RPR: RPR Ser Ql: NONREACTIVE

## 2019-07-10 NOTE — Lactation Note (Signed)
This note was copied from a baby's chart. Lactation Consultation Note  Patient Name: Rhonda Fischer Date: 07/10/2019 Reason for consult: Initial assessment;Term  P3 mother whose infant is now 5 hours old.  This is a term baby at 40+5 weeks.   Mother breast fed her first child for 2 months and her second child for 7 months.  Baby was swaddled and asleep when I arrived.  Mother had no questions/concerns related to breast feeding.  Her baby has been latching and feeding well.    Mother's breasts are soft and non tender and nipples are everted and intact.  Mother will feed 8-12 times/24 hours or sooner if baby shows feeding cues.  Reviewed cues and discussed hand expression.  Suggested mother continue to practice hand expression before/after feeding to help increase milk supply.  Container provided and milk storage times reviewed.  Finger feeding demonstrated.  Mother's husband is a Producer, television/film/video and mother is eligible to receive a DEBP.  Provided the information on available pumps and mother will research online.  She will call me back when she has decided on which pump she desires.     Maternal Data Formula Feeding for Exclusion: Yes Has patient been taught Hand Expression?: Yes Does the patient have breastfeeding experience prior to this delivery?: Yes  Feeding    LATCH Score                   Interventions    Lactation Tools Discussed/Used WIC Program: No   Consult Status Consult Status: Follow-up Date: 07/11/19 Follow-up type: In-patient    Lavell Ridings R Grant Henkes 07/10/2019, 3:00 PM

## 2019-07-10 NOTE — Lactation Note (Signed)
This note was copied from a baby's chart. Lactation Consultation Note Attempted to see mom but she was sleeping.  Patient Name: Rhonda Fischer ESLPN'P Date: 07/10/2019     Maternal Data    Feeding Feeding Type: Bottle Fed - Formula Nipple Type: Slow - flow  LATCH Score                   Interventions    Lactation Tools Discussed/Used     Consult Status      Jr Milliron G 07/10/2019, 1:44 AM

## 2019-07-10 NOTE — Progress Notes (Signed)
Post Partum Day 1 Subjective: no complaints, up ad lib, voiding and tolerating PO  Objective: Blood pressure 97/67, pulse 90, temperature 97.9 F (36.6 C), temperature source Oral, resp. rate 18, height 5\' 6"  (1.676 m), weight 79.8 kg, last menstrual period 09/14/2018, SpO2 100 %, unknown if currently breastfeeding.  Physical Exam:  General: alert, cooperative and no distress Lochia: appropriate Uterine Fundus: firm Incision: n/a DVT Evaluation: No evidence of DVT seen on physical exam.  Recent Labs    07/09/19 1205 07/10/19 0523  HGB 13.2 13.0  HCT 41.6 41.2    Assessment/Plan: Plan for discharge tomorrow and Breastfeeding   LOS: 1 day   07/12/19 07/10/2019, 7:20 AM

## 2019-07-10 NOTE — Lactation Note (Signed)
This note was copied from a baby's chart. Lactation Consultation Note  Patient Name: [name] OEUMP'N Date: 07/10/2019 Reason for consult: Follow-up assessment  LC Follow Up Visit:  Mother has chosen her [employer] employee DEBP.  Provided her with the [employer] DEBP.  All paperwork filled out and placed in the appropriate folder.  Insurance card returned to mother.   Lactation Tools Discussed/Used WIC Program: No   Consult Status Consult Status: Follow-up Date: 07/11/19 Follow-up type: In-patient    Dora Sims 07/10/2019, 4:28 PM

## 2019-07-11 ENCOUNTER — Inpatient Hospital Stay (HOSPITAL_COMMUNITY): Payer: No Typology Code available for payment source

## 2019-07-11 NOTE — Progress Notes (Signed)
CSW was contacted due to MOB not having suitable car seat for infant's discharge. CSW offered car seat. MOB and FOB did not have $30 for hospital provided seat so it was provided without charge.  CSW provided [employer] Car Seat  Model Number [id number] Date Manufacture:10-03-2018  Nevin Grizzle D. Chaeli Judy, MSW, LCSWA Clinical Social Worker [phone] 

## 2019-07-11 NOTE — Discharge Instructions (Signed)

## 2019-07-11 NOTE — Lactation Note (Signed)
This note was copied from a baby's chart. Lactation Consultation Note  Patient Name: Rhonda Fischer Date: 07/11/2019 Reason for consult: Follow-up assessment  P3 mother whose infant is now 22 hours old.  This is a term baby at 40+5 weeks.  Mother breast fed her first child for 2 months and her second child for 7 months.  Mother's feeding preference on admission was breast/bottle.  She has been primarily bottle feeding.  Baby was asleep in father's arms when I arrived.  Mother had no questions/concerns related to breast feeding.  She feels like baby latches well and denies pain with latching.    Mother is familiar with engorgement prevention/treatment.  Offered a manual pump, however, mother politely declined.  I provided her with a [employer] employee DEBP yesterday.  Mother has our OP phone number for questions/concerns after discharge.  Mother is looking forward to being discharged.   Maternal Data    Feeding Feeding Type: Breast Fed  LATCH Score                   Interventions    Lactation Tools Discussed/Used     Consult Status Consult Status: Complete Date: 07/11/19 Follow-up type: Call as needed    Rhonda Fischer 07/11/2019, 7:59 AM

## 2019-08-11 ENCOUNTER — Ambulatory Visit (INDEPENDENT_AMBULATORY_CARE_PROVIDER_SITE_OTHER): Payer: No Typology Code available for payment source | Admitting: Nurse Practitioner

## 2019-08-11 ENCOUNTER — Encounter: Payer: Self-pay | Admitting: Nurse Practitioner

## 2019-08-11 ENCOUNTER — Other Ambulatory Visit: Payer: Self-pay

## 2019-08-11 DIAGNOSIS — Z1332 Encounter for screening for maternal depression: Secondary | ICD-10-CM | POA: Diagnosis not present

## 2019-08-11 NOTE — Progress Notes (Signed)
    Post Partum Visit Note  Rhonda Fischer is a 29 y.o. G85P3003 female who presents for a postpartum visit. She is 4 weeks postpartum following a SVD.  I have fully reviewed the prenatal and intrapartum course. The delivery was at 40.5 gestational weeks.  Anesthesia: none. Postpartum course has been unremarkable Baby is doing well. Baby is feeding by both breast and bottle - Enfamil with Iron. Bleeding no bleeding. Bowel function is abnormal: constipation. Bladder function is normal. Patient is not sexually active. Contraception method is IUD.  Does not want to have IUD inserted today.  Wants to return in 2 weeks.  Has not had intercourse and plans no intercourse until IUD is inserted.  Postpartum depression screening: Negative.  The following portions of the patient's history were reviewed and updated as appropriate: allergies, current medications, past family history, past medical history, past social history, past surgical history and problem list.  Review of Systems Pertinent items noted in HPI and remainder of comprehensive ROS otherwise negative.    Objective:  Weight 171 lb (77.6 kg), last menstrual period 09/14/2018, unknown if currently breastfeeding.  General:  alert, cooperative and no distress   Breasts:  deferred - breastfeeding  Lungs: clear to auscultation bilaterally  Heart:  regular rate and rhythm, S1, S2 normal, no murmur, click, rub or gallop  Abdomen: soft, nontender   Vulva:  Pelvic deferred  Vagina:   Cervix:    Corpus:   Adnexa:    Rectal Exam:         Assessment:    Normal postpartum exam. Pap smear 12/30/2018.  Plan:   Essential components of care per ACOG recommendations:  1.  Mood and well being: Patient with negative depression screening today. Reviewed local resources for support.  - Patient does not use tobacco.  - hx of drug use? No   2. Infant care and feeding:  -Patient currently breastmilk feeding? Yes  -Social determinants of health (SDOH)  reviewed in EPIC. No concerns  3. Sexuality, contraception and birth spacing - Patient does not want a pregnancy in the next year.   - Reviewed forms of contraception in tiered fashion. Patient desired no method today.  Wants to return in 2 weeks for IUD check.    - Discussed birth spacing of 18 months  4. Sleep and fatigue -Encouraged family/partner/community support of 4 hrs of uninterrupted sleep to help with mood and fatigue  5. Physical Recovery  - Discussed patients delivery and complications - Patient has urinary incontinence? No - Patient is safe to resume physical and sexual activity - does not plan to have sex until after the IUD is inserted.  6.  Health Maintenance - Last pap smear done 2015 and was normal   Pap smear done today.   Ernestina Patches, CMA [facility] [employer], [facility]   Nolene Bernheim, RN, MSN, NP-BC Nurse Practitioner, [facility] for [employer], [facility] 08/11/2019 4:32 PM

## 2019-08-27 ENCOUNTER — Encounter (INDEPENDENT_AMBULATORY_CARE_PROVIDER_SITE_OTHER): Payer: No Typology Code available for payment source | Admitting: Obstetrics and Gynecology

## 2019-11-11 ENCOUNTER — Other Ambulatory Visit: Payer: Self-pay

## 2020-01-21 ENCOUNTER — Other Ambulatory Visit: Payer: Self-pay

## 2020-01-21 ENCOUNTER — Emergency Department (HOSPITAL_BASED_OUTPATIENT_CLINIC_OR_DEPARTMENT_OTHER)
Admission: EM | Admit: 2020-01-21 | Discharge: 2020-01-21 | Disposition: A | Discharge status: Home / Self Care | Payer: No Typology Code available for payment source | Attending: Emergency Medicine | Admitting: Emergency Medicine

## 2020-01-21 ENCOUNTER — Other Ambulatory Visit (HOSPITAL_BASED_OUTPATIENT_CLINIC_OR_DEPARTMENT_OTHER): Payer: Self-pay | Admitting: Student

## 2020-01-21 ENCOUNTER — Encounter (HOSPITAL_BASED_OUTPATIENT_CLINIC_OR_DEPARTMENT_OTHER): Payer: Self-pay | Admitting: *Deleted

## 2020-01-21 DIAGNOSIS — L03031 Cellulitis of right toe: Secondary | ICD-10-CM | POA: Insufficient documentation

## 2020-01-21 DIAGNOSIS — Z8616 Personal history of COVID-19: Secondary | ICD-10-CM | POA: Insufficient documentation

## 2020-01-21 MED ORDER — CEPHALEXIN 500 MG PO CAPS: 500.0000 mg | ORAL_CAPSULE | Freq: Four times a day (QID) | ORAL | 0 refills | Status: DC

## 2020-01-21 NOTE — Discharge Instructions (Signed)
You have paronychia.  I prescribed you antibiotics please take as prescribed.  I recommend applying warm compresses to the area as this can help bring the infection to ahead and drain on its own.  I also recommend taking over-the-counter pain medications like ibuprofen or Tylenol every 6 as needed please follow dosing the back of bottle.  I recommend following up with your PCP for further evaluation if symptoms not resolved within 7 days.  Come back to emergency department if you develop fevers, chills, worsening pain or redness around your toe, or if you develop chest pain, shortness of breath, severe abdominal pain, uncontrolled nausea, vomiting, diarrhea.

## 2020-01-21 NOTE — ED Triage Notes (Signed)
Right great toe pain.  Denies injury.

## 2020-01-21 NOTE — ED Provider Notes (Signed)
[facility] EMERGENCY DEPARTMENT Provider Note   CSN: [id number] Arrival date & time: 01/21/20  8250     History Chief Complaint  Patient presents with  . Toe pain    Rhonda Fischer is a 29 y.o. female.  HPI   Patient with no significant medical history presents to the emergency department with chief complaint of right great toe pain.  Patient states pain started yesterday morning and has progressively gotten worse.  She denies recent trauma to the area but noted that the distal end of her toe appeared to be red.  She denies IV drug use, fevers, chills, rashes, no history of gout or other autoimmune diseases.  She denies recent toe nail clipping or wearing tight fitting, shoes has never had paronychia in the past.  She is not immunocompromised and has not taking any medications in the last month.  She states her toe is very tender when she touches it but is able to bear weight on it and move her toes without difficulty.  Patient denies headache, fever, chills, shortness of breath, chest pain, abdominal pain, nausea, vomiting, diarrhea.  Past Medical History:  Diagnosis Date  . GERD (gastroesophageal reflux disease)     Patient Active Problem List   Diagnosis Date Noted  . SVD (spontaneous vaginal delivery) 07/11/2019  . Indication for care or intervention in labor or delivery 07/09/2019  . Non-reactive NST (non-stress test) 07/09/2019  . Group beta Strep positive 06/15/2019  . Pneumonia due to COVID-19 virus 04/29/2019  . Supervision of low-risk pregnancy 12/29/2018  . Language barrier 05/08/2012    Past Surgical History:  Procedure Laterality Date  . NO PAST SURGERIES       OB History    Gravida  3   Para  3   Term  3   Preterm  0   AB  0   Living  3     SAB  0   TAB  0   Ectopic  0   Multiple  0   Live Births  3           History reviewed. No pertinent family history.  Social History   Tobacco Use  . Smoking status: Never Smoker   . Smokeless tobacco: Never Used  Vaping Use  . Vaping Use: Never used  Substance Use Topics  . Alcohol use: No  . Drug use: No    Home Medications Prior to Admission medications   Medication Sig Start Date End Date Taking? Authorizing Provider  cephALEXin (KEFLEX) 500 MG capsule Take 1 capsule (500 mg total) by mouth 4 (four) times daily for 7 days. 01/21/20 01/28/20  Carroll Sage, PA-C  ibuprofen (ADVIL) 200 MG tablet Take 200 mg by mouth every 6 (six) hours as needed.    [provider]  omeprazole (PRILOSEC) 20 MG capsule Take 1 capsule (20 mg total) by mouth daily. 04/16/19   Allie Bossier, MD    Allergies    Patient has no known allergies.  Review of Systems   Review of Systems  Constitutional: Negative for chills and fever.  HENT: Negative for congestion, tinnitus, trouble swallowing and voice change.   Respiratory: Negative for cough and shortness of breath.   Cardiovascular: Negative for chest pain.  Gastrointestinal: Negative for abdominal pain, diarrhea, nausea and vomiting.  Genitourinary: Negative for enuresis, flank pain, frequency and genital sores.  Musculoskeletal: Negative for back pain.       Right big toe pain  Skin: Negative for rash.  Neurological: Negative for dizziness and headaches.  Hematological: Does not bruise/bleed easily.    Physical Exam Updated Vital Signs BP 114/78 (BP Location: Right Arm)   Pulse 67   Temp 98.2 F (36.8 C) (Oral)   Resp 16   Ht 5\' 4"  (1.626 m)   LMP 01/11/2020   SpO2 100%   BMI 29.35 kg/m   Physical Exam Vitals and nursing note reviewed.  Constitutional:      General: She is not in acute distress.    Appearance: She is not ill-appearing.  HENT:     Head: Normocephalic and atraumatic.     Nose: No congestion.     Mouth/Throat:     Mouth: Mucous membranes are moist.     Pharynx: Oropharynx is clear.  Eyes:     General: No scleral icterus. Cardiovascular:     Rate and Rhythm: Normal rate and  regular rhythm.     Pulses: Normal pulses.     Heart sounds: No murmur heard.  No friction rub. No gallop.   Pulmonary:     Effort: No respiratory distress.     Breath sounds: No wheezing, rhonchi or rales.  Musculoskeletal:        General: Tenderness present. No swelling.     Right lower leg: No edema.     Left lower leg: No edema.     Comments: Patient's right foot was visualized, her great toe at the distal lateral aspect was erythematous and had slight edema.  Toenail was intact, no lacerations, abrasions, other gross abnormalities noted.  Area was warm to the touch, exquisitely tender to palpation, no fluctuance or induration felt.  Patient had full range of motion at the distal, middle, proximal joint.  Skin:    General: Skin is warm and dry.     Coloration: Skin is not jaundiced or pale.     Findings: No lesion or rash.     Comments: Skin exam was performed no rashes, abrasions, track marks, red swollen joints noted on exam.  Neurological:     Mental Status: She is alert.  Psychiatric:        Mood and Affect: Mood normal.     ED Results / Procedures / Treatments   Labs (all labs ordered are listed, but only abnormal results are displayed) Labs Reviewed - No data to display  EKG None  Radiology No results found.  Procedures Procedures (including critical care time)  Medications Ordered in ED Medications - No data to display  ED Course  I have reviewed the triage vital signs and the nursing notes.  Pertinent labs & imaging results that were available during my care of the patient were reviewed by me and considered in my medical decision making (see chart for details).    MDM Rules/Calculators/A&P                          Patient presents with right great toe pain.  She is alert, did not appear acute distress, vital signs reassuring.  Due to well-appearing patient, benign physical exam further lab and imaging were not warranted at this time.  I have low  suspicion for septic arthritis as patient denies IV drug use, skin exam was performed no erythematous, edematous, warm joints noted on exam, no new heart murmur heard on exam.  Low suspicion for fracture or dislocation as patient denies recent trauma to the area, no deformities palpated on exam.  Low suspicion for ligament or tendon damage as area was palpated no gross defects noted, they had full range of motion at the proximal, middle, distal joint.  Low suspicion for compartment syndrome as area was palpated it was soft to the touch, neurovascular fully intact.  I have low suspicion for gout at this time as the erythema was on the lateral distal aspect of her great toe, it did not encroach at her joints which is typical of counts.  I suspect patient suffering from paronychia and will provide her with antibiotics and have her follow-up with her PCP for further evaluation.  Vital signs have remained stable, no indication for hospital admission.  Patient discussed with attending and they agreed with assessment and plan.  Patient given at home care as well strict return precautions.  Patient verbalized that they understood agreed to said plan.   Final Clinical Impression(s) / ED Diagnoses Final diagnoses:  Paronychia of great toe of right foot    Rx / DC Orders ED Discharge Orders         Ordered    cephALEXin (KEFLEX) 500 MG capsule  4 times daily        01/21/20 1055           Barnie Del 01/21/20 1145    Vanetta Mulders, MD 01/22/20 [id number]

## 2020-01-21 NOTE — ED Notes (Signed)
Pt states toe was itching yesterday and this am work w slight redness and swelling

## 2020-02-25 ENCOUNTER — Emergency Department (HOSPITAL_BASED_OUTPATIENT_CLINIC_OR_DEPARTMENT_OTHER)
Admission: EM | Admit: 2020-02-25 | Discharge: 2020-02-25 | Disposition: A | Discharge status: Home / Self Care | Payer: No Typology Code available for payment source | Attending: Emergency Medicine | Admitting: Emergency Medicine

## 2020-02-25 ENCOUNTER — Encounter (HOSPITAL_BASED_OUTPATIENT_CLINIC_OR_DEPARTMENT_OTHER): Payer: Self-pay | Admitting: *Deleted

## 2020-02-25 ENCOUNTER — Other Ambulatory Visit: Payer: Self-pay

## 2020-02-25 DIAGNOSIS — M545 Low back pain, unspecified: Secondary | ICD-10-CM | POA: Insufficient documentation

## 2020-02-25 DIAGNOSIS — Z8616 Personal history of COVID-19: Secondary | ICD-10-CM | POA: Insufficient documentation

## 2020-02-25 DIAGNOSIS — R519 Headache, unspecified: Secondary | ICD-10-CM | POA: Insufficient documentation

## 2020-02-25 MED ORDER — IBUPROFEN 800 MG PO TABS: 800.0000 mg | ORAL_TABLET | Freq: Three times a day (TID) | ORAL | 0 refills | Status: DC
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
## 2020-03-09 ENCOUNTER — Encounter (HOSPITAL_BASED_OUTPATIENT_CLINIC_OR_DEPARTMENT_OTHER): Payer: Self-pay | Admitting: *Deleted

## 2020-03-13 IMAGING — CT CT ANGIO CHEST
2 of 6 series · 18 of 36 positions shown · IV contrast (omnipaque)
Comparison: None.

CLINICAL DATA: Chest pain, pregnancy hypoxia

EXAM:
CT ANGIOGRAPHY CHEST WITH CONTRAST
TECHNIQUE: Multidetector CT imaging of the chest was performed using the
standard protocol during bolus administration of intravenous
contrast. Multiplanar CT image reconstructions and MIPs were
obtained to evaluate the vascular anatomy.
CONTRAST:  75mL OMNIPAQUE IOHEXOL 350 MG/ML SOLN

[Series 7: pe thins · axial · 0.82mm/px · z∈[+1024,+1250]mm · 17 of 361 slices shown]
[im 19/361  lung]
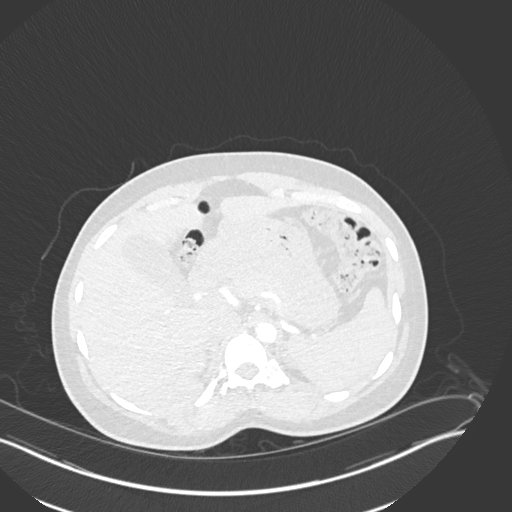
[im 37/361  mediastinal]
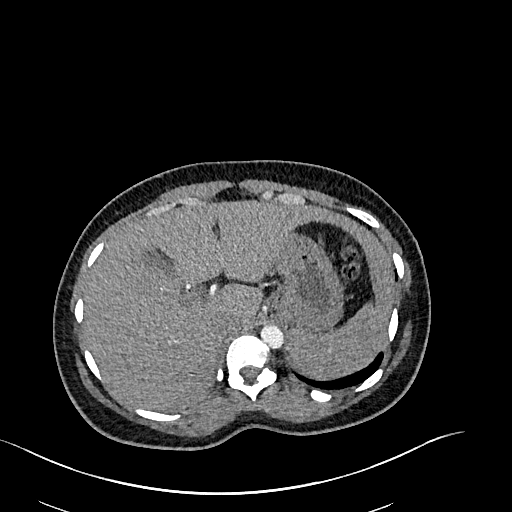
[im 55/361  lung]
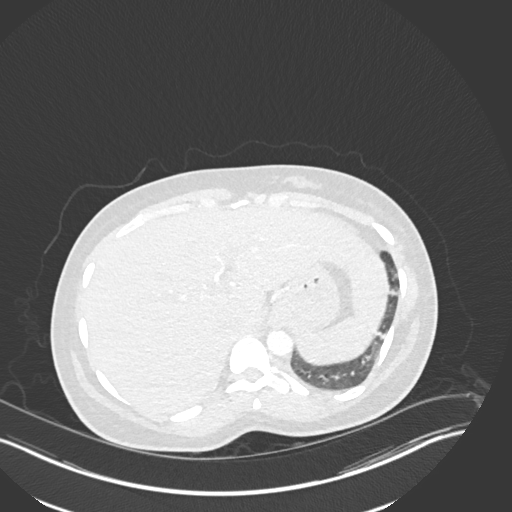
[im 73/361  mediastinal]
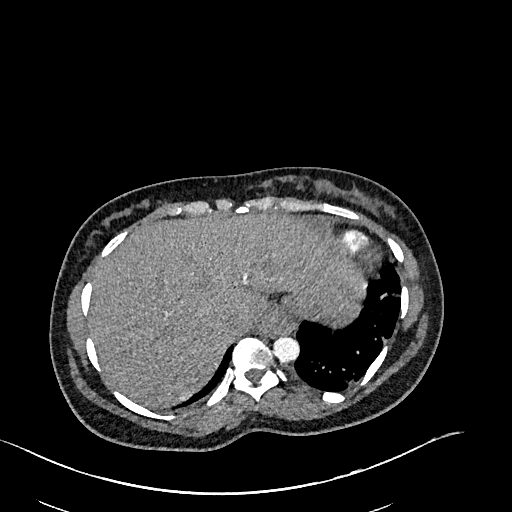
[im 109/361  lung]
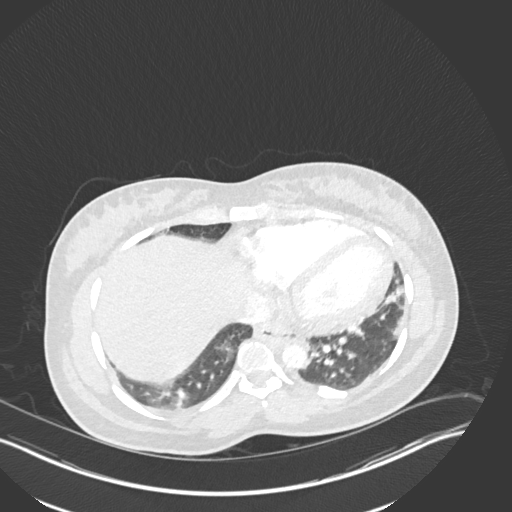
[im 127/361  mediastinal]
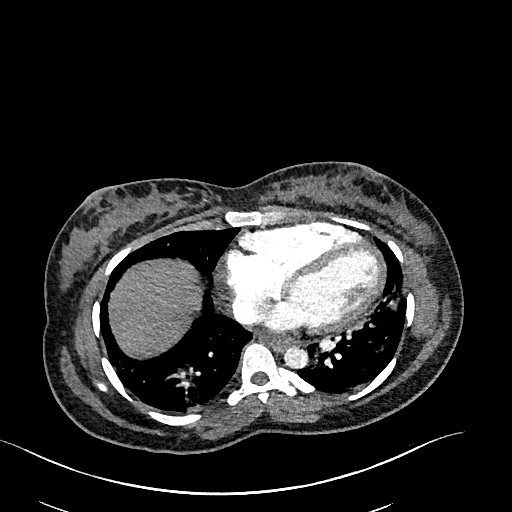
[im 145/361  lung]
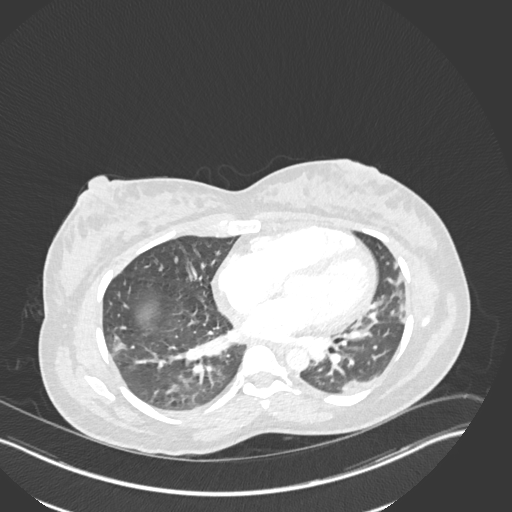
[im 163/361  mediastinal]
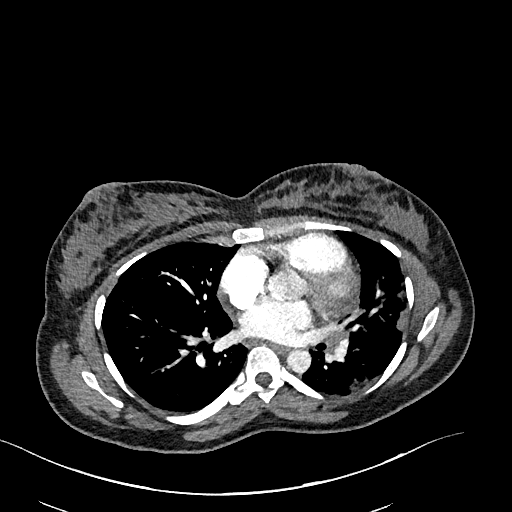
[im 181/361  lung]
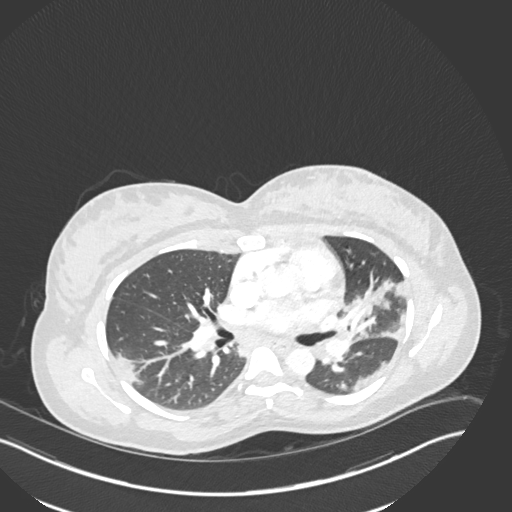
[im 199/361  mediastinal]
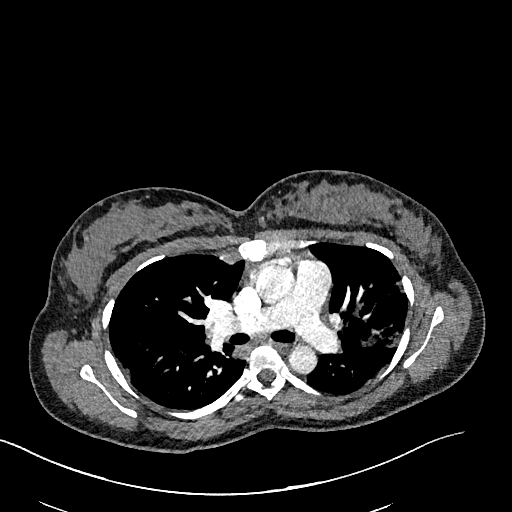
[im 217/361  lung]
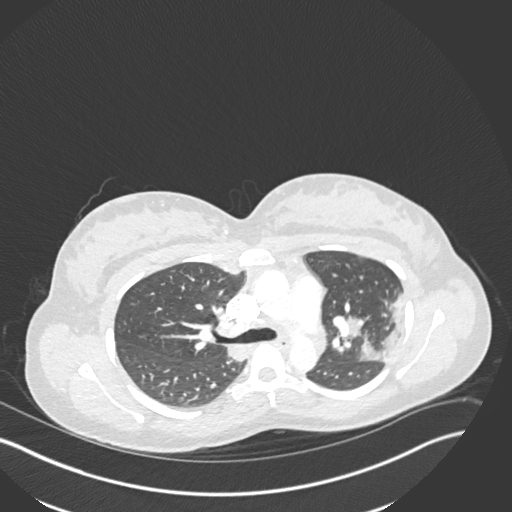
[im 235/361  mediastinal]
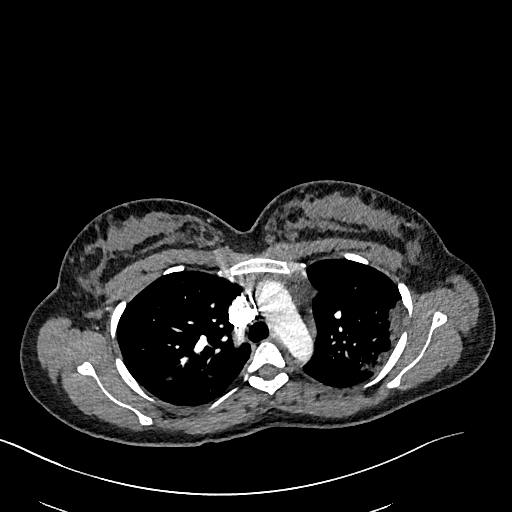
[im 253/361  lung]
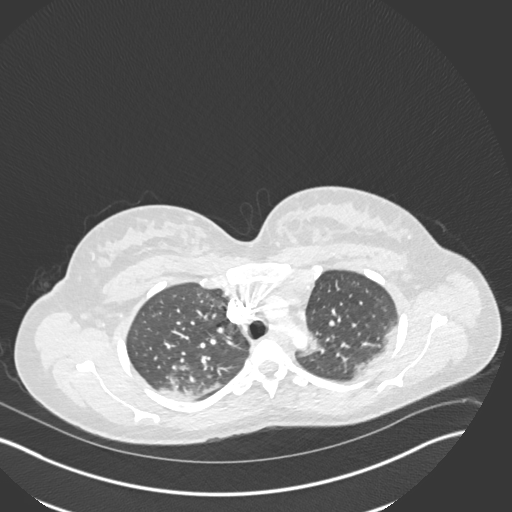
[im 289/361  mediastinal]
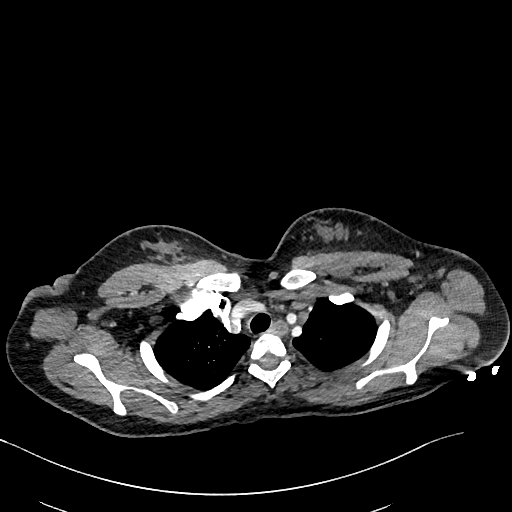
[im 307/361  lung]
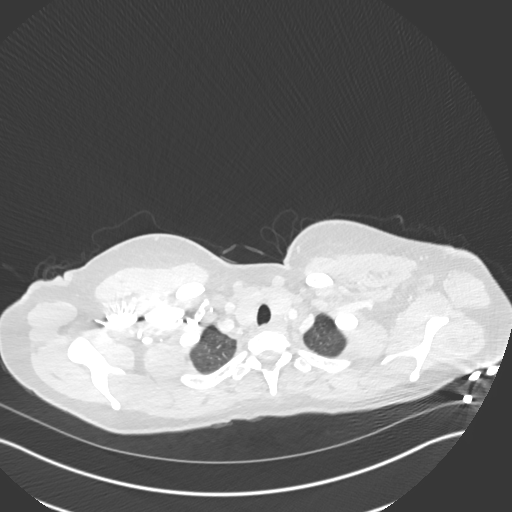
[im 325/361  mediastinal]
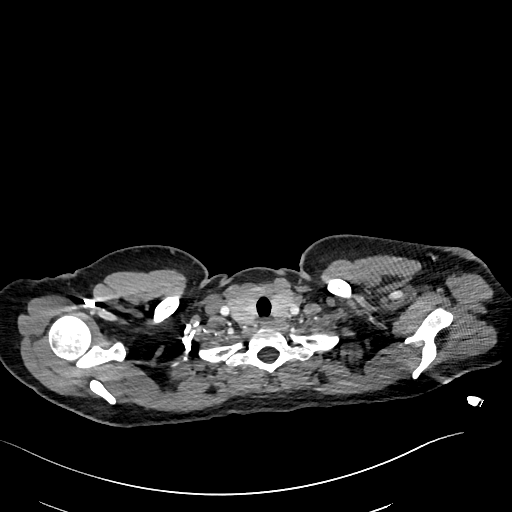
[im 343/361  lung]
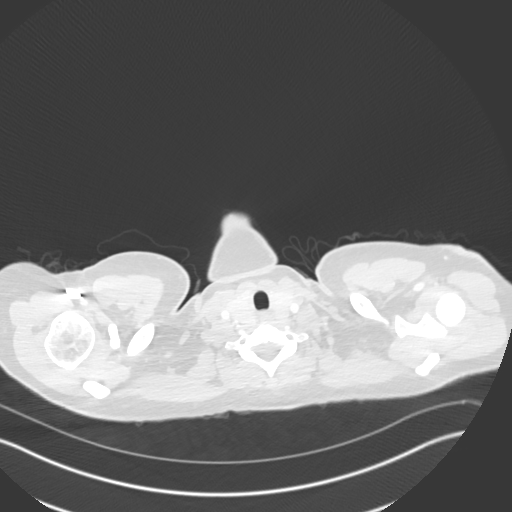

[Series 8: pe 2mm cor · coronal · 0.46mm/px · 1 of 113 slices shown]
[im 57/113  mediastinal]
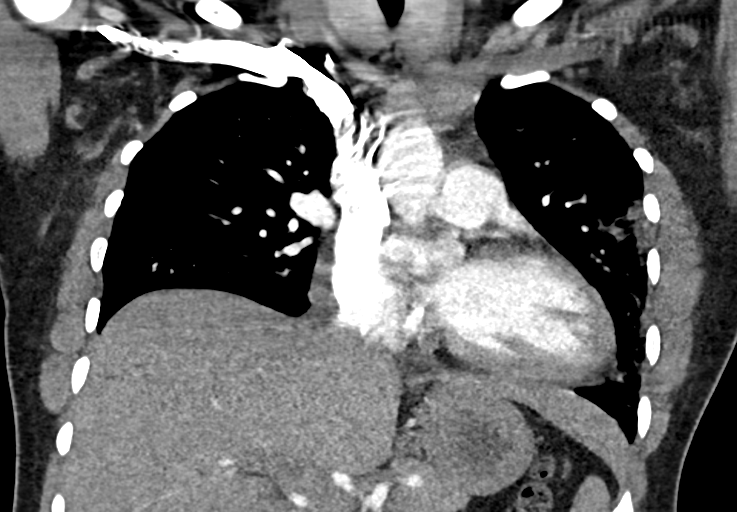

[18 of 36 positions shown; findings below may reference images not displayed]

FINDINGS: Cardiovascular: Slightly suboptimal opacification of the main
pulmonary artery. No central or segmental pulmonary embolism.
Limited visualization of the distal subsegmental branches. The
heart is normal in size. No pericardial effusion or thickening. No
evidence right heart strain. There is normal three-vessel
brachiocephalic anatomy without proximal stenosis. The thoracic
aorta is normal in appearance.

Mediastinum/Nodes: No hilar, mediastinal, or axillary adenopathy.
Thyroid gland, trachea, and esophagus demonstrate no significant
findings.

Lungs/Pleura: Multifocal patchy ground-glass opacities are seen
predominantly at the periphery throughout both lungs. No air
bronchograms are seen. No pneumothorax or pleural effusion.

Upper Abdomen: No acute abnormalities present in the visualized
portions of the upper abdomen.

Musculoskeletal: No chest wall abnormality. No acute or significant
osseous findings.

Review of the MIP images confirms the above findings.
IMPRESSION: Slightly suboptimal opacification of the main pulmonary artery. No
central or segmental pulmonary embolism.

Multifocal patchy/ground-glass opacity seen throughout both lungs,
consistent with multifocal pneumonia.

## 2021-01-30 ENCOUNTER — Emergency Department (INDEPENDENT_AMBULATORY_CARE_PROVIDER_SITE_OTHER)
Admission: EM | Admit: 2021-01-30 | Discharge: 2021-01-30 | Disposition: A | Discharge status: Home / Self Care | Payer: Self-pay | Source: Home / Self Care

## 2021-01-30 ENCOUNTER — Other Ambulatory Visit: Payer: Self-pay

## 2021-01-30 DIAGNOSIS — J309 Allergic rhinitis, unspecified: Secondary | ICD-10-CM

## 2021-01-30 DIAGNOSIS — R059 Cough, unspecified: Secondary | ICD-10-CM

## 2021-01-30 DIAGNOSIS — R519 Headache, unspecified: Secondary | ICD-10-CM

## 2021-01-30 MED ORDER — FEXOFENADINE HCL 180 MG PO TABS: 180.0000 mg | ORAL_TABLET | Freq: Every day | ORAL | 0 refills | Status: DC

## 2021-01-30 MED ORDER — BENZONATATE 200 MG PO CAPS: 200.0000 mg | ORAL_CAPSULE | Freq: Three times a day (TID) | ORAL | 0 refills | Status: AC | PRN

## 2021-01-30 NOTE — ED Provider Notes (Signed)
[facility]    CSN: [id number] Arrival date & time: 01/30/21  1729      History   Chief Complaint Chief Complaint  Patient presents with   Fever   Cough    HPI Rhonda Fischer is a 30 y.o. female.   HPI 30 year old female presents with cough, HA< and fever x 2 days.  She is accompanied by her husband this evening.  Past Medical History:  Diagnosis Date   GERD (gastroesophageal reflux disease)     Patient Active Problem List   Diagnosis Date Noted   SVD (spontaneous vaginal delivery) 07/11/2019   Indication for care or intervention in labor or delivery 07/09/2019   Non-reactive NST (non-stress test) 07/09/2019   Group beta Strep positive 06/15/2019   Pneumonia due to COVID-19 virus 04/29/2019   Supervision of low-risk pregnancy 12/29/2018   Language barrier 05/08/2012    Past Surgical History:  Procedure Laterality Date   NO PAST SURGERIES      OB History     Gravida  3   Para  3   Term  3   Preterm  0   AB  0   Living  3      SAB  0   IAB  0   Ectopic  0   Multiple      Live Births  3            Home Medications    Prior to Admission medications   Medication Sig Start Date End Date Taking? Authorizing Provider  benzonatate (TESSALON) 200 MG capsule Take 1 capsule (200 mg total) by mouth 3 (three) times daily as needed for up to 7 days for cough. 01/30/21 02/06/21 Yes Trevor Iha, FNP  fexofenadine [facility] ALLERGY) 180 MG tablet Take 1 tablet (180 mg total) by mouth daily for 15 days. 01/30/21 02/14/21 Yes Trevor Iha, FNP  ibuprofen (ADVIL) 200 MG tablet Take 200 mg by mouth every 6 (six) hours as needed.    [provider]  ibuprofen (ADVIL) 800 MG tablet Take 1 tablet (800 mg total) by mouth 3 (three) times daily. 02/25/20   Arby Barrette, MD  omeprazole (PRILOSEC) 20 MG capsule Take 1 capsule (20 mg total) by mouth daily. 04/16/19   Allie Bossier, MD    Family History History reviewed. No  pertinent family history.  Social History Social History   Tobacco Use   Smoking status: Never   Smokeless tobacco: Never  Vaping Use   Vaping Use: Never used  Substance Use Topics   Alcohol use: No   Drug use: No     Allergies   Patient has no known allergies.   Review of Systems Review of Systems  Constitutional:  Positive for fever.  Respiratory:  Positive for cough.   All other systems reviewed and are negative.   Physical Exam Triage Vital Signs ED Triage Vitals [01/30/21 1738]  Enc Vitals Group     BP 121/85     Pulse Rate 79     Resp 18     Temp 98 F (36.7 C)     Temp Source Oral     SpO2 100 %     Weight      Height      Head Circumference      Peak Flow      Pain Score 0     Pain Loc      Pain Edu?      Excl. in GC?  No data found.  Updated Vital Signs BP 121/85 (BP Location: Left Arm)   Pulse 79   Temp 98 F (36.7 C) (Oral)   Resp 18   SpO2 100%      Physical Exam Vitals and nursing note reviewed.  Constitutional:      General: She is not in acute distress.    Appearance: Normal appearance. She is obese. She is not ill-appearing.  HENT:     Head: Normocephalic and atraumatic.     Right Ear: Tympanic membrane, ear canal and external ear normal.     Left Ear: Tympanic membrane, ear canal and external ear normal.     Mouth/Throat:     Mouth: Mucous membranes are moist.     Pharynx: Oropharynx is clear.     Comments: Moderate amount of clear drainage of posterior oropharynx noted. Eyes:     Extraocular Movements: Extraocular movements intact.     Conjunctiva/sclera: Conjunctivae normal.     Pupils: Pupils are equal, round, and reactive to light.  Cardiovascular:     Rate and Rhythm: Normal rate and regular rhythm.     Pulses: Normal pulses.     Heart sounds: Normal heart sounds.  Pulmonary:     Effort: Pulmonary effort is normal.     Breath sounds: Normal breath sounds. No wheezing, rhonchi or rales.  Musculoskeletal:         General: Normal range of motion.     Cervical back: Normal range of motion and neck supple.  Skin:    General: Skin is warm and dry.  Neurological:     General: No focal deficit present.     Mental Status: She is alert and oriented to person, place, and time.     UC Treatments / Results  Labs (all labs ordered are listed, but only abnormal results are displayed) Labs Reviewed  COVID-19, FLU A+B NAA    EKG   Radiology No results found.  Procedures Procedures (including critical care time)  Medications Ordered in UC Medications - No data to display  Initial Impression / Assessment and Plan / UC Course  I have reviewed the triage vital signs and the nursing notes.  Pertinent labs & imaging results that were available during my care of the patient were reviewed by me and considered in my medical decision making (see chart for details).     MDM: 1.  Cough-COVID-19 flu A/B ordered Rx'd Tessalon Perles; 2.  Allergic rhinitis-Rx'd Allegra; 3.  Headache disorder-advised OTC Excedrin 1-2 tabs daily. Advised patient to take Allegra daily for the next 5 to 7 days for concurrent postnasal drainage/drip.  Advised patient may take Tessalon Perles daily or as needed for cough.  Advised patient may use OTC extra strength Excedrin 1-2 tabs daily for headache.  Advised patient we will follow-up with COVID-19 flu A/B results once received.  Patient discharged home, hemodynamically stable. Final Clinical Impressions(s) / UC Diagnoses   Final diagnoses:  Cough, unspecified type  Allergic rhinitis, unspecified seasonality, unspecified trigger  Headache disorder     Discharge Instructions      Advised patient to take Allegra daily for the next 5 to 7 days for concurrent postnasal drainage/drip.  Advised patient may take Tessalon Perles daily or as needed for cough.  Advised patient may use OTC extra strength Excedrin 1-2 tabs daily for headache.  Advised patient we will follow-up with  COVID-19 flu A/B results once received.     ED Prescriptions     Medication  Sig Dispense Auth. Provider   benzonatate (TESSALON) 200 MG capsule Take 1 capsule (200 mg total) by mouth 3 (three) times daily as needed for up to 7 days for cough. 30 capsule Trevor Iha, FNP   fexofenadine [facility] ALLERGY) 180 MG tablet Take 1 tablet (180 mg total) by mouth daily for 15 days. 15 tablet Trevor Iha, FNP      PDMP not reviewed this encounter.   Trevor Iha, FNP 01/30/21 1830

## 2021-01-30 NOTE — Discharge Instructions (Addendum)
Advised patient to take Allegra daily for the next 5 to 7 days for concurrent postnasal drainage/drip.  Advised patient may take Tessalon Perles daily or as needed for cough.  Advised patient may use OTC extra strength Excedrin 1-2 tabs daily for headache.  Advised patient we will follow-up with COVID-19 flu A/B results once received.

## 2021-01-30 NOTE — ED Triage Notes (Signed)
Pt c/o cough and fever x 2 days. Nyquil prn.

## 2021-02-01 LAB — COVID-19, FLU A+B NAA
Influenza A, NAA: DETECTED — AB
Influenza B, NAA: NOT DETECTED
SARS-CoV-2, NAA: NOT DETECTED

## 2021-05-03 ENCOUNTER — Other Ambulatory Visit: Payer: Self-pay

## 2021-05-03 ENCOUNTER — Encounter (HOSPITAL_BASED_OUTPATIENT_CLINIC_OR_DEPARTMENT_OTHER): Payer: Self-pay

## 2021-05-03 ENCOUNTER — Emergency Department (HOSPITAL_BASED_OUTPATIENT_CLINIC_OR_DEPARTMENT_OTHER)
Admission: EM | Admit: 2021-05-03 | Discharge: 2021-05-03 | Disposition: A | Discharge status: Home / Self Care | Payer: No Typology Code available for payment source | Attending: Emergency Medicine | Admitting: Emergency Medicine

## 2021-05-03 DIAGNOSIS — Y92410 Unspecified street and highway as the place of occurrence of the external cause: Secondary | ICD-10-CM | POA: Diagnosis not present

## 2021-05-03 DIAGNOSIS — S199XXA Unspecified injury of neck, initial encounter: Secondary | ICD-10-CM | POA: Insufficient documentation

## 2021-05-03 DIAGNOSIS — S3992XA Unspecified injury of lower back, initial encounter: Secondary | ICD-10-CM | POA: Diagnosis not present

## 2021-05-03 DIAGNOSIS — S299XXA Unspecified injury of thorax, initial encounter: Secondary | ICD-10-CM | POA: Insufficient documentation

## 2021-05-03 DIAGNOSIS — S39012A Strain of muscle, fascia and tendon of lower back, initial encounter: Secondary | ICD-10-CM

## 2021-05-03 MED ORDER — CYCLOBENZAPRINE HCL 5 MG PO TABS
5.0000 mg | ORAL_TABLET | Freq: Once | ORAL | Status: AC
  Administered 2021-05-03: 5 mg via ORAL
  Filled 2021-05-03: qty 1

## 2021-05-03 MED ORDER — CYCLOBENZAPRINE HCL 5 MG PO TABS: 5.0000 mg | ORAL_TABLET | Freq: Three times a day (TID) | ORAL | 0 refills | Status: DC | PRN

## 2021-05-03 MED ORDER — IBUPROFEN 600 MG PO TABS: 600.0000 mg | ORAL_TABLET | Freq: Four times a day (QID) | ORAL | 0 refills | Status: DC | PRN

## 2021-05-03 MED ORDER — IBUPROFEN 800 MG PO TABS
800.0000 mg | ORAL_TABLET | Freq: Once | ORAL | Status: AC
  Administered 2021-05-03: 800 mg via ORAL
  Filled 2021-05-03: qty 1

## 2021-05-03 NOTE — ED Notes (Signed)
Rx x 2 given  Written and verbal inst to pt  Verbalized an understanding  To home with family 

## 2021-05-03 NOTE — ED Notes (Signed)
ED Provider at bedside. 
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
## 2021-06-21 ENCOUNTER — Ambulatory Visit: Payer: Medicaid Other | Admitting: Family Medicine

## 2022-03-19 NOTE — L&D Delivery Note (Cosign Needed Addendum)
OB/GYN Faculty Practice Delivery Note  Rhonda Fischer is a 32 y.o. [id number] s/p SVD at [redacted]w[redacted]d. She was admitted for SOL.   ROM: 2h 39m with Clear fluid, terminal meconium during delivery GBS Status: Negative/-- (10/03 1039) Maximum Maternal Temperature: 98.2 F  Labor Progress: Patient presented to L&D for SOL. Initial SVE: 5/80%/-2. AROM was performed and pitocin started. She then quickly progressed to complete.  Delivery Date/Time: 10/18 [phone] Delivery: Called to room and patient was 9.5 cm with lip, quickly progressed to complete after brief pushing break and resumed pushing. Head delivered LOA and restituted to patient's right. No nuchal cord present. Shoulder and body delivered in usual fashion. Infant with spontaneous cry, placed on mother's abdomen, dried and stimulated. Cord clamped x 2 after 1-minute delay, and cut by FOB. Cord blood drawn. Placenta delivered spontaneously with gentle cord traction. Fundus firm with massage and Pitocin. Labia, perineum, vagina, and cervix inspected with no lacerations noted. Placenta was examined after and found to be intact with normal 3-vessel cord.  Placenta:Intact Complications: None Lacerations: None EBL: 110 mL Analgesia: None  Postpartum Planning -Mom to postpartum, baby skin-to-skin  [x]  message to sent to schedule follow-up  [x]  vaccines UTD  Infant: APGARs 9/9  pending weight  Dimitry Shitarev [facility] PGY-1 01/04/23 8:58 AM  Evaluation and management procedures were performed by the [facility] Medicine Resident under my supervision. I was immediately available for direct supervision, assistance and direction throughout this encounter.  I also confirm that I have verified the information documented in the resident's note, and that I have also personally reperformed the pertinent components of the physical exam and all of the medical decision making activities.  I have also made any necessary editorial changes.   Mittie Bodo,  MD Family Medicine - Obstetrics Fellow

## 2022-05-22 ENCOUNTER — Encounter: Payer: Self-pay | Admitting: General Practice

## 2022-06-08 ENCOUNTER — Encounter (HOSPITAL_COMMUNITY): Payer: Self-pay | Admitting: Obstetrics & Gynecology

## 2022-06-08 ENCOUNTER — Other Ambulatory Visit (HOSPITAL_BASED_OUTPATIENT_CLINIC_OR_DEPARTMENT_OTHER): Payer: Self-pay

## 2022-06-08 ENCOUNTER — Inpatient Hospital Stay (HOSPITAL_COMMUNITY): Payer: 59

## 2022-06-08 ENCOUNTER — Encounter: Payer: Self-pay | Admitting: Emergency Medicine

## 2022-06-08 ENCOUNTER — Inpatient Hospital Stay (HOSPITAL_COMMUNITY)
Admission: AD | Admit: 2022-06-08 | Discharge: 2022-06-08 | Disposition: A | Discharge status: Home / Self Care | Payer: 59 | Attending: Obstetrics & Gynecology | Admitting: Obstetrics & Gynecology

## 2022-06-08 ENCOUNTER — Ambulatory Visit
Admission: EM | Admit: 2022-06-08 | Discharge: 2022-06-08 | Disposition: A | Discharge status: Home / Self Care | Payer: 59 | Attending: Family Medicine | Admitting: Family Medicine

## 2022-06-08 DIAGNOSIS — R1032 Left lower quadrant pain: Secondary | ICD-10-CM | POA: Diagnosis not present

## 2022-06-08 DIAGNOSIS — O219 Vomiting of pregnancy, unspecified: Secondary | ICD-10-CM

## 2022-06-08 DIAGNOSIS — R110 Nausea: Secondary | ICD-10-CM | POA: Diagnosis not present

## 2022-06-08 DIAGNOSIS — Z3201 Encounter for pregnancy test, result positive: Secondary | ICD-10-CM

## 2022-06-08 DIAGNOSIS — R109 Unspecified abdominal pain: Secondary | ICD-10-CM

## 2022-06-08 DIAGNOSIS — Z3A09 9 weeks gestation of pregnancy: Secondary | ICD-10-CM | POA: Insufficient documentation

## 2022-06-08 DIAGNOSIS — O26851 Spotting complicating pregnancy, first trimester: Secondary | ICD-10-CM | POA: Diagnosis not present

## 2022-06-08 DIAGNOSIS — O26891 Other specified pregnancy related conditions, first trimester: Secondary | ICD-10-CM | POA: Insufficient documentation

## 2022-06-08 DIAGNOSIS — O99611 Diseases of the digestive system complicating pregnancy, first trimester: Secondary | ICD-10-CM | POA: Diagnosis not present

## 2022-06-08 DIAGNOSIS — Z3491 Encounter for supervision of normal pregnancy, unspecified, first trimester: Secondary | ICD-10-CM

## 2022-06-08 LAB — CBC
HCT: 40.9 % (ref 36.0–46.0)
Hemoglobin: 13.4 g/dL (ref 12.0–15.0)
MCH: 25.8 pg — ABNORMAL LOW (ref 26.0–34.0)
MCHC: 32.8 g/dL (ref 30.0–36.0)
MCV: 78.7 fL — ABNORMAL LOW (ref 80.0–100.0)
Platelets: 200 10*3/uL (ref 150–400)
RBC: 5.2 MIL/uL — ABNORMAL HIGH (ref 3.87–5.11)
RDW: 12.6 % (ref 11.5–15.5)
WBC: 9.6 10*3/uL (ref 4.0–10.5)
nRBC: 0 % (ref 0.0–0.2)

## 2022-06-08 LAB — URINALYSIS, ROUTINE W REFLEX MICROSCOPIC
Bilirubin Urine: NEGATIVE
Glucose, UA: NEGATIVE mg/dL
Hgb urine dipstick: NEGATIVE
Ketones, ur: NEGATIVE mg/dL
Nitrite: NEGATIVE
Protein, ur: NEGATIVE mg/dL
Specific Gravity, Urine: 1.013 (ref 1.005–1.030)
pH: 6 (ref 5.0–8.0)

## 2022-06-08 LAB — POCT URINE PREGNANCY: Preg Test, Ur: POSITIVE — AB

## 2022-06-08 LAB — HCG, QUANTITATIVE, PREGNANCY: hCG, Beta Chain, Quant, S: 188709 m[IU]/mL — ABNORMAL HIGH (ref <5)

## 2022-06-08 MED ORDER — FAMOTIDINE 20 MG PO TABS: 20.0000 mg | ORAL_TABLET | Freq: Every day | ORAL | 0 refills | Status: AC

## 2022-06-08 MED ORDER — ONDANSETRON 8 MG PO TBDP
8.0000 mg | ORAL_TABLET | Freq: Once | ORAL | Status: AC
  Administered 2022-06-08: 8 mg via ORAL

## 2022-06-08 MED ORDER — GLYCOPYRROLATE 1 MG PO TABS: 1.0000 mg | ORAL_TABLET | Freq: Three times a day (TID) | ORAL | 2 refills | Status: DC | PRN

## 2022-06-08 MED ORDER — ONDANSETRON 8 MG PO TBDP
8.0000 mg | ORAL_TABLET | Freq: Three times a day (TID) | ORAL | 0 refills | Status: DC | PRN
  Filled 2022-06-08: qty 24, 8d supply, fill #0

## 2022-06-08 MED ORDER — METOCLOPRAMIDE HCL 10 MG PO TABS: 10.0000 mg | ORAL_TABLET | Freq: Three times a day (TID) | ORAL | 2 refills | Status: DC | PRN

## 2022-06-08 NOTE — MAU Note (Signed)
...  Rhonda Fischer is a 32 y.o. at Unknown here in MAU reporting: Left lower abdominal cramping that began three weeks ago. She reports she the pain is sporadic. She reports the pains occurs mostly at night if she is lying down. Denies VB. Denies vaginal discharge, vaginal odors, and vaginal itching.   LMP: unsure Onset of complaint: x3 weeks  Pain score:  6/10 left lower abdominal pain - none currently  Lab orders placed from triage: none

## 2022-06-08 NOTE — Discharge Instructions (Addendum)
Patient aware of positive urine pregnancy test today reports was positive on home urine pregnancy 3 weeks ago.  Advised patient may use Zofran daily or as needed for nausea.  Advised patient if abdominal cramping worsens please go to [facility] immediately for further evaluation.

## 2022-06-08 NOTE — ED Triage Notes (Signed)
Pt reports LLQ abdominal pain and nausea and vomiting for a few weeks. States she's pregnant. Unsure of LMP.

## 2022-06-08 NOTE — MAU Provider Note (Addendum)
History    Chief Complaint  Patient presents with   Nausea   Ptyalism   Abdominal Pain   Rhonda Fischer is a 32yo G4P3 at an estimated gestational age of [redacted]w[redacted]d based on US done today presents to MAU with abdominal pain. She reports LLQ abdominal pain for the last 3 weeks. This pain has not changed in intensity over this time; it doesn't seem to be getting Fischer or worsening. She has not had similar cramps with prior pregnancies. She reports drinking one bottle of water a day; I am unsure of additional fluid intake. She reports having one BM every morning.  She reports N/V throughout this pregnancy, with a couple episodes of vomiting a day and occasional blood spotting in her vomit. No vomiting frank blood. She denies vaginal bleeding, leakage of fluids. She reports feeling a little bit of fetal movement.  Abdominal Pain Associated symptoms include nausea and vomiting. Pertinent negatives include no constipation.    OB History     Gravida  4   Para  3   Term  3   Preterm  0   AB  0   Living  3      SAB  0   IAB  0   Ectopic  0   Multiple      Live Births  3           Past Medical History:  Diagnosis Date   GERD (gastroesophageal reflux disease)     Past Surgical History:  Procedure Laterality Date   NO PAST SURGERIES      No family history on file.  Social History   Tobacco Use   Smoking status: Never   Smokeless tobacco: Never  Vaping Use   Vaping Use: Never used  Substance Use Topics   Alcohol use: No   Drug use: No    Allergies: No Known Allergies  Medications Prior to Admission  Medication Sig Dispense Refill Last Dose   cyclobenzaprine (FLEXERIL) 5 MG tablet Take 1 tablet (5 mg total) by mouth 3 (three) times daily as needed. 10 tablet 0    fexofenadine (ALLEGRA ALLERGY) 180 MG tablet Take 1 tablet (180 mg total) by mouth daily for 15 days. 15 tablet 0    ibuprofen (ADVIL) 600 MG tablet Take 1 tablet (600 mg total) by mouth every 6  (six) hours as needed. 30 tablet 0    omeprazole (PRILOSEC) 20 MG capsule Take 1 capsule (20 mg total) by mouth daily. 30 capsule 3    ondansetron (ZOFRAN-ODT) 8 MG disintegrating tablet Take 1 tablet (8 mg total) by mouth every 8 (eight) hours as needed for nausea or vomiting. 24 tablet 0     Review of Systems  Gastrointestinal:  Positive for abdominal pain, nausea and vomiting. Negative for constipation.  Genitourinary:  Negative for vaginal bleeding.   Physical Exam Blood pressure 117/72, pulse 85, temperature 99.2 F (37.3 C), temperature source Oral, resp. rate 15, height 5\' 8"  (1.727 m), weight 70.3 kg, last menstrual period 03/19/2022, SpO2 100 %, unknown if currently breastfeeding. Physical Exam Constitutional:      General: She is not in acute distress. Pulmonary:     Effort: Pulmonary effort is normal.  Abdominal:     Palpations: Abdomen is soft.     Tenderness: There is abdominal tenderness in the left lower quadrant.  Neurological:     Mental Status: She is alert and oriented to person, place, and time.  Psychiatric:  Mood and Affect: Mood normal.     MAU Course Procedures Results for orders placed or performed during the hospital encounter of 06/08/22 (from the past 24 hour(s))  CBC     Status: Abnormal   Collection Time: 06/08/22  5:07 PM  Result Value Ref Range   WBC 9.6 4.0 - 10.5 K/uL   RBC 5.20 (H) 3.87 - 5.11 MIL/uL   Hemoglobin 13.4 12.0 - 15.0 g/dL   HCT 40.9 36.0 - 46.0 %   MCV 78.7 (L) 80.0 - 100.0 fL   MCH 25.8 (L) 26.0 - 34.0 pg   MCHC 32.8 30.0 - 36.0 g/dL   RDW 12.6 11.5 - 15.5 %   Platelets 200 150 - 400 K/uL   nRBC 0.0 0.0 - 0.2 %  hCG, quantitative, pregnancy     Status: Abnormal   Collection Time: 06/08/22  5:07 PM  Result Value Ref Range   hCG, Beta Chain, Quant, S 188,709 (H) <5 mIU/mL  Urinalysis, Routine w reflex microscopic -Urine, Clean Catch     Status: Abnormal   Collection Time: 06/08/22  5:51 PM  Result Value Ref Range    Color, Urine YELLOW YELLOW   APPearance HAZY (A) CLEAR   Specific Gravity, Urine 1.013 1.005 - 1.030   pH 6.0 5.0 - 8.0   Glucose, UA NEGATIVE NEGATIVE mg/dL   Hgb urine dipstick NEGATIVE NEGATIVE   Bilirubin Urine NEGATIVE NEGATIVE   Ketones, ur NEGATIVE NEGATIVE mg/dL   Protein, ur NEGATIVE NEGATIVE mg/dL   Nitrite NEGATIVE NEGATIVE   Leukocytes,Ua TRACE (A) NEGATIVE   Sperm, UA PRESENT    RBC / HPF 0-5 0 - 5 RBC/hpf   WBC, UA 0-5 0 - 5 WBC/hpf   Bacteria, UA RARE (A) NONE SEEN   Squamous Epithelial / HPF 11-20 0 - 5 /HPF   Mucus PRESENT    US OB Comp Less 14 Wks  Result Date: 06/08/2022 CLINICAL DATA:  Left lower quadrant abdominal pain. EXAM: OBSTETRIC <14 WK ULTRASOUND TECHNIQUE: Transabdominal ultrasound was performed for evaluation of the gestation as well as the maternal uterus and adnexal regions. COMPARISON:  None Available. FINDINGS: Intrauterine gestational sac: Single Yolk sac:  Visualized. Embryo:  Visualized. Cardiac Activity: Visualized. Heart Rate: 173 bpm CRL:   22.5 mm   9 w 0 d                  [place] EDC: January 11, 2023. Subchorionic hemorrhage:  None visualized. Maternal uterus/adnexae: Right ovary is unremarkable. No free fluid is noted. 3.4 cm left ovarian cyst. IMPRESSION: Single live intrauterine gestation of 9 weeks 0 days. Electronically Signed   By: Marijo Conception M.D.   On: 06/08/2022 17:43    MDM Abdominal pain Low concern for infection given largely benign urinalysis. Blood count also normal. US shows single live intrauterine gestation along with a 3.4 left ovarian cyst. Suspect cyst contributing to symptoms given location; pt reassured Could also be component of dehydration; pt encouraged to maintain adequate fluids. At pt's request, acid reflux medication ordered Nausea/Vomiting No concern for hyperemesis gravidarum given pt's stable vitals and lack of excessive vomiting Likely first trimester morning sickness; pt prescribed Zofran to manage G4P3 at  [redacted]w[redacted]d  Amanda Nemecek, Medical Student 06/08/2022, 7:13 PM     Attestation of Supervision of Student:  I confirm that I have verified the information documented in the medical student's note and that I have also personally performed the history, physical exam and all medical decision making  activities.  I have verified that all services and findings are accurately documented in this student's note; and I agree with management and plan as outlined in the documentation. I have also made any necessary editorial changes.  History Rhonda Fischer is a 32 y.o. G4P3003 at [redacted]w[redacted]d who presents for LLQ pain. Pain has been going on for a few weeks. Intermittent pain that is worse in the evening. Also has had spitting & n/v. Denies fever, diarrhea, constipation, vaginal bleeding, or vaginal discharge.   Physical exam BP 117/72 (BP Location: Right Arm)   Pulse 85   Temp 99.2 F (37.3 C) (Oral)   Resp 15   Ht 5\' 8"  (1.727 m)   Wt 70.3 kg   LMP 03/19/2022 (Approximate)   SpO2 100%   BMI 23.57 kg/m   Physical Examination: General appearance - alert, well appearing, and in no distress Mental status - normal mood, behavior, speech, dress, motor activity, and thought processes Eyes - pupils equal and reactive, extraocular eye movements intact, sclera anicteric Chest - normal respiratory effort Abdomen - soft, nontender, nondistended, no masses or organomegaly  MDM +UPT UA, CBC, ABO/Rh, quant hCG, and [place] today to rule out ectopic pregnancy which can be life threatening.    Assessment/Plan 1. Abdominal pain during pregnancy in first trimester  -Live IUP on ultrasound. Pain r/t left ovarian cyst vs normal pregnancy changes since this is her fourth pregnancy. Reviewed reasons to return to MAU  2. Normal IUP (intrauterine pregnancy) on prenatal ultrasound, first trimester   3. Nausea and vomiting during pregnancy prior to [redacted] weeks gestation  -Rx reglan, pepcid, & robinul  4. [redacted] weeks gestation  of pregnancy      Jorje Guild, NP 06/08/2022 7:23 PM

## 2022-06-08 NOTE — ED Provider Notes (Signed)
[facility]    CSN: [id number] Arrival date & time: 06/08/22  1243      History   Chief Complaint Chief Complaint  Patient presents with   Abdominal Pain   Nausea    HPI Rhonda Fischer is a 32 y.o. female.   HPI Pleasant 32 year old female presents with abdominal cramping, nausea, vomiting for 4 days.  Patient is accompanied by her husband and daughter this afternoon.  Reports is currently pregnant and is scheduled to follow-up with GYN on 06/21/2022.  Patient reports she was positive on urine pregnancy test at home 3 weeks ago.  Past Medical History:  Diagnosis Date   GERD (gastroesophageal reflux disease)     Patient Active Problem List   Diagnosis Date Noted   SVD (spontaneous vaginal delivery) 07/11/2019   Indication for care or intervention in labor or delivery 07/09/2019   Non-reactive NST (non-stress test) 07/09/2019   Group beta Strep positive 06/15/2019   Pneumonia due to COVID-19 virus 04/29/2019   Supervision of low-risk pregnancy 12/29/2018   Language barrier 05/08/2012    Past Surgical History:  Procedure Laterality Date   NO PAST SURGERIES      OB History     Gravida  3   Para  3   Term  3   Preterm  0   AB  0   Living  3      SAB  0   IAB  0   Ectopic  0   Multiple      Live Births  3            Home Medications    Prior to Admission medications   Medication Sig Start Date End Date Taking? Authorizing Provider  ondansetron (ZOFRAN-ODT) 8 MG disintegrating tablet Take 1 tablet (8 mg total) by mouth every 8 (eight) hours as needed for nausea or vomiting. 06/08/22  Yes Eliezer Lofts, FNP  cyclobenzaprine (FLEXERIL) 5 MG tablet Take 1 tablet (5 mg total) by mouth 3 (three) times daily as needed. 05/03/21   Drenda Freeze, MD  fexofenadine [facility] ALLERGY) 180 MG tablet Take 1 tablet (180 mg total) by mouth daily for 15 days. 01/30/21 02/14/21  Eliezer Lofts, FNP  ibuprofen (ADVIL) 600 MG tablet Take  1 tablet (600 mg total) by mouth every 6 (six) hours as needed. 05/03/21   Drenda Freeze, MD  omeprazole (PRILOSEC) 20 MG capsule Take 1 capsule (20 mg total) by mouth daily. 04/16/19   Emily Filbert, MD    Family History History reviewed. No pertinent family history.  Social History Social History   Tobacco Use   Smoking status: Never   Smokeless tobacco: Never  Vaping Use   Vaping Use: Never used  Substance Use Topics   Alcohol use: No   Drug use: No     Allergies   Patient has no known allergies.   Review of Systems Review of Systems  Gastrointestinal:  Positive for nausea and vomiting.       Abdominal cramping  All other systems reviewed and are negative.    Physical Exam Triage Vital Signs ED Triage Vitals  Enc Vitals Group     BP      Pulse      Resp      Temp      Temp src      SpO2      Weight      Height      Head Circumference  Peak Flow      Pain Score      Pain Loc      Pain Edu?      Excl. in [place]?    No data found.  Updated Vital Signs BP 105/72 (BP Location: Right Arm)   Pulse 93   Temp 98.7 F (37.1 C) (Oral)   Resp 18   SpO2 100%     Physical Exam Vitals and nursing note reviewed.  Constitutional:      Appearance: Normal appearance. She is normal weight.  HENT:     Head: Normocephalic and atraumatic.     Mouth/Throat:     Mouth: Mucous membranes are moist.     Pharynx: Oropharynx is clear.  Eyes:     Extraocular Movements: Extraocular movements intact.     Conjunctiva/sclera: Conjunctivae normal.     Pupils: Pupils are equal, round, and reactive to light.  Cardiovascular:     Rate and Rhythm: Normal rate and regular rhythm.     Pulses: Normal pulses.     Heart sounds: Normal heart sounds. No murmur heard. Pulmonary:     Effort: Pulmonary effort is normal.     Breath sounds: Normal breath sounds. No wheezing, rhonchi or rales.  Abdominal:     General: Abdomen is flat. Bowel sounds are normal. There is no  distension or abdominal bruit.     Palpations: Abdomen is soft.     Tenderness: There is abdominal tenderness in the left lower quadrant. There is no right CVA tenderness, left CVA tenderness, guarding or rebound. Negative signs include Murphy's sign, Rovsing's sign and McBurney's sign.     Hernia: No hernia is present.     Comments: Patient describes mild intermittent cramping of left lower quadrant  Musculoskeletal:        General: Normal range of motion.     Cervical back: Normal range of motion and neck supple.  Skin:    General: Skin is warm and dry.  Neurological:     General: No focal deficit present.     Mental Status: She is alert and oriented to person, place, and time. Mental status is at baseline.      UC Treatments / Results  Labs (all labs ordered are listed, but only abnormal results are displayed) Labs Reviewed  POCT URINE PREGNANCY - Abnormal; Notable for the following components:      Result Value   Preg Test, Ur Positive (*)    All other components within normal limits    EKG   Radiology No results found.  Procedures Procedures (including critical care time)  Medications Ordered in UC Medications  ondansetron (ZOFRAN-ODT) disintegrating tablet 8 mg (8 mg Oral Given 06/08/22 1307)    Initial Impression / Assessment and Plan / UC Course  I have reviewed the triage vital signs and the nursing notes.  Pertinent labs & imaging results that were available during my care of the patient were reviewed by me and considered in my medical decision making (see chart for details).     MDM: 1.  Abdominal cramping-patient reports positive home pregnancy test 3 weeks ago confirmed today reports similar left lower quadrant abdominal cramping with 3 previous pregnancies patient is scheduled for first GYN follow-up with this pregnancy on 07/04/2022, advised patient if cramping worsens should go to [facility] in [place] immediately for further  evaluation; 2.  Positive urine pregnancy test-confirming patient's previous home test; 3.  Nausea-Zofran 8 mg given once in clinic and  prior to discharge, Rx'd Zofran 8 mg 3 times daily, as needed for nausea.  Work note provided to patient per request prior to discharge for Saturday and Sunday. Patient aware of positive urine pregnancy test today reports was positive on home urine pregnancy 3 weeks ago.  Advised patient may use Zofran daily or as needed for nausea.  Advised patient if abdominal cramping worsens please go to [facility] immediately for further evaluation.  Patient discharged home, hemodynamically stable. Final Clinical Impressions(s) / UC Diagnoses   Final diagnoses:  Abdominal cramping  Positive urine pregnancy test  Nausea     Discharge Instructions      Patient aware of positive urine pregnancy test today reports was positive on home urine pregnancy 3 weeks ago.  Advised patient may use Zofran daily or as needed for nausea.  Advised patient if abdominal cramping worsens please go to [facility] immediately for further evaluation.     ED Prescriptions     Medication Sig Dispense Auth. Provider   ondansetron (ZOFRAN-ODT) 8 MG disintegrating tablet Take 1 tablet (8 mg total) by mouth every 8 (eight) hours as needed for nausea or vomiting. 24 tablet Eliezer Lofts, FNP      PDMP not reviewed this encounter.   Eliezer Lofts, [place] 06/08/22 1355

## 2022-06-09 ENCOUNTER — Telehealth: Payer: Self-pay

## 2022-06-09 NOTE — Telephone Encounter (Signed)
Pt states feeling better since UC and womens visit. Advised to call if any questions or concerns.

## 2022-07-04 ENCOUNTER — Encounter: Payer: Medicaid Other | Admitting: Family Medicine

## 2022-07-05 ENCOUNTER — Other Ambulatory Visit: Payer: Self-pay | Admitting: Student

## 2022-07-12 ENCOUNTER — Ambulatory Visit: Payer: 59 | Admitting: Family Medicine

## 2022-07-12 ENCOUNTER — Encounter: Payer: Self-pay | Admitting: Family Medicine

## 2022-07-12 ENCOUNTER — Other Ambulatory Visit (INDEPENDENT_AMBULATORY_CARE_PROVIDER_SITE_OTHER): Payer: 59

## 2022-07-12 ENCOUNTER — Other Ambulatory Visit (HOSPITAL_COMMUNITY)
Admission: RE | Admit: 2022-07-12 | Discharge: 2022-07-12 | Disposition: A | Discharge status: Home / Self Care | Payer: 59 | Source: Ambulatory Visit | Attending: Family Medicine | Admitting: Family Medicine

## 2022-07-12 VITALS — BP 112/67 | HR 83 | Wt 153.0 lb

## 2022-07-12 DIAGNOSIS — Z3A13 13 weeks gestation of pregnancy: Secondary | ICD-10-CM

## 2022-07-12 DIAGNOSIS — Z3480 Encounter for supervision of other normal pregnancy, unspecified trimester: Secondary | ICD-10-CM | POA: Insufficient documentation

## 2022-07-12 DIAGNOSIS — Z3481 Encounter for supervision of other normal pregnancy, first trimester: Secondary | ICD-10-CM | POA: Diagnosis not present

## 2022-07-12 DIAGNOSIS — O3680X0 Pregnancy with inconclusive fetal viability, not applicable or unspecified: Secondary | ICD-10-CM

## 2022-07-12 NOTE — Progress Notes (Signed)
Subjective:  Rhonda Fischer is a [id number] [redacted]w[redacted]d c/w [place] today, being seen today for her first obstetrical visit.  Her obstetrical history is significant for  3 prior uncomplicated vaginal deliveries. No medical problems . Patient does intend to breast feed. Pregnancy history fully reviewed.  Patient reports no complaints.  BP 112/67   Pulse 83   Wt 153 lb (69.4 kg)   LMP 03/19/2022 (Approximate)   BMI 23.26 kg/m   HISTORY: OB History  Gravida Para Term Preterm AB Living  0 0 3  SAB IAB Ectopic Multiple Live Births  0 0 0   3    # Outcome Date GA Lbr Len/2nd Weight Sex Delivery Anes PTL Lv  4 Current           3 Term 07/09/19 [redacted]w[redacted]d 03:40 / 00:05 8 lb 0.9 oz (3.654 kg) F Vag-Spont None  LIV  2 Term 02/18/15 [redacted]w[redacted]d 13:28 / 00:04 7 lb 6 oz (3.345 kg) M Vag-Spont None  LIV  1 Term 10/23/12 [redacted]w[redacted]d 08:13 / 01:50 6 lb 7.5 oz (2.935 kg) F Vag-Spont EPI  LIV    Past Medical History:  Diagnosis Date   GERD (gastroesophageal reflux disease)     Past Surgical History:  Procedure Laterality Date   NO PAST SURGERIES      Family History  Problem Relation Age of Onset   Cancer Neg Hx    Hypertension Neg Hx      Exam  BP 112/67   Pulse 83   Wt 153 lb (69.4 kg)   LMP 03/19/2022 (Approximate)   BMI 23.26 kg/m   Chaperone present during exam  CONSTITUTIONAL: Well-developed, well-nourished female in no acute distress.  HENT:  Normocephalic, atraumatic, External right and left ear normal. Oropharynx is clear and moist EYES: Conjunctivae and EOM are normal. Pupils are equal, round, and reactive to light. No scleral icterus.  NECK: Normal range of motion, supple, no masses.  Normal thyroid.  CARDIOVASCULAR: Normal heart rate noted, regular rhythm RESPIRATORY: Clear to auscultation bilaterally. Effort and breath sounds normal, no problems with respiration noted. BREASTS: Symmetric in size. No masses, skin changes, nipple drainage, or lymphadenopathy. ABDOMEN: Soft,  normal bowel sounds, no distention noted.  No tenderness, rebound or guarding.  PELVIC: Normal appearing external genitalia; normal appearing vaginal mucosa and cervix. No abnormal discharge noted. Normal uterine size, no other palpable masses, no uterine or adnexal tenderness. MUSCULOSKELETAL: Normal range of motion. No tenderness.  No cyanosis, clubbing, or edema.  2+ distal pulses. SKIN: Skin is warm and dry. No rash noted. Not diaphoretic. No erythema. No pallor. NEUROLOGIC: Alert and oriented to person, place, and time. Normal reflexes, muscle tone coordination. No cranial nerve deficit noted. PSYCHIATRIC: Normal mood and affect. Normal behavior. Normal judgment and thought content.    Assessment:    Pregnancy: [id number] Patient Active Problem List   Diagnosis Date Noted   Supervision of other normal pregnancy, antepartum 07/12/2022   Pneumonia due to COVID-19 virus 04/29/2019   Language barrier 05/08/2012      Plan:   1. Pregnancy with uncertain fetal viability, single or unspecified fetus - US OB Limited; Future  2. Supervision of other normal pregnancy, antepartum  3. [redacted] weeks gestation of pregnancy   Initial labs obtained Continue prenatal vitamins Reviewed n/v relief measures and warning s/s to report Reviewed recommended weight gain based on pre-gravid BMI Encouraged well-balanced diet Genetic & carrier screening discussed: requests Panorama,  Ultrasound discussed; fetal survey: ordered CCNC completed>  form faxed if has or is planning to apply for medicaid The nature of [facility] [employer] [facility] with multiple MDs and other Advanced Practice Providers was explained to patient; also emphasized that fellows, residents, and students are part of our team.   Indications for ASA therapy (per uptodate) One of the following: Previous pregnancy with preeclampsia, especially early onset and with an adverse outcome No Multifetal gestation No Chronic  hypertension No Type 1 or 2 diabetes mellitus No Chronic kidney disease No Autoimmune disease (antiphospholipid syndrome, systemic lupus erythematosus) No  Two or more of the following: Nulliparity No Obesity (body mass index >30 kg/m2) No Family history of preeclampsia in mother or sister No Age ?35 years No Sociodemographic characteristics (African American race, low socioeconomic level) Yes Personal risk factors (eg, previous pregnancy with low birth weight or small for gestational age infant, previous adverse pregnancy outcome [eg, stillbirth], interval >10 years between pregnancies) No  Indications for early GDM screening  First-degree relative with diabetes No BMI >30kg/m2 No Age > 35 No Previous birth of an infant weighing ?4000 g No Gestational diabetes mellitus in a previous pregnancy No Glycated hemoglobin ?5.7 percent (39 mmol/mol), impaired glucose tolerance, or impaired fasting glucose on previous testing No High-risk race/ethnicity (eg, African American, Latino, Native American, [place] American, Pacific Islander) Yes Previous stillbirth of unknown cause No Maternal birthweight > 9 lbs No History of cardiovascular disease No Hypertension or on therapy for hypertension No High-density lipoprotein cholesterol level <35 mg/dL (3.24 mmol/L) and/or a triglyceride level >250 mg/dL (4.01 mmol/L) No Polycystic ovary syndrome No Physical inactivity No Other clinical condition associated with insulin resistance (eg, severe obesity, acanthosis nigricans) No Current use of glucocorticoids No   Early screening tests: none   Problem list reviewed and updated. 75% of 30 min visit spent on counseling and coordination of care.     Levie Heritage 07/12/2022

## 2022-07-13 LAB — CBC/D/PLT+RPR+RH+ABO+RUBIGG...
Antibody Screen: NEGATIVE
Basophils Absolute: 0 10*3/uL (ref 0.0–0.2)
Basos: 0 %
EOS (ABSOLUTE): 0.2 10*3/uL (ref 0.0–0.4)
Eos: 2 %
HCV Ab: NONREACTIVE
HIV Screen 4th Generation wRfx: NONREACTIVE
Hematocrit: 43.6 % (ref 34.0–46.6)
Hemoglobin: 14 g/dL (ref 11.1–15.9)
Hepatitis B Surface Ag: NEGATIVE
Immature Grans (Abs): 0.1 10*3/uL (ref 0.0–0.1)
Immature Granulocytes: 1 %
Lymphocytes Absolute: 1.5 10*3/uL (ref 0.7–3.1)
Lymphs: 17 %
MCH: 26 pg — ABNORMAL LOW (ref 26.6–33.0)
MCHC: 32.1 g/dL (ref 31.5–35.7)
MCV: 81 fL (ref 79–97)
Monocytes Absolute: 0.5 10*3/uL (ref 0.1–0.9)
Monocytes: 6 %
Neutrophils Absolute: 6.3 10*3/uL (ref 1.4–7.0)
Neutrophils: 74 %
Platelets: 217 10*3/uL (ref 150–450)
RBC: 5.39 x10E6/uL — ABNORMAL HIGH (ref 3.77–5.28)
RDW: 13.6 % (ref 11.7–15.4)
RPR Ser Ql: NONREACTIVE
Rh Factor: POSITIVE
Rubella Antibodies, IGG: 32.4 index (ref >0.99)
WBC: 8.6 10*3/uL (ref 3.4–10.8)

## 2022-07-13 LAB — HEPATITIS C ANTIBODY

## 2022-07-13 LAB — HCV INTERPRETATION

## 2022-07-14 LAB — URINE CULTURE

## 2022-07-17 LAB — CYTOLOGY - PAP
Chlamydia: NEGATIVE
Comment: NEGATIVE
Comment: NEGATIVE
Comment: NORMAL
Diagnosis: NEGATIVE
Diagnosis: REACTIVE
High risk HPV: NEGATIVE
Neisseria Gonorrhea: NEGATIVE

## 2022-07-20 LAB — PANORAMA PRENATAL TEST FULL PANEL:PANORAMA TEST PLUS 5 ADDITIONAL MICRODELETIONS: FETAL FRACTION: 7.5

## 2022-07-23 ENCOUNTER — Telehealth: Payer: Self-pay

## 2022-07-23 LAB — HORIZON 4 (SMA, CF, FRAGILE X, DMD)
CYSTIC FIBROSIS: NEGATIVE
DUCHENNE/BECKER MUSCULAR DYSTROPHY: NEGATIVE
FRAGILE X SYNDROME: NEGATIVE
REPORT SUMMARY: NEGATIVE
SPINAL MUSCULAR ATROPHY: NEGATIVE

## 2022-07-23 NOTE — Telephone Encounter (Signed)
Patient called and requested the gender of baby from [place]. Patient given results of panorama and made aware female baby. Armandina Stammer RN

## 2022-08-09 ENCOUNTER — Ambulatory Visit (INDEPENDENT_AMBULATORY_CARE_PROVIDER_SITE_OTHER): Payer: 59 | Admitting: Family Medicine

## 2022-08-09 VITALS — BP 99/65 | HR 87 | Wt 156.0 lb

## 2022-08-09 DIAGNOSIS — Z3A17 17 weeks gestation of pregnancy: Secondary | ICD-10-CM | POA: Diagnosis not present

## 2022-08-09 DIAGNOSIS — Z3480 Encounter for supervision of other normal pregnancy, unspecified trimester: Secondary | ICD-10-CM

## 2022-08-09 NOTE — Progress Notes (Signed)
   PRENATAL VISIT NOTE  Subjective:  Rhonda Fischer is a 32 y.o. G4P3003 at [redacted]w[redacted]d being seen today for ongoing prenatal care.  She is currently monitored for the following issues for this low-risk pregnancy and has Language barrier; Pneumonia due to COVID-19 virus; and Supervision of other normal pregnancy, antepartum on their problem list.  Patient reports no complaints.  Contractions: Not present. Vag. Bleeding: None.  Movement: Absent. Denies leaking of fluid.   The following portions of the patient's history were reviewed and updated as appropriate: allergies, current medications, past family history, past medical history, past social history, past surgical history and problem list.   Objective:   Vitals:   08/09/22 0910  BP: 99/65  Pulse: 87  Weight: 156 lb (70.8 kg)    Fetal Status: Fetal Heart Rate (bpm): 162   Movement: Absent     General:  Alert, oriented and cooperative. Patient is in no acute distress.  Skin: Skin is warm and dry. No rash noted.   Cardiovascular: Normal heart rate noted  Respiratory: Normal respiratory effort, no problems with respiration noted  Abdomen: Soft, gravid, appropriate for gestational age.  Pain/Pressure: Absent     Pelvic: Cervical exam deferred        Extremities: Normal range of motion.  Edema: None  Mental Status: Normal mood and affect. Normal behavior. Normal judgment and thought content.   Assessment and Plan:  Pregnancy: G4P3003 at [redacted]w[redacted]d 1. Supervision of other normal pregnancy, antepartum FHT and FH normal Eats a pinch of salt occasionally to help with nausea. No edema. Drinks plenty of water. - AFP, Serum, Open Spina Bifida  2. [redacted] weeks gestation of pregnancy - AFP, Serum, Open Spina Bifida  Preterm labor symptoms and general obstetric precautions including but not limited to vaginal bleeding, contractions, leaking of fluid and fetal movement were reviewed in detail with the patient. Please refer to After Visit Summary  for other counseling recommendations.   No follow-ups on file.  Future Appointments  Date Time Provider Department Center  08/09/2022  9:55 AM Levie Heritage, DO [facility] None  08/24/2022 12:00 PM [facility]  09/06/2022  9:35 AM Milas Hock, MD [facility] None    Levie Heritage, DO

## 2022-08-11 LAB — AFP, SERUM, OPEN SPINA BIFIDA
AFP MoM: 0.93
AFP Value: 38.4 ng/mL
Gest. Age on Collection Date: 17 wk
Maternal Age At EDD: 34.5 a
OSBR Risk 1 IN: 10000
Test Results:: NEGATIVE
Weight: 156 [lb_av]

## 2022-08-22 ENCOUNTER — Encounter: Payer: Self-pay | Admitting: *Deleted

## 2022-08-24 ENCOUNTER — Ambulatory Visit: Payer: Managed Care, Other (non HMO) | Attending: Family Medicine

## 2022-08-24 DIAGNOSIS — Z3480 Encounter for supervision of other normal pregnancy, unspecified trimester: Secondary | ICD-10-CM | POA: Diagnosis not present

## 2022-08-24 DIAGNOSIS — Z3A20 20 weeks gestation of pregnancy: Secondary | ICD-10-CM | POA: Insufficient documentation

## 2022-08-24 DIAGNOSIS — O321XX0 Maternal care for breech presentation, not applicable or unspecified: Secondary | ICD-10-CM | POA: Diagnosis not present

## 2022-08-24 DIAGNOSIS — Z363 Encounter for antenatal screening for malformations: Secondary | ICD-10-CM | POA: Diagnosis not present

## 2022-09-06 ENCOUNTER — Ambulatory Visit (INDEPENDENT_AMBULATORY_CARE_PROVIDER_SITE_OTHER): Payer: Self-pay | Admitting: Obstetrics and Gynecology

## 2022-09-06 ENCOUNTER — Other Ambulatory Visit (HOSPITAL_BASED_OUTPATIENT_CLINIC_OR_DEPARTMENT_OTHER): Payer: Self-pay

## 2022-09-06 VITALS — BP 110/66 | HR 93 | Wt 163.0 lb

## 2022-09-06 DIAGNOSIS — Z3480 Encounter for supervision of other normal pregnancy, unspecified trimester: Secondary | ICD-10-CM

## 2022-09-06 DIAGNOSIS — Z3A21 21 weeks gestation of pregnancy: Secondary | ICD-10-CM

## 2022-09-06 DIAGNOSIS — R319 Hematuria, unspecified: Secondary | ICD-10-CM

## 2022-09-06 MED ORDER — NITROFURANTOIN MONOHYD MACRO 100 MG PO CAPS
100.0000 mg | ORAL_CAPSULE | Freq: Two times a day (BID) | ORAL | 0 refills | Status: DC
  Filled 2022-09-06: qty 14, 7d supply, fill #0

## 2022-09-06 NOTE — Progress Notes (Signed)
Patient complaining of stomach pain with urination. Patient reports taking tylenol and it helped. Armandina Stammer RN

## 2022-09-06 NOTE — Progress Notes (Addendum)
   PRENATAL VISIT NOTE  Subjective:  Rhonda Fischer is a 32 y.o. G4P3003 at [redacted]w[redacted]d being seen today for ongoing prenatal care.  She is currently monitored for the following issues for this low-risk pregnancy and has Supervision of other normal pregnancy, antepartum on their problem list.  Patient reports  hematuria and dysuria .  Contractions: Not present. Vag. Bleeding: None.  Movement: Present. Denies leaking of fluid.   The following portions of the patient's history were reviewed and updated as appropriate: allergies, current medications, past family history, past medical history, past social history, past surgical history and problem list.   Objective:   Vitals:   09/06/22 0920  BP: 110/66  Pulse: 93  Weight: 163 lb (73.9 kg)    Fetal Status: Fetal Heart Rate (bpm): 145 Fundal Height: 21 cm Movement: Present     General:  Alert, oriented and cooperative. Patient is in no acute distress.  Skin: Skin is warm and dry. No rash noted.   Cardiovascular: Normal heart rate noted  Respiratory: Normal respiratory effort, no problems with respiration noted  Abdomen: Soft, gravid, appropriate for gestational age.  Pain/Pressure: Present     Pelvic: Cervical exam deferred        Extremities: Normal range of motion.  Edema: None  Mental Status: Normal mood and affect. Normal behavior. Normal judgment and thought content.   Assessment and Plan:  Pregnancy: G4P3003 at [redacted]w[redacted]d 1. Supervision of other normal pregnancy, antepartum AFP wnl. Anatomy US wnl.  Discussed BC: She thinks she would like PPTL. We also discussed LARC options. She has already tried Depo and Nexplanon in the past and did not like them. She is done having children.  Discussed circ: she thinks yes.  Sent macrobid for hematuria/dysuria. Ucx sent.   Preterm labor symptoms and general obstetric precautions including but not limited to vaginal bleeding, contractions, leaking of fluid and fetal movement were reviewed in  detail with the patient. Please refer to After Visit Summary for other counseling recommendations.   Return in about 4 weeks (around 10/04/2022) for LROB VISIT, MD or APP.  Future Appointments  Date Time Provider Department Center  10/04/2022  9:35 AM Levie Heritage, DO [facility] None  11/01/2022  8:35 AM Adrian Blackwater Rhona Raider, DO [facility] None    Milas Hock, MD

## 2022-09-06 NOTE — Addendum Note (Signed)
Addended by: Milas Hock A on: 09/06/2022 10:36 AM   Modules accepted: Orders

## 2022-10-04 ENCOUNTER — Ambulatory Visit (INDEPENDENT_AMBULATORY_CARE_PROVIDER_SITE_OTHER): Payer: Self-pay | Admitting: Family Medicine

## 2022-10-04 VITALS — BP 108/65 | HR 79 | Wt 169.0 lb

## 2022-10-04 DIAGNOSIS — Z3480 Encounter for supervision of other normal pregnancy, unspecified trimester: Secondary | ICD-10-CM

## 2022-10-04 DIAGNOSIS — Z3A25 25 weeks gestation of pregnancy: Secondary | ICD-10-CM

## 2022-10-04 NOTE — Progress Notes (Signed)
   PRENATAL VISIT NOTE  Subjective:  Rhonda Fischer is a 32 y.o. G4P3003 at [redacted]w[redacted]d being seen today for ongoing prenatal care.  She is currently monitored for the following issues for this low-risk pregnancy and has Supervision of other normal pregnancy, antepartum on their problem list.  Patient reports no complaints.  Contractions: Not present. Vag. Bleeding: None.  Movement: Present. Denies leaking of fluid.   The following portions of the patient's history were reviewed and updated as appropriate: allergies, current medications, past family history, past medical history, past social history, past surgical history and problem list.   Objective:   Vitals:   10/04/22 0942  BP: 108/65  Pulse: 79  Weight: 169 lb (76.7 kg)    Fetal Status: Fetal Heart Rate (bpm): 149   Movement: Present     General:  Alert, oriented and cooperative. Patient is in no acute distress.  Skin: Skin is warm and dry. No rash noted.   Cardiovascular: Normal heart rate noted  Respiratory: Normal respiratory effort, no problems with respiration noted  Abdomen: Soft, gravid, appropriate for gestational age.  Pain/Pressure: Absent     Pelvic: Cervical exam deferred        Extremities: Normal range of motion.  Edema: None  Mental Status: Normal mood and affect. Normal behavior. Normal judgment and thought content.   Assessment and Plan:  Pregnancy: G4P3003 at [redacted]w[redacted]d 1. [redacted] weeks gestation of pregnancy  2. Supervision of other normal pregnancy, antepartum FHT normal Leaning towards Cu IUD postpartum No other concerns. Was given lifting restrictions last time - Brought paperwork from work to fill out  Preterm labor symptoms and general obstetric precautions including but not limited to vaginal bleeding, contractions, leaking of fluid and fetal movement were reviewed in detail with the patient. Please refer to After Visit Summary for other counseling recommendations.   No follow-ups on file.  Future  Appointments  Date Time Provider Department Center  11/01/2022  8:35 AM Levie Heritage, DO [facility] None    Levie Heritage, DO

## 2022-11-01 ENCOUNTER — Ambulatory Visit: Payer: Managed Care, Other (non HMO) | Admitting: Family Medicine

## 2022-11-01 VITALS — BP 97/58 | HR 82 | Wt 169.0 lb

## 2022-11-01 DIAGNOSIS — Z3A29 29 weeks gestation of pregnancy: Secondary | ICD-10-CM

## 2022-11-01 DIAGNOSIS — Z23 Encounter for immunization: Secondary | ICD-10-CM

## 2022-11-01 DIAGNOSIS — Z1339 Encounter for screening examination for other mental health and behavioral disorders: Secondary | ICD-10-CM

## 2022-11-01 DIAGNOSIS — Z3483 Encounter for supervision of other normal pregnancy, third trimester: Secondary | ICD-10-CM

## 2022-11-01 DIAGNOSIS — Z3480 Encounter for supervision of other normal pregnancy, unspecified trimester: Secondary | ICD-10-CM

## 2022-11-01 NOTE — Progress Notes (Signed)
   PRENATAL VISIT NOTE  Subjective:  Rhonda Fischer is a 32 y.o. G4P3003 at [redacted]w[redacted]d being seen today for ongoing prenatal care.  She is currently monitored for the following issues for this low-risk pregnancy and has Supervision of other normal pregnancy, antepartum on their problem list.  Patient reports no complaints.  Contractions: Not present. Vag. Bleeding: None.  Movement: Present. Denies leaking of fluid.   The following portions of the patient's history were reviewed and updated as appropriate: allergies, current medications, past family history, past medical history, past social history, past surgical history and problem list.   Objective:   Vitals:   11/01/22 0834  BP: (!) 97/58  Pulse: 82  Weight: 169 lb (76.7 kg)    Fetal Status: Fetal Heart Rate (bpm): 146   Movement: Present     General:  Alert, oriented and cooperative. Patient is in no acute distress.  Skin: Skin is warm and dry. No rash noted.   Cardiovascular: Normal heart rate noted  Respiratory: Normal respiratory effort, no problems with respiration noted  Abdomen: Soft, gravid, appropriate for gestational age.  Pain/Pressure: Absent     Pelvic: Cervical exam deferred        Extremities: Normal range of motion.  Edema: None  Mental Status: Normal mood and affect. Normal behavior. Normal judgment and thought content.   Assessment and Plan:  Pregnancy: G4P3003 at 108w6d 1. [redacted] weeks gestation of pregnancy - Glucose Tolerance, 2 Hours w/1 Hour - CBC - RPR - HIV antibody (with reflex) - Tdap vaccine greater than or equal to 7yo IM  2. Supervision of other normal pregnancy, antepartum FHT normal - Glucose Tolerance, 2 Hours w/1 Hour - CBC - RPR - HIV antibody (with reflex) - Tdap vaccine greater than or equal to 7yo IM  Preterm labor symptoms and general obstetric precautions including but not limited to vaginal bleeding, contractions, leaking of fluid and fetal movement were reviewed in detail with  the patient. Please refer to After Visit Summary for other counseling recommendations.   No follow-ups on file.  Future Appointments  Date Time Provider Department Center  11/15/2022  9:15 AM Levie Heritage, DO [facility] None  11/29/2022 10:35 AM Lorriane Shire, MD [facility] None  12/13/2022 10:35 AM Levie Heritage, DO [facility] None    Levie Heritage, DO

## 2022-11-02 LAB — GLUCOSE TOLERANCE, 2 HOURS W/ 1HR
Glucose, 1 hour: 144 mg/dL (ref 70–179)
Glucose, 2 hour: 106 mg/dL (ref 70–152)
Glucose, Fasting: 81 mg/dL (ref 70–91)

## 2022-11-02 LAB — RPR: RPR Ser Ql: NONREACTIVE

## 2022-11-02 LAB — CBC
Hematocrit: 38 % (ref 34.0–46.6)
Hemoglobin: 12.6 g/dL (ref 11.1–15.9)
MCH: 26.3 pg — ABNORMAL LOW (ref 26.6–33.0)
MCHC: 33.2 g/dL (ref 31.5–35.7)
MCV: 79 fL (ref 79–97)
Platelets: 150 10*3/uL (ref 150–450)
RBC: 4.79 x10E6/uL (ref 3.77–5.28)
RDW: 13.6 % (ref 11.7–15.4)
WBC: 9.5 10*3/uL (ref 3.4–10.8)

## 2022-11-02 LAB — HIV ANTIBODY (ROUTINE TESTING W REFLEX): HIV Screen 4th Generation wRfx: NONREACTIVE

## 2022-11-15 ENCOUNTER — Ambulatory Visit (INDEPENDENT_AMBULATORY_CARE_PROVIDER_SITE_OTHER): Payer: Self-pay | Admitting: Family Medicine

## 2022-11-15 VITALS — BP 110/62 | HR 90 | Wt 170.0 lb

## 2022-11-15 DIAGNOSIS — Z3480 Encounter for supervision of other normal pregnancy, unspecified trimester: Secondary | ICD-10-CM

## 2022-11-15 NOTE — Progress Notes (Signed)
   PRENATAL VISIT NOTE  Subjective:  Rhonda Fischer is a 32 y.o. G4P3003 at [redacted]w[redacted]d being seen today for ongoing prenatal care.  She is currently monitored for the following issues for this low-risk pregnancy and has Supervision of other normal pregnancy, antepartum on their problem list.  Patient reports  having a lot of pelvic cramping while at work due to the bending and lifting  .  Contractions: Not present. Vag. Bleeding: None.  Movement: Present. Denies leaking of fluid.   The following portions of the patient's history were reviewed and updated as appropriate: allergies, current medications, past family history, past medical history, past social history, past surgical history and problem list.   Objective:   Vitals:   11/15/22 0923  BP: 110/62  Pulse: 90  Weight: 170 lb (77.1 kg)    Fetal Status: Fetal Heart Rate (bpm): 140   Movement: Present     General:  Alert, oriented and cooperative. Patient is in no acute distress.  Skin: Skin is warm and dry. No rash noted.   Cardiovascular: Normal heart rate noted  Respiratory: Normal respiratory effort, no problems with respiration noted  Abdomen: Soft, gravid, appropriate for gestational age.  Pain/Pressure: Absent     Pelvic: Cervical exam deferred        Extremities: Normal range of motion.  Edema: Trace  Mental Status: Normal mood and affect. Normal behavior. Normal judgment and thought content.   Assessment and Plan:  Pregnancy: G4P3003 at [redacted]w[redacted]d 1. Supervision of other normal pregnancy, antepartum FHT and FH normal She would like to leave work on FML at 36ish weeks. We discussed that she only has 12 weeks of FMLA and that the more time she takes off from work before the baby is here results in less time afterwards. She is still thinking about her options.  Preterm labor symptoms and general obstetric precautions including but not limited to vaginal bleeding, contractions, leaking of fluid and fetal movement were  reviewed in detail with the patient. Please refer to After Visit Summary for other counseling recommendations.   No follow-ups on file.  Future Appointments  Date Time Provider Department Center  11/29/2022 10:35 AM Lorriane Shire, MD [facility] None  12/13/2022 10:35 AM Levie Heritage, DO [facility] None  12/20/2022 10:35 AM Levie Heritage, DO [facility] None  12/27/2022 10:35 AM Adam Phenix, MD [facility] None  01/03/2023 10:35 AM Levie Heritage, DO [facility] None    Levie Heritage, DO

## 2022-11-29 ENCOUNTER — Ambulatory Visit (INDEPENDENT_AMBULATORY_CARE_PROVIDER_SITE_OTHER): Payer: Self-pay | Admitting: Obstetrics and Gynecology

## 2022-11-29 VITALS — BP 96/61 | HR 87 | Wt 171.0 lb

## 2022-11-29 DIAGNOSIS — Z3A33 33 weeks gestation of pregnancy: Secondary | ICD-10-CM

## 2022-11-29 DIAGNOSIS — Z3480 Encounter for supervision of other normal pregnancy, unspecified trimester: Secondary | ICD-10-CM

## 2022-11-29 NOTE — Progress Notes (Signed)
   PRENATAL VISIT NOTE  Subjective:  Rhonda Fischer is a 32 y.o. G4P3003 at [redacted]w[redacted]d being seen today for ongoing prenatal care.  She is currently monitored for the following issues for this low-risk pregnancy and has Supervision of other normal pregnancy, antepartum on their problem list.  Patient reports  occasional cramping .  Contractions: Not present. Vag. Bleeding: None.  Movement: Present. Denies leaking of fluid.   The following portions of the patient's history were reviewed and updated as appropriate: allergies, current medications, past family history, past medical history, past social history, past surgical history and problem list.   Objective:   Vitals:   11/29/22 1042  BP: 96/61  Pulse: 87  Weight: 171 lb (77.6 kg)    Fetal Status: Fetal Heart Rate (bpm): 145   Movement: Present     General:  Alert, oriented and cooperative. Patient is in no acute distress.  Skin: Skin is warm and dry. No rash noted.   Cardiovascular: Normal heart rate noted  Respiratory: Normal respiratory effort, no problems with respiration noted  Abdomen: Soft, gravid, appropriate for gestational age.  Pain/Pressure: Present (some cramping)     Pelvic: Cervical exam deferred        Extremities: Normal range of motion.     Mental Status: Normal mood and affect. Normal behavior. Normal judgment and thought content.   Assessment and Plan:  Pregnancy: G4P3003 at [redacted]w[redacted]d 1. Supervision of other normal pregnancy, antepartum FHR and FH wnl  2. [redacted] weeks gestation of pregnancy   Preterm labor symptoms and general obstetric precautions including but not limited to vaginal bleeding, contractions, leaking of fluid and fetal movement were reviewed in detail with the patient. Please refer to After Visit Summary for other counseling recommendations.   No follow-ups on file.  Future Appointments  Date Time Provider Department Center  12/13/2022 10:35 AM Levie Heritage, DO [facility] None  12/20/2022  10:35 AM Levie Heritage, DO [facility] None  12/27/2022 10:35 AM Adam Phenix, MD [facility] None  01/03/2023 10:35 AM Levie Heritage, DO [facility] None    Lorriane Shire, MD

## 2022-12-13 ENCOUNTER — Ambulatory Visit: Payer: Managed Care, Other (non HMO) | Admitting: Family Medicine

## 2022-12-13 VITALS — BP 104/70 | HR 85 | Wt 170.0 lb

## 2022-12-13 DIAGNOSIS — Z3480 Encounter for supervision of other normal pregnancy, unspecified trimester: Secondary | ICD-10-CM

## 2022-12-13 DIAGNOSIS — Z3A35 35 weeks gestation of pregnancy: Secondary | ICD-10-CM

## 2022-12-13 NOTE — Progress Notes (Signed)
   PRENATAL VISIT NOTE  Subjective:  Rhonda Fischer is a 32 y.o. G4P3003 at [redacted]w[redacted]d being seen today for ongoing prenatal care.  She is currently monitored for the following issues for this low-risk pregnancy and has Supervision of other normal pregnancy, antepartum on their problem list.  Patient reports no complaints.  Contractions: Irritability. Vag. Bleeding: None.  Movement: Present. Denies leaking of fluid.   The following portions of the patient's history were reviewed and updated as appropriate: allergies, current medications, past family history, past medical history, past social history, past surgical history and problem list.   Objective:   Vitals:   12/13/22 1100  BP: 104/70  Pulse: 85  Weight: 170 lb (77.1 kg)    Fetal Status: Fetal Heart Rate (bpm): 141 Fundal Height: 35 cm Movement: Present  Presentation: Vertex  General:  Alert, oriented and cooperative. Patient is in no acute distress.  Skin: Skin is warm and dry. No rash noted.   Cardiovascular: Normal heart rate noted  Respiratory: Normal respiratory effort, no problems with respiration noted  Abdomen: Soft, gravid, appropriate for gestational age.  Pain/Pressure: Present     Pelvic: Cervical exam deferred        Extremities: Normal range of motion.  Edema: None  Mental Status: Normal mood and affect. Normal behavior. Normal judgment and thought content.   Assessment and Plan:  Pregnancy: G4P3003 at [redacted]w[redacted]d 1. [redacted] weeks gestation of pregnancy  2. Supervision of other normal pregnancy, antepartum FHT and FH normal  Preterm labor symptoms and general obstetric precautions including but not limited to vaginal bleeding, contractions, leaking of fluid and fetal movement were reviewed in detail with the patient. Please refer to After Visit Summary for other counseling recommendations.   No follow-ups on file.  Future Appointments  Date Time Provider Department Center  12/20/2022 10:35 AM Levie Heritage, DO  [facility] None  12/27/2022 10:35 AM Adam Phenix, MD [facility] None  01/03/2023 10:35 AM Levie Heritage, DO [facility] None    Levie Heritage, DO

## 2022-12-20 ENCOUNTER — Ambulatory Visit: Payer: Managed Care, Other (non HMO) | Admitting: Family Medicine

## 2022-12-20 ENCOUNTER — Other Ambulatory Visit (HOSPITAL_COMMUNITY)
Admission: RE | Admit: 2022-12-20 | Discharge: 2022-12-20 | Disposition: A | Discharge status: Home / Self Care | Payer: Managed Care, Other (non HMO) | Source: Ambulatory Visit | Attending: Family Medicine | Admitting: Family Medicine

## 2022-12-20 VITALS — BP 107/70 | HR 89 | Wt 168.0 lb

## 2022-12-20 DIAGNOSIS — Z3480 Encounter for supervision of other normal pregnancy, unspecified trimester: Secondary | ICD-10-CM | POA: Insufficient documentation

## 2022-12-20 DIAGNOSIS — Z3A36 36 weeks gestation of pregnancy: Secondary | ICD-10-CM

## 2022-12-20 NOTE — Progress Notes (Signed)
   PRENATAL VISIT NOTE  Subjective:  Rhonda Fischer is a 32 y.o. G4P3003 at [redacted]w[redacted]d being seen today for ongoing prenatal care.  She is currently monitored for the following issues for this low-risk pregnancy and has Supervision of other normal pregnancy, antepartum on their problem list.  Patient reports occasional contractions.  Contractions: Irritability. Vag. Bleeding: None.  Movement: Present. Denies leaking of fluid.   The following portions of the patient's history were reviewed and updated as appropriate: allergies, current medications, past family history, past medical history, past social history, past surgical history and problem list.   Objective:   Vitals:   12/20/22 1038  BP: 107/70  Pulse: 89  Weight: 168 lb (76.2 kg)    Fetal Status: Fetal Heart Rate (bpm): 142 Fundal Height: 36 cm Movement: Present  Presentation: Vertex  General:  Alert, oriented and cooperative. Patient is in no acute distress.  Skin: Skin is warm and dry. No rash noted.   Cardiovascular: Normal heart rate noted  Respiratory: Normal respiratory effort, no problems with respiration noted  Abdomen: Soft, gravid, appropriate for gestational age.  Pain/Pressure: Absent     Pelvic: Cervical exam performed in the presence of a chaperone Dilation: 1 Effacement (%): Thick Station: -3  Extremities: Normal range of motion.  Edema: None  Mental Status: Normal mood and affect. Normal behavior. Normal judgment and thought content.   Assessment and Plan:  Pregnancy: G4P3003 at [redacted]w[redacted]d 1. [redacted] weeks gestation of pregnancy  2. Supervision of other normal pregnancy, antepartum FHT and FH normal   Preterm labor symptoms and general obstetric precautions including but not limited to vaginal bleeding, contractions, leaking of fluid and fetal movement were reviewed in detail with the patient. Please refer to After Visit Summary for other counseling recommendations.   No follow-ups on file.  Future  Appointments  Date Time Provider Department Center  12/27/2022 10:35 AM Adam Phenix, MD [facility] None  01/03/2023 10:35 AM Levie Heritage, DO [facility] None    Levie Heritage, DO

## 2022-12-23 LAB — CULTURE, BETA STREP (GROUP B ONLY): Strep Gp B Culture: NEGATIVE

## 2022-12-24 LAB — CERVICOVAGINAL ANCILLARY ONLY
Chlamydia: NEGATIVE
Comment: NEGATIVE
Comment: NORMAL
Neisseria Gonorrhea: NEGATIVE

## 2022-12-27 ENCOUNTER — Ambulatory Visit: Payer: Managed Care, Other (non HMO) | Admitting: Obstetrics & Gynecology

## 2022-12-27 VITALS — BP 123/65 | HR 86 | Wt 170.0 lb

## 2022-12-27 DIAGNOSIS — Z3480 Encounter for supervision of other normal pregnancy, unspecified trimester: Secondary | ICD-10-CM

## 2022-12-27 NOTE — Progress Notes (Signed)
   PRENATAL VISIT NOTE  Subjective:  Rhonda Fischer is a 32 y.o. G4P3003 at [redacted]w[redacted]d being seen today for ongoing prenatal care.  She is currently monitored for the following issues for this low-risk pregnancy and has Supervision of other normal pregnancy, antepartum on their problem list.  Patient reports occasional contractions.  Contractions: Not present. Vag. Bleeding: None.  Movement: Present. Denies leaking of fluid.   The following portions of the patient's history were reviewed and updated as appropriate: allergies, current medications, past family history, past medical history, past social history, past surgical history and problem list.   Objective:   Vitals:   12/27/22 1044  BP: 123/65  Pulse: 86  Weight: 170 lb (77.1 kg)    Fetal Status: Fetal Heart Rate (bpm): 132   Movement: Present     General:  Alert, oriented and cooperative. Patient is in no acute distress.  Skin: Skin is warm and dry. No rash noted.   Cardiovascular: Normal heart rate noted  Respiratory: Normal respiratory effort, no problems with respiration noted  Abdomen: Soft, gravid, appropriate for gestational age.  Pain/Pressure: Absent     Pelvic: Cervical exam deferred        Extremities: Normal range of motion.  Edema: None  Mental Status: Normal mood and affect. Normal behavior. Normal judgment and thought content.   Assessment and Plan:  Pregnancy: G4P3003 at [redacted]w[redacted]d 1. Supervision of other normal pregnancy, antepartum   Term labor symptoms and general obstetric precautions including but not limited to vaginal bleeding, contractions, leaking of fluid and fetal movement were reviewed in detail with the patient. Please refer to After Visit Summary for other counseling recommendations.   Return in about 1 week (around 01/03/2023).  Future Appointments  Date Time Provider Department Center  01/03/2023 10:35 AM Levie Heritage, DO [facility] None    Scheryl Darter, MD

## 2023-01-03 ENCOUNTER — Ambulatory Visit (INDEPENDENT_AMBULATORY_CARE_PROVIDER_SITE_OTHER): Payer: Self-pay | Admitting: Family Medicine

## 2023-01-03 VITALS — BP 122/73 | HR 86 | Wt 170.0 lb

## 2023-01-03 DIAGNOSIS — Z3480 Encounter for supervision of other normal pregnancy, unspecified trimester: Secondary | ICD-10-CM

## 2023-01-03 NOTE — Progress Notes (Signed)
   PRENATAL VISIT NOTE  Subjective:  Rhonda Fischer is a 32 y.o. G4P3003 at [redacted]w[redacted]d being seen today for ongoing prenatal care.  She is currently monitored for the following issues for this low-risk pregnancy and has Supervision of other normal pregnancy, antepartum on their problem list.  Patient reports occasional contractions.  Contractions: Irritability. Vag. Bleeding: Scant.  Movement: Present. Denies leaking of fluid.   The following portions of the patient's history were reviewed and updated as appropriate: allergies, current medications, past family history, past medical history, past social history, past surgical history and problem list.   Objective:   Vitals:   01/03/23 1052  BP: 122/73  Pulse: 86  Weight: 170 lb (77.1 kg)    Fetal Status: Fetal Heart Rate (bpm): 143 Fundal Height: 39 cm Movement: Present  Presentation: Vertex  General:  Alert, oriented and cooperative. Patient is in no acute distress.  Skin: Skin is warm and dry. No rash noted.   Cardiovascular: Normal heart rate noted  Respiratory: Normal respiratory effort, no problems with respiration noted  Abdomen: Soft, gravid, appropriate for gestational age.  Pain/Pressure: Absent     Pelvic: Cervical exam performed in the presence of a chaperone Dilation: 2 Effacement (%): 50 Station: -2  Extremities: Normal range of motion.  Edema: None  Mental Status: Normal mood and affect. Normal behavior. Normal judgment and thought content.   Assessment and Plan:  Pregnancy: G4P3003 at [redacted]w[redacted]d 1. Supervision of other normal pregnancy, antepartum Induction scheduled for 41 weeks  Term labor symptoms and general obstetric precautions including but not limited to vaginal bleeding, contractions, leaking of fluid and fetal movement were reviewed in detail with the patient. Please refer to After Visit Summary for other counseling recommendations.   No follow-ups on file.  No future appointments.  Levie Heritage,  DO

## 2023-01-04 ENCOUNTER — Other Ambulatory Visit: Payer: Self-pay

## 2023-01-04 ENCOUNTER — Encounter (HOSPITAL_COMMUNITY): Payer: Self-pay | Admitting: Obstetrics and Gynecology

## 2023-01-04 ENCOUNTER — Inpatient Hospital Stay (HOSPITAL_COMMUNITY)
Admission: AD | Admit: 2023-01-04 | Discharge: 2023-01-05 | DRG: 807 | Disposition: A | Discharge status: Home / Self Care | Payer: Managed Care, Other (non HMO) | Attending: Obstetrics and Gynecology | Admitting: Obstetrics and Gynecology

## 2023-01-04 DIAGNOSIS — Z23 Encounter for immunization: Secondary | ICD-10-CM | POA: Diagnosis not present

## 2023-01-04 DIAGNOSIS — Z8701 Personal history of pneumonia (recurrent): Secondary | ICD-10-CM

## 2023-01-04 DIAGNOSIS — Z3A39 39 weeks gestation of pregnancy: Secondary | ICD-10-CM

## 2023-01-04 DIAGNOSIS — O26893 Other specified pregnancy related conditions, third trimester: Secondary | ICD-10-CM | POA: Diagnosis present

## 2023-01-04 DIAGNOSIS — Z8616 Personal history of COVID-19: Secondary | ICD-10-CM

## 2023-01-04 LAB — CBC
HCT: 36.7 % (ref 36.0–46.0)
Hemoglobin: 11.8 g/dL — ABNORMAL LOW (ref 12.0–15.0)
MCH: 24.8 pg — ABNORMAL LOW (ref 26.0–34.0)
MCHC: 32.2 g/dL (ref 30.0–36.0)
MCV: 77.1 fL — ABNORMAL LOW (ref 80.0–100.0)
Platelets: 143 10*3/uL — ABNORMAL LOW (ref 150–400)
RBC: 4.76 MIL/uL (ref 3.87–5.11)
RDW: 15.5 % (ref 11.5–15.5)
WBC: 10.7 10*3/uL — ABNORMAL HIGH (ref 4.0–10.5)
nRBC: 0 % (ref 0.0–0.2)

## 2023-01-04 LAB — RPR: RPR Ser Ql: NONREACTIVE

## 2023-01-04 LAB — TYPE AND SCREEN
ABO/RH(D): B POS
Antibody Screen: NEGATIVE

## 2023-01-04 MED ORDER — COCONUT OIL OIL: 1.0000 | TOPICAL_OIL | Status: DC | PRN

## 2023-01-04 MED ORDER — IBUPROFEN 600 MG PO TABS
600.0000 mg | ORAL_TABLET | Freq: Four times a day (QID) | ORAL | Status: DC
  Administered 2023-01-04 – 2023-01-05 (×6): 600 mg via ORAL
  Filled 2023-01-04 (×6): qty 1

## 2023-01-04 MED ORDER — SODIUM CHLORIDE 0.9% FLUSH: 3.0000 mL | INTRAVENOUS | Status: DC | PRN

## 2023-01-04 MED ORDER — OXYTOCIN BOLUS FROM INFUSION
333.0000 mL | Freq: Once | INTRAVENOUS | Status: AC
  Administered 2023-01-04: 333 mL via INTRAVENOUS

## 2023-01-04 MED ORDER — OXYTOCIN-SODIUM CHLORIDE 30-0.9 UT/500ML-% IV SOLN
1.0000 m[IU]/min | INTRAVENOUS | Status: DC
  Administered 2023-01-04: 2 m[IU]/min via INTRAVENOUS
  Filled 2023-01-04: qty 500

## 2023-01-04 MED ORDER — ONDANSETRON HCL 4 MG/2ML IJ SOLN: 4.0000 mg | Freq: Four times a day (QID) | INTRAMUSCULAR | Status: DC | PRN

## 2023-01-04 MED ORDER — LACTATED RINGERS IV SOLN
500.0000 mL | INTRAVENOUS | Status: DC | PRN
  Administered 2023-01-04: 500 mL via INTRAVENOUS

## 2023-01-04 MED ORDER — OXYCODONE-ACETAMINOPHEN 5-325 MG PO TABS: 1.0000 | ORAL_TABLET | ORAL | Status: DC | PRN

## 2023-01-04 MED ORDER — LIDOCAINE HCL (PF) 1 % IJ SOLN: 30.0000 mL | INTRAMUSCULAR | Status: DC | PRN

## 2023-01-04 MED ORDER — SENNOSIDES-DOCUSATE SODIUM 8.6-50 MG PO TABS
2.0000 | ORAL_TABLET | ORAL | Status: DC
  Administered 2023-01-04 – 2023-01-05 (×2): 2 via ORAL
  Filled 2023-01-04 (×2): qty 2

## 2023-01-04 MED ORDER — WITCH HAZEL-GLYCERIN EX PADS: 1.0000 | MEDICATED_PAD | CUTANEOUS | Status: DC | PRN

## 2023-01-04 MED ORDER — DIBUCAINE (PERIANAL) 1 % EX OINT: 1.0000 | TOPICAL_OINTMENT | CUTANEOUS | Status: DC | PRN

## 2023-01-04 MED ORDER — BENZOCAINE-MENTHOL 20-0.5 % EX AERO: 1.0000 | INHALATION_SPRAY | CUTANEOUS | Status: DC | PRN

## 2023-01-04 MED ORDER — MEASLES, MUMPS & RUBELLA VAC IJ SOLR: 0.5000 mL | Freq: Once | INTRAMUSCULAR | Status: DC

## 2023-01-04 MED ORDER — SOD CITRATE-CITRIC ACID 500-334 MG/5ML PO SOLN: 30.0000 mL | ORAL | Status: DC | PRN

## 2023-01-04 MED ORDER — ACETAMINOPHEN 325 MG PO TABS: 650.0000 mg | ORAL_TABLET | ORAL | Status: DC | PRN

## 2023-01-04 MED ORDER — ZOLPIDEM TARTRATE 5 MG PO TABS: 5.0000 mg | ORAL_TABLET | Freq: Every evening | ORAL | Status: DC | PRN

## 2023-01-04 MED ORDER — SODIUM CHLORIDE 0.9% FLUSH: 3.0000 mL | Freq: Two times a day (BID) | INTRAVENOUS | Status: DC

## 2023-01-04 MED ORDER — DIPHENHYDRAMINE HCL 25 MG PO CAPS: 25.0000 mg | ORAL_CAPSULE | Freq: Four times a day (QID) | ORAL | Status: DC | PRN

## 2023-01-04 MED ORDER — TERBUTALINE SULFATE 1 MG/ML IJ SOLN: 0.2500 mg | Freq: Once | INTRAMUSCULAR | Status: DC | PRN

## 2023-01-04 MED ORDER — OXYTOCIN-SODIUM CHLORIDE 30-0.9 UT/500ML-% IV SOLN
2.5000 [IU]/h | INTRAVENOUS | Status: DC
  Administered 2023-01-04: 2.5 [IU]/h via INTRAVENOUS

## 2023-01-04 MED ORDER — SIMETHICONE 80 MG PO CHEW: 80.0000 mg | CHEWABLE_TABLET | ORAL | Status: DC | PRN

## 2023-01-04 MED ORDER — TETANUS-DIPHTH-ACELL PERTUSSIS 5-2.5-18.5 LF-MCG/0.5 IM SUSY: 0.5000 mL | PREFILLED_SYRINGE | Freq: Once | INTRAMUSCULAR | Status: DC

## 2023-01-04 MED ORDER — PRENATAL MULTIVITAMIN CH
1.0000 | ORAL_TABLET | Freq: Every day | ORAL | Status: DC
  Administered 2023-01-04 – 2023-01-05 (×2): 1 via ORAL
  Filled 2023-01-04 (×2): qty 1

## 2023-01-04 MED ORDER — LACTATED RINGERS IV SOLN: INTRAVENOUS | Status: DC

## 2023-01-04 MED ORDER — FENTANYL CITRATE (PF) 100 MCG/2ML IJ SOLN: 50.0000 ug | INTRAMUSCULAR | Status: DC | PRN

## 2023-01-04 MED ORDER — SODIUM CHLORIDE 0.9% FLUSH
10.0000 mL | Freq: Two times a day (BID) | INTRAVENOUS | Status: DC
  Administered 2023-01-04: 10 mL via INTRAVENOUS

## 2023-01-04 MED ORDER — OXYCODONE-ACETAMINOPHEN 5-325 MG PO TABS: 2.0000 | ORAL_TABLET | ORAL | Status: DC | PRN

## 2023-01-04 MED ORDER — ONDANSETRON HCL 4 MG/2ML IJ SOLN: 4.0000 mg | INTRAMUSCULAR | Status: DC | PRN

## 2023-01-04 MED ORDER — ONDANSETRON HCL 4 MG PO TABS: 4.0000 mg | ORAL_TABLET | ORAL | Status: DC | PRN

## 2023-01-04 NOTE — Progress Notes (Addendum)
Labor Progress Note Rhonda Fischer is a 32 y.o. [phone] at [redacted]w[redacted]d presented for spontaneous onset of labor S: Tolerating labor well. Desires to deliver her baby as soon as possible.   O:  BP 111/70   Pulse 91   Temp 98 F (36.7 C) (Oral)   Resp 20   Ht 5\' 6"  (1.676 m)   Wt 77.6 kg   LMP 03/19/2022 (Approximate)   SpO2 100%   BMI 27.60 kg/m  EFM: 145/mod variability/+accels/ occasional variable decels with prompt return to baseline.   CVE: Dilation: 5 Effacement (%): 80 Station: -2 Presentation: Vertex Exam by:: Joline Salt RN   A&P: 32 y.o. Q6V7846 [redacted]w[redacted]d admitted for spontaneous onset of labor.  #Labor: Progressing well. Discussed risks and benefits of amniotomy at this time to advance labor progress. Patient verbally consented to amniotomy at this time. AROM at 0556 clear fluid.  #Pain: Per patient request.  #FWB: Cat II for variable decels. Tracing is reassuring given presence of reactivity and mod variability  #GBS negative  This patient's plan of care has been discussed with the OB fellow Dr. Leanora Cover. Please see attestation.    Charma Igo, MD 5:45 AM  GME ATTESTATION:  Evaluation and management procedures were performed by the [facility] Medicine Resident under my supervision. I was immediately available for direct supervision, assistance and direction throughout this encounter.  I also confirm that I have verified the information documented in the resident's note, and that I have also personally reperformed the pertinent components of the physical exam and all of the medical decision making activities.  I have also made any necessary editorial changes.  Wyn Forster, MD OB Fellow, Faculty Practice [facility], Center for [facility] 01/04/2023 8:38 AM

## 2023-01-04 NOTE — Discharge Summary (Signed)
Postpartum Discharge Summary  Date of Service updated***     Patient Name: Rhonda Fischer DOB: 09/25/1990 MRN: [id number]  Date of admission: 01/04/2023 Delivery date:01/04/2023 Delivering provider: Chemeka Filice C Date of discharge: 01/04/2023  Admitting diagnosis: Normal labor [O80, Z37.9] Intrauterine pregnancy: [redacted]w[redacted]d     Secondary diagnosis:  Principal Problem:   Normal labor  Additional problems: ***    Discharge diagnosis: Term Pregnancy Delivered                                              Post partum procedures:{Postpartum procedures:23558} Augmentation: AROM and Pitocin Complications: None  Hospital course: Onset of Labor With Vaginal Delivery      32 y.o. yo [id number] at [redacted]w[redacted]d was admitted in Latent Labor on 01/04/2023. Labor course was complicated by n/a.   Membrane Rupture Time/Date: 5:55 AM,01/04/2023  Delivery Method:Vaginal, Spontaneous Operative Delivery:N/A Episiotomy: None Lacerations:  None Patient had a postpartum course complicated by ***.  She is ambulating, tolerating a regular diet, passing flatus, and urinating well. Patient is discharged home in stable condition on 01/04/23.  Newborn Data: Birth date:01/04/2023 Birth time:8:42 AM Gender:Female Living status:Living Apgars:9 ,9  Weight:   Magnesium Sulfate received: {Mag received:30440022} BMZ received: No Rhophylac:N/A MMR:N/A T-DaP:Given prenatally Flu: {FAO:13086} RSV Vaccine received: {RSV:31013} Transfusion:{Transfusion received:30440034}  Immunizations received: Immunization History  Administered Date(s) Administered   Influenza Split 04/10/2012   Influenza,inj,Quad PF,6+ Mos 12/16/2014, 04/12/2016, 12/24/2016, 12/30/2018   Influenza-Unspecified 12/29/2018   Tdap 10/13/2012, 11/30/2014, 03/31/2019, 11/01/2022    Physical exam  Vitals:   01/04/23 0111 01/04/23 0123 01/04/23 0424 01/04/23 0721  BP: 114/75 115/76 111/70 116/72  Pulse: 86 91 91 88  Resp: 18  20 16    Temp:   98 F (36.7 C) 98.2 F (36.8 C)  TempSrc:   Oral Oral  SpO2: 99% 99% 100%   Weight:   77.6 kg   Height:   5\' 6"  (1.676 m)    General: {Exam; general:21111117} Lochia: {Desc; appropriate/inappropriate:30686::"appropriate"} Uterine Fundus: {Desc; firm/soft:30687} Incision: {Exam; incision:21111123} DVT Evaluation: {Exam; dvt:2111122} Labs: Lab Results  Component Value Date   WBC 10.7 (H) 01/04/2023   HGB 11.8 (L) 01/04/2023   HCT 36.7 01/04/2023   MCV 77.1 (L) 01/04/2023   PLT 143 (L) 01/04/2023      Latest Ref Rng & Units 04/28/2019    9:02 PM  CMP  Glucose 70 - 99 mg/dL 99   BUN 6 - 20 mg/dL 5   Creatinine 5.78 - 4.69 mg/dL 6.29   Sodium 528 - 413 mmol/L 134   Potassium 3.5 - 5.1 mmol/L 3.7   Chloride 98 - 111 mmol/L 106   CO2 22 - 32 mmol/L 18   Calcium 8.9 - 10.3 mg/dL 8.3   Total Protein 6.5 - 8.1 g/dL 6.1   Total Bilirubin 0.3 - 1.2 mg/dL 0.6   Alkaline Phos 38 - 126 U/L 84   AST 15 - 41 U/L 34   ALT 0 - 44 U/L 28    Edinburgh Score:    08/11/2019    3:41 PM  Edinburgh Postnatal Depression Scale Screening Tool  I have been able to laugh and see the funny side of things. 0  I have looked forward with enjoyment to things. 0  I have blamed myself unnecessarily when things went wrong. 0  I have been anxious or  worried for no good reason. 0  I have felt scared or panicky for no good reason. 0  Things have been getting on top of me. 0  I have been so unhappy that I have had difficulty sleeping. 0  I have felt sad or miserable. 0  I have been so unhappy that I have been crying. 0  The thought of harming myself has occurred to me. 0  Edinburgh Postnatal Depression Scale Total 0   No data recorded  After visit meds:  Allergies as of 01/04/2023   No Known Allergies   Med Rec must be completed prior to using this [facility]***        Discharge home in stable condition Infant Feeding: {Baby feeding:23562} Infant Disposition:{CHL IP OB HOME WITH  YWVPXT:06269} Discharge instruction: per After Visit Summary and Postpartum booklet. Activity: Advance as tolerated. Pelvic rest for 6 weeks.  Diet: {OB SWNI:62703500} Future Appointments: Future Appointments  Date Time Provider Department Center  01/10/2023  1:50 PM Levie Heritage, DO [facility] None   Follow up Visit: Message sent to [facility] 10/18  Please schedule this patient for a In person postpartum visit in 4 weeks with the following provider: Any provider. Additional Postpartum F/U: n/a   Low risk pregnancy complicated by:  n/a Delivery mode:  Vaginal, Spontaneous Anticipated Birth Control:   PP BTL desired   01/04/2023 Hessie Dibble, MD

## 2023-01-04 NOTE — Lactation Note (Signed)
This note was copied from a baby's chart. Lactation Consultation Note  Patient Name: Rhonda Fischer ACZYS'A Date: 01/04/2023 Age:34 hours  Reason for consult: Initial assessment;Term  P4, [redacted]w[redacted]d  Mother cheerfully greeted LC and spoke English during this visit. Mother has declined the need for an interpreter.  Initial LC visit to see P4 mother. She states she plans to breast and formula feed while in the hospital and after discharge. She would like to breastfeed this baby longer than she did her other children. Discussed latching baby before feeding baby formula. Mother agreeable.  Mother encouraged to latch baby with feeding cues, place baby skin to skin if not latching, and call for assistance with breastfeeding as needed.   Mom made aware of O/P services, breastfeeding support groups, community resources, and our phone # for post-discharge questions.     Maternal Data Does the patient have breastfeeding experience prior to this delivery?: Yes How long did the patient breastfeed?: 3-8 months  Feeding Mother's Current Feeding Choice: Breast Milk and Formula   Discharge Pump: Personal  Consult Status Consult Status: Follow-up Date: 01/05/23 Follow-up type: In-patient    Omar Person, RN, IBCLC 01/04/2023, 7:08 PM

## 2023-01-04 NOTE — Progress Notes (Addendum)
Labor Progress Note Rhonda Fischer is a 32 y.o. [phone] at [redacted]w[redacted]d presented for spontaneous onset of labor. S: Doing well, breathwork for contractions tolerating adequately.   O:  BP 111/70   Pulse 91   Temp 98 F (36.7 C) (Oral)   Resp 20   Ht 5\' 6"  (1.676 m)   Wt 77.6 kg   LMP 03/19/2022 (Approximate)   SpO2 100%   BMI 27.60 kg/m  EFM: 150/mod variability/+accels/ occasional variable decels observed.  CVE: Dilation: 5 Effacement (%): 50 Station: -1 Presentation: Vertex Exam by:: Thomes Cake   A&P: 32 y.o. A5W0981 [redacted]w[redacted]d admitted for spontaneous onset of labor.  #Labor: Currently in latent phase, s/p AROM. CTX pattern not favorable, discussed augmentation with pitocin which she is agreeable at this time.  #Pain: Per patient request.  #FWB: Cat II given occasional variable decels. Overall reassuring given reactivity and mod variability. #GBS negative  This patient's plan of care has been discussed with [place], CNM. Please see attestation.    Charma Igo, MD 7:08 AM

## 2023-01-04 NOTE — H&P (Signed)
OBSTETRIC ADMISSION HISTORY AND PHYSICAL  Rhonda Fischer is a 32 y.o. female [phone] with IUP at [redacted]w[redacted]d by first trimester [place] presenting for spontaneous onset of labor. She reports +FMs, No LOF, no VB, no blurry vision, headaches or peripheral edema, and RUQ pain.  She plans on breastfeeding. She request BTL postpartum for birth control. She received her prenatal care at  [facility]    Dating: By first trimester [place] --->  Estimated Date of Delivery: 01/11/23  Sono:   @[redacted]w[redacted]d , CWD, normal anatomy, 386g, 90% EFW   Prenatal History/Complications:  Patient Active Problem List   Diagnosis Date Noted   Supervision of other normal pregnancy, antepartum 07/12/2022     Past Medical History: Past Medical History:  Diagnosis Date   GERD (gastroesophageal reflux disease)    Language barrier 05/08/2012   Speaks Creole only.  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, but now works in IT)   Pneumonia due to COVID-19 virus 04/29/2019   Positive 04/28/19  Good oxygen saturation, no dyspnea  + CXR and CTA for pneumonia  Chest wall pain was only symptom at diagnosis    Past Surgical History: Past Surgical History:  Procedure Laterality Date   NO PAST SURGERIES      Obstetrical History: OB History     Gravida  4   Para  3   Term  3   Preterm  0   AB  0   Living  3      SAB  0   IAB  0   Ectopic  0   Multiple      Live Births  3           Social History Social History   Socioeconomic History   Marital status: Married    Spouse name: Not on file   Number of children: Not on file   Years of education: Not on file   Highest education level: Not on file  Occupational History   Not on file  Tobacco Use   Smoking status: Never   Smokeless tobacco: Never  Vaping Use   Vaping status: Never Used  Substance and Sexual  Activity   Alcohol use: No   Drug use: No   Sexual activity: Not on file  Other Topics Concern   Not on file  Social History Narrative   ** Merged History Encounter **       Social Determinants of Health   Financial Resource Strain: Not on file  Food Insecurity: No Food Insecurity (12/29/2018)   Hunger Vital Sign    Worried About Running Out of Food in the Last Year: Never true    Ran Out of Food in the Last Year: Never true  Transportation Needs: No Transportation Needs (12/29/2018)   PRAPARE - Administrator, Civil Service (Medical): No    Lack of Transportation (Non-Medical): No  Physical Activity: Not on file  Stress: Not on file  Social Connections: Not on file    Family History: Family History  Problem Relation Age of Onset   Cancer Neg Hx    Hypertension Neg Hx     Allergies: No Known Allergies  Medications Prior to Admission  Medication Sig Dispense Refill Last Dose   famotidine (PEPCID) 20 MG tablet Take 1 tablet (20 mg total) by mouth daily. 30 tablet 0  nitrofurantoin, macrocrystal-monohydrate, (MACROBID) 100 MG capsule Take 1 capsule (100 mg total) by mouth 2 (two) times daily. (Patient not taking: Reported on 01/03/2023) 14 capsule 0      Review of Systems   All systems reviewed and negative except as stated in HPI  Blood pressure 115/76, pulse 91, resp. rate 18, last menstrual period 03/19/2022, SpO2 99%, unknown if currently breastfeeding. General appearance: alert, cooperative, appears stated age, and moderate distress Lungs: clear to auscultation bilaterally Heart: regular rate and rhythm Abdomen: soft, non-tender; bowel sounds normal  Extremities: Homans sign is negative, no sign of DVT Presentation: cephalic Fetal monitoringBaseline: 140 bpm, Variability: Good {> 6 bpm), Accelerations: Reactive, and Decelerations: Variable: mild Uterine activity irregular ctx Dilation: 5 Effacement (%): 80 Station: -2 Exam by:: Joline Salt  RN   Prenatal labs: ABO, Rh: B/Positive/-- (04/25 0926) Antibody: Negative (04/25 0926) Rubella: 32.40 (04/25 0926) RPR: Non Reactive (08/15 0921)  HBsAg: Negative (04/25 0926)  HIV: Non Reactive (08/15 0921)  GBS: Negative/-- (10/03 1039)   Prenatal Transfer Tool  Maternal Diabetes: No Genetic Screening: Normal Maternal Ultrasounds/Referrals: Normal Fetal Ultrasounds or other Referrals:  None Maternal Substance Abuse:  No Significant Maternal Medications:  None Significant Maternal Lab Results:  Group B Strep negative Number of Prenatal Visits:greater than 3 verified prenatal visits  Results for orders placed or performed during the hospital encounter of 01/04/23 (from the past 24 hour(s))  CBC   Collection Time: 01/04/23  3:45 AM  Result Value Ref Range   WBC 10.7 (H) 4.0 - 10.5 K/uL   RBC 4.76 3.87 - 5.11 MIL/uL   Hemoglobin 11.8 (L) 12.0 - 15.0 g/dL   HCT 82.9 56.2 - 13.0 %   MCV 77.1 (L) 80.0 - 100.0 fL   MCH 24.8 (L) 26.0 - 34.0 pg   MCHC 32.2 30.0 - 36.0 g/dL   RDW 86.5 78.4 - 69.6 %   Platelets 143 (L) 150 - 400 K/uL   nRBC 0.0 0.0 - 0.2 %  Type and screen [facility]   Collection Time: 01/04/23  3:45 AM  Result Value Ref Range   ABO/RH(D) PENDING    Antibody Screen PENDING    Sample Expiration      01/07/2023,2359 Performed at [facility], [address] [place], [place] [address]     Patient Active Problem List   Diagnosis Date Noted   Supervision of other normal pregnancy, antepartum 07/12/2022    Assessment/Plan:  Rhonda Fischer is a 32 y.o. G4P3003 at [redacted]w[redacted]d here for Spontaneous onset of labor  #Labor: Admitted in latent phase of labor. Will continue expectant management. Consider AROM/pit if contractions space out or patient desires.  #Pain: Per patient request  #FWB: Cat II for variable decels, overall reassuring given moderate variability, reactive.  #ID:  GBS neg  #MOF: Breastfeeding #MOC: BTL  #Circ:   Yes  This patient's plan of care has been discussed with the OB fellow Dr. Leanora Cover. Please see attestation.    Charma Igo, MD  01/04/2023, 3:43 AM  GME ATTESTATION:  Evaluation and management procedures were performed by the [facility] Medicine Resident under my supervision. I was immediately available for direct supervision, assistance and direction throughout this encounter.  I also confirm that I have verified the information documented in the resident's note, and that I have also personally reperformed the pertinent components of the physical exam and all of the medical decision making activities.  I have also made any necessary editorial changes.  Wyn Forster, MD OB Fellow, Faculty Practice [facility], Center for [facility] 01/04/2023 5:39 AM

## 2023-01-04 NOTE — Plan of Care (Signed)
  Problem: Education: Goal: Understanding of post-operative needs will improve Outcome: Progressing Goal: Individualized Educational Video(s) Outcome: Progressing   Problem: Clinical Measurements: Goal: Postoperative complications will be avoided or minimized Outcome: Progressing   Problem: Respiratory: Goal: Will regain and/or maintain adequate ventilation Outcome: Progressing   Problem: Education: Goal: Knowledge of General Education information will improve Description: Including pain rating scale, medication(s)/side effects and non-pharmacologic comfort measures Outcome: Progressing   Problem: Health Behavior/Discharge Planning: Goal: Ability to manage health-related needs will improve Outcome: Progressing   Problem: Clinical Measurements: Goal: Ability to maintain clinical measurements within normal limits will improve Outcome: Progressing Goal: Will remain free from infection Outcome: Progressing Goal: Diagnostic test results will improve Outcome: Progressing Goal: Respiratory complications will improve Outcome: Progressing Goal: Cardiovascular complication will be avoided Outcome: Progressing   Problem: Activity: Goal: Risk for activity intolerance will decrease Outcome: Progressing   Problem: Nutrition: Goal: Adequate nutrition will be maintained Outcome: Progressing   Problem: Coping: Goal: Level of anxiety will decrease Outcome: Progressing   Problem: Elimination: Goal: Will not experience complications related to bowel motility Outcome: Progressing Goal: Will not experience complications related to urinary retention Outcome: Progressing   Problem: Pain Managment: Goal: General experience of comfort will improve Outcome: Progressing   Problem: Safety: Goal: Ability to remain free from injury will improve Outcome: Progressing   Problem: Skin Integrity: Goal: Risk for impaired skin integrity will decrease Outcome: Progressing   Problem:  Education: Goal: Knowledge of Childbirth will improve Outcome: Progressing Goal: Ability to make informed decisions regarding treatment and plan of care will improve Outcome: Progressing Goal: Ability to state and carry out methods to decrease the pain will improve Outcome: Progressing Goal: Individualized Educational Video(s) Outcome: Progressing   Problem: Coping: Goal: Ability to verbalize concerns and feelings about labor and delivery will improve Outcome: Progressing   Problem: Life Cycle: Goal: Ability to make normal progression through stages of labor will improve Outcome: Progressing Goal: Ability to effectively push during vaginal delivery will improve Outcome: Progressing   Problem: Role Relationship: Goal: Will demonstrate positive interactions with the child Outcome: Progressing   Problem: Safety: Goal: Risk of complications during labor and delivery will decrease Outcome: Progressing   Problem: Pain Management: Goal: Relief or control of pain from uterine contractions will improve Outcome: Progressing

## 2023-01-05 LAB — CBC
HCT: 33.6 % — ABNORMAL LOW (ref 36.0–46.0)
Hemoglobin: 10.7 g/dL — ABNORMAL LOW (ref 12.0–15.0)
MCH: 24.3 pg — ABNORMAL LOW (ref 26.0–34.0)
MCHC: 31.8 g/dL (ref 30.0–36.0)
MCV: 76.2 fL — ABNORMAL LOW (ref 80.0–100.0)
Platelets: 146 10*3/uL — ABNORMAL LOW (ref 150–400)
RBC: 4.41 MIL/uL (ref 3.87–5.11)
RDW: 15.7 % — ABNORMAL HIGH (ref 11.5–15.5)
WBC: 13 10*3/uL — ABNORMAL HIGH (ref 4.0–10.5)
nRBC: 0 % (ref 0.0–0.2)

## 2023-01-05 MED ORDER — INFLUENZA VIRUS VACC SPLIT PF (FLUZONE) 0.5 ML IM SUSY
0.5000 mL | PREFILLED_SYRINGE | INTRAMUSCULAR | Status: AC
  Administered 2023-01-05: 0.5 mL via INTRAMUSCULAR
  Filled 2023-01-05: qty 0.5

## 2023-01-05 MED ORDER — INFLUENZA VAC A&B SURF ANT ADJ 0.5 ML IM SUSY: 0.5000 mL | PREFILLED_SYRINGE | INTRAMUSCULAR | Status: DC

## 2023-01-05 MED ORDER — INFLUENZA VIRUS VACC SPLIT PF (FLUZONE) 0.5 ML IM SUSY: 0.5000 mL | PREFILLED_SYRINGE | INTRAMUSCULAR | Status: DC

## 2023-01-05 MED ORDER — SENNOSIDES-DOCUSATE SODIUM 8.6-50 MG PO TABS: 2.0000 | ORAL_TABLET | Freq: Two times a day (BID) | ORAL | 0 refills | Status: DC | PRN

## 2023-01-05 NOTE — Progress Notes (Signed)
POSTPARTUM PROGRESS NOTE  PPD #1  Subjective:  Rhonda Fischer is a 32 y.o. [id number] s/p NSVD at [redacted]w[redacted]d. Today she notes she is doing well. She denies any problems with ambulating, voiding or po intake. Denies nausea or vomiting. She has passed flatus, +BM.  Pain is well controlled.  Lochia appropriate Denies fever/chills/chest pain/SOB.   Objective: Blood pressure 106/72, pulse 92, temperature 98.5 F (36.9 C), temperature source Oral, resp. rate 20, height 5\' 6"  (1.676 m), weight 77.6 kg, last menstrual period 03/19/2022, SpO2 100%, unknown if currently breastfeeding.  Physical Exam:  General: alert, cooperative and no distress Chest: no respiratory distress Heart: regular rate and rhythm Abdomen: soft, nontender Uterine Fundus: firm, appropriately tender Incision: NA DVT Evaluation: No calf swelling or tenderness Extremities: no edema Skin: warm, dry  Results for orders placed or performed during the hospital encounter of 01/04/23 (from the past 24 hour(s))  CBC     Status: Abnormal   Collection Time: 01/05/23  4:24 AM  Result Value Ref Range   WBC 13.0 (H) 4.0 - 10.5 K/uL   RBC 4.41 3.87 - 5.11 MIL/uL   Hemoglobin 10.7 (L) 12.0 - 15.0 g/dL   HCT 40.1 (L) 02.7 - 25.3 %   MCV 76.2 (L) 80.0 - 100.0 fL   MCH 24.3 (L) 26.0 - 34.0 pg   MCHC 31.8 30.0 - 36.0 g/dL   RDW 66.4 (H) 40.3 - 47.4 %   Platelets 146 (L) 150 - 400 K/uL   nRBC 0.0 0.0 - 0.2 %    Assessment/Plan: Rhonda Fischer is a 32 y.o. [id number] s/p NSVD at [redacted]w[redacted]d PPD#1  -pain well controlled -continue routine postpartum care  Contraception: IUD Feeding: breast Baby boy circ to be completed, consent completed-see below   Dispo: Continue routine postpartum care, plan for discharge home tomorrow  Circumcision Consent  Discussed with mom at bedside about circumcision.   Circumcision is a surgery that removes the skin that covers the tip of the penis, called the "foreskin." Circumcision is usually  done when a boy is between 14 and 74 days old, sometimes up to 48-83 weeks old.  The most common reasons boys are circumcised include for cultural/religious beliefs or for parental preference (potentially easier to clean, so baby looks like daddy, etc).  There may be some medical benefits for circumcision:   Circumcised boys seem to have slightly lower rates of: ? Urinary tract infections  ? Penis cancer  ? Sexually transmitted infection  ? Phimosis  Boys and men who are not circumcised can reduce these extra risks by: ? Cleaning their penis well ? Using condoms during sex  What are the risks of circumcision?  As with any surgical procedure, there are risks and complications. In circumcision, complications are rare and usually minor, the most common being: ? Bleeding- risk is reduced by holding each clamp for 30 seconds prior to a cut being made, and by holding pressure after the procedure is done ? Infection- the penis is cleaned prior to the procedure, and the procedure is done under sterile technique ? Damage to the urethra or amputation of the penis  How is circumcision done in baby boys?  The baby will be placed on a special table and the legs restrained for their safety. Numbing medication is injected into the penis, and the skin is cleansed with betadine to decrease the risk of infection.   What to expect:  The penis will look red and raw for 5-7 days as it  heals. We expect scabbing around where the cut was made, as well as clear-pink fluid and some swelling of the penis right after the procedure. If your baby's circumcision starts to bleed or develops pus, please contact your pediatrician immediately.  All questions were answered and mother consented.  Myna Hidalgo, DO Attending Obstetrician & Gynecologist, [facility] [employer], [facility]

## 2023-01-05 NOTE — Lactation Note (Signed)
This note was copied from a baby's chart. Lactation Consultation Note  Patient Name: Rhonda Fischer [id number] Date: 01/05/2023 Age:34 hours Reason for consult: Follow-up assessment;Term  P4- MOB states that feedings are going well and denies having any questions or concerns at this moment. MOB requested more formula before discharge and LC provided this for her.  LC reviewed feeding infant on cue 8-12x in 24 hrs, not allowing infant to go over 3 hrs without a feeding, CDC milk storage guidelines, LC services handout and engorgement/breast care. LC encouraged MOB to call for further assistance as needed.  Maternal Data Does the patient have breastfeeding experience prior to this delivery?: Yes  Feeding Mother's Current Feeding Choice: Breast Milk and Formula  Lactation Tools Discussed/Used Pump Education: Milk Storage  Interventions Interventions: Breast feeding basics reviewed;Education;LC Services brochure  Discharge Discharge Education: Engorgement and breast care;Warning signs for feeding baby  Consult Status Consult Status: Complete Date: 01/05/23    Dema Severin BS, IBCLC 01/05/2023, 3:04 PM

## 2023-01-08 ENCOUNTER — Other Ambulatory Visit: Payer: Self-pay

## 2023-01-10 ENCOUNTER — Encounter: Payer: Managed Care, Other (non HMO) | Admitting: Family Medicine

## 2023-01-18 ENCOUNTER — Inpatient Hospital Stay (HOSPITAL_COMMUNITY)
Admission: RE | Admit: 2023-01-18 | Payer: Managed Care, Other (non HMO) | Source: Home / Self Care | Admitting: Family Medicine

## 2023-01-18 ENCOUNTER — Inpatient Hospital Stay (HOSPITAL_COMMUNITY): Payer: Managed Care, Other (non HMO)

## 2023-01-25 ENCOUNTER — Telehealth (HOSPITAL_COMMUNITY): Payer: Self-pay | Admitting: *Deleted

## 2023-01-25 NOTE — Telephone Encounter (Signed)
01/25/2023  Name: Sharyl Tuey MRN: [id number] DOB: 1990-09-05  Reason for Call:  Transition of Care Hospital Discharge Call  Contact Status: Patient Contact Status: Message  Language assistant needed: Interpreter Mode: Telephonic Interpreter Interpreter Name: Benay Pike [phone]        Follow-Up Questions:    Inocente Salles Postnatal Depression Scale:  In the Past 7 Days:    PHQ2-9 Depression Scale:     Discharge Follow-up:    Post-discharge interventions: NA  Salena Saner, RN 01/25/2023 15:15

## 2023-02-07 ENCOUNTER — Encounter: Payer: Self-pay | Admitting: Family Medicine

## 2023-02-07 ENCOUNTER — Ambulatory Visit: Payer: Managed Care, Other (non HMO) | Admitting: Family Medicine

## 2023-02-07 MED ORDER — METOCLOPRAMIDE HCL 10 MG PO TABS: 10.0000 mg | ORAL_TABLET | Freq: Four times a day (QID) | ORAL | 2 refills | Status: AC

## 2023-02-07 NOTE — Progress Notes (Signed)
Post Partum Visit Note  Gavin Kohut is a 32 y.o. [phone] female who presents for a postpartum visit. She is 4 weeks postpartum following a normal spontaneous vaginal delivery.  I have fully reviewed the prenatal and intrapartum course. The delivery was at [redacted]w[redacted]d gestational weeks.  Anesthesia: none. Postpartum course has been uncomplicated. Baby is doing well. Baby is feeding by breast. Bleeding no bleeding. Bowel function is normal. Bladder function is normal. Patient is not sexually active. Contraception method is none. Postpartum depression screening: negative.   The pregnancy intention screening data noted above was reviewed. Potential methods of contraception were discussed. The patient elected to proceed with No data recorded.   Edinburgh Postnatal Depression Scale - 02/07/23 1453       Edinburgh Postnatal Depression Scale:  In the Past 7 Days   I have been able to laugh and see the funny side of things. 0    I have looked forward with enjoyment to things. 0    I have blamed myself unnecessarily when things went wrong. 0    I have been anxious or worried for no good reason. 0    I have felt scared or panicky for no good reason. 0    Things have been getting on top of me. 0    I have been so unhappy that I have had difficulty sleeping. 0    I have felt sad or miserable. 0    I have been so unhappy that I have been crying. 0    The thought of harming myself has occurred to me. 0    Edinburgh Postnatal Depression Scale Total 0             Health Maintenance Due  Topic Date Due   COVID-19 Vaccine (1 - 2023-24 season) Never done    The following portions of the patient's history were reviewed and updated as appropriate: allergies, current medications, past family history, past medical history, past social history, past surgical history, and problem list.  Review of Systems Pertinent items are noted in HPI.  Objective:  BP 126/79   Pulse 77   Ht 5\' 6"  (1.676  m)   Wt 178 lb (80.7 kg)   LMP 03/19/2022 (Approximate)   Breastfeeding Yes   BMI 28.73 kg/m    General:  alert, cooperative, and no distress   Breasts:  not indicated  Lungs: clear to auscultation bilaterally  Heart:  regular rate and rhythm, S1, S2 normal, no murmur, click, rub or gallop  Abdomen: soft, non-tender; bowel sounds normal; no masses,  no organomegaly   Wound N/a  GU exam:  not indicated       Assessment:   1. Postpartum exam      Plan:   Essential components of care per ACOG recommendations:  1.  Mood and well being: Patient with negative depression screening today. Reviewed local resources for support.  - Patient tobacco use? No.   - hx of drug use? No.    2. Infant care and feeding:  -Patient currently breastmilk feeding? Yes. Reviewed importance of draining breast regularly to support lactation.  -Social determinants of health (SDOH) reviewed in EPIC. No concerns  3. Sexuality, contraception and birth spacing - Patient does not want a pregnancy in the next year.   - Reviewed reproductive life planning. Reviewed contraceptive methods based on pt preferences and effectiveness.  Patient desired IUD or IUS today.   - Discussed birth spacing of 18 months  4.  Sleep and fatigue -Encouraged family/partner/community support of 4 hrs of uninterrupted sleep to help with mood and fatigue  5. Physical Recovery  - Discussed patients delivery and complications. She describes her labor as good. - Patient had a Vaginal, no problems at delivery. Patient had a  no  laceration. Perineal healing reviewed. Patient expressed understanding - Patient has urinary incontinence? No. - Patient is safe to resume physical and sexual activity  6.  Health Maintenance - HM due items addressed Yes - Last pap smear  Diagnosis  Date Value Ref Range Status  07/12/2022   Final   - Negative for Intraepithelial Lesions or Malignancy (NILM)  07/12/2022 - Benign reactive/reparative  changes  Final   Pap smear not done at today's visit.  -Breast Cancer screening indicated? No.   7. Chronic Disease/Pregnancy Condition follow up: None  - PCP follow up  Levie Heritage, DO [facility], [facility]

## 2023-02-07 NOTE — Patient Instructions (Signed)
For Breastfeeding:   NOTHING WILL WORK UNLESS YOU ARE  - Emptying the breast adequately/completely - Emptying the breast regularly  Supplement/Herb Purpose Dose Side Effects  Vitamin D Increase Vitamin D to infant, recommended for all breastfeeding moms and can use instead of supplementing infant 4000 IU daily None  Fenugreek** use with extreme caution as this can decrease milk supply in some especially those with thyroid disorders  Increases prolactin  '400mg'$  TID (max) Nausea, Loose stools and smelling like maple syrup  Goat's Rue Help increase differentiation of breast tissue, good for women with suspected IGT. Helps with insulin sensitvity 1 capsule BID Nausea, loose stools  Legendairy Milk Supplements -PumpPrincess -Liquid Gold -Cash Cow -Milkapalooza  Combination of herbal galactogues  Per packaging Per packaging  Lactation cookies/bars Food based galactogues/increase maternal calories All the time, q2-3 hours    Flax seeds Food based Galactogue Daily GI upset, nausea  Brewer's yeast Food based Galactogue Daily GI upset, nausea  Hemp Hearts Food based Galactogue Daily GI upset, nausea  Oats Food based Galactogue Daily   Lecithin (Soy or Sunflower) Decreases the viscosity of milk, galactogue, fat emulsifier  Good for oversupply mothers to prevent clogged ducts '1200mg'$  three times per day GI upset  Rehydration drinks (Gatorade/Powerade) Improves maternal hydration and mammary glands are histologically similar to sweat glands  None, caution use in patients with T2DM

## 2023-02-13 ENCOUNTER — Ambulatory Visit: Payer: Managed Care, Other (non HMO) | Admitting: Family Medicine

## 2023-02-13 ENCOUNTER — Encounter: Payer: Self-pay | Admitting: Family Medicine

## 2023-02-13 VITALS — BP 107/80 | HR 67 | Ht 66.0 in | Wt 178.0 lb

## 2023-02-13 DIAGNOSIS — Z30430 Encounter for insertion of intrauterine contraceptive device: Secondary | ICD-10-CM

## 2023-02-13 DIAGNOSIS — Z3202 Encounter for pregnancy test, result negative: Secondary | ICD-10-CM

## 2023-02-13 LAB — POCT URINE PREGNANCY: Preg Test, Ur: NEGATIVE

## 2023-02-13 MED ORDER — LEVONORGESTREL 20.1 MCG/DAY IU IUD
1.0000 | INTRAUTERINE_SYSTEM | Freq: Once | INTRAUTERINE | Status: AC
Start: 2023-02-13 — End: 2023-02-13
  Administered 2023-02-13: 1 via INTRAUTERINE

## 2023-02-13 NOTE — Progress Notes (Signed)
IUD Procedure Note ?Patient identified, informed consent performed, signed copy in chart, time out was performed.  Urine pregnancy test negative. ? ?Speculum placed in the vagina.  Cervix visualized.  Cleaned with Betadine x 2.  Paracervical block placed with Lidocaine 2% with epinephrine 10mL spread between the 12 o'clock, 4 o'clock, 8 o'clock positions. Cervix grasped anteriorly with a single tooth tenaculum.  Uterus sounded to 9 cm.  Liletta  IUD placed per manufacturer's recommendations.  Strings trimmed to 3 cm. Tenaculum was removed, good hemostasis noted.  Patient tolerated procedure well.  ? ?Patient given post procedure instructions and Liletta care card with expiration date.  Patient is asked to check IUD strings periodically and follow up in 4-6 weeks for IUD check. ?

## 2023-03-21 ENCOUNTER — Ambulatory Visit: Payer: Managed Care, Other (non HMO) | Admitting: Family Medicine

## 2023-05-30 ENCOUNTER — Ambulatory Visit
Admission: EM | Admit: 2023-05-30 | Discharge: 2023-05-30 | Disposition: A | Discharge status: Home / Self Care | Attending: Family Medicine | Admitting: Family Medicine

## 2023-05-30 DIAGNOSIS — J029 Acute pharyngitis, unspecified: Secondary | ICD-10-CM

## 2023-05-30 DIAGNOSIS — R509 Fever, unspecified: Secondary | ICD-10-CM | POA: Diagnosis not present

## 2023-05-30 DIAGNOSIS — J020 Streptococcal pharyngitis: Secondary | ICD-10-CM

## 2023-05-30 LAB — POCT RAPID STREP A (OFFICE): Rapid Strep A Screen: POSITIVE — AB

## 2023-05-30 NOTE — ED Triage Notes (Signed)
 Pt presents to uc with husband and co of sore throat for one day with fever. Pt has been taking motrin for symptoms

## 2023-05-30 NOTE — ED Provider Notes (Signed)
 [facility]    CSN: [id number] Arrival date & time: 05/30/23  1620      History   Chief Complaint Chief Complaint  Patient presents with   Sore Throat    HPI Rhonda Fischer is a 33 y.o. female.   HPI 33 year old female presents with sore throat and fever for 1 day.  PMH significant for language barrier (speaks [place] only) and GERD.  Patient is accompanied by her husband this afternoon.  Past Medical History:  Diagnosis Date   GERD (gastroesophageal reflux disease)    Language barrier 05/08/2012   Speaks Creole only.  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   Past Surgical History:  Procedure Laterality Date   NO PAST SURGERIES      OB History     Gravida  4   Para  4   Term  4   Preterm  0   AB  0   Living  4      SAB  0   IAB  0   Ectopic  0   Multiple  0   Live Births  4            Home Medications    Prior to Admission medications   Medication Sig Start Date End Date Taking? Authorizing Provider  famotidine (PEPCID) 20 MG tablet Take 1 tablet (20 mg total) by mouth daily. 06/08/22 07/08/22  Judeth Horn, NP  metoCLOPramide (REGLAN) 10 MG tablet Take 1 tablet (10 mg total) by mouth 4 (four) times daily. 02/07/23   Levie Heritage, DO    Family History Family History  Problem Relation Age of Onset   Cancer Neg Hx    Hypertension Neg Hx     Social History Social History   Tobacco Use   Smoking status: Never   Smokeless tobacco: Never  Vaping Use   Vaping status: Never Used  Substance Use Topics   Alcohol  use: No   Drug use: No     Allergies   Patient has no known allergies.   Review of Systems Review of Systems   Physical Exam Triage Vital Signs ED Triage Vitals  Encounter Vitals Group     BP      Systolic BP Percentile      Diastolic BP Percentile      Pulse      Resp      Temp      Temp src      SpO2      Weight      Height      Head Circumference      Peak Flow      Pain Score      Pain Loc      Pain Education      Exclude from Growth Chart    No data found.  Updated Vital Signs BP 121/80   Pulse (!) 103   Temp 99.4 F (37.4 C)   Resp 16   SpO2 98%   Breastfeeding Unknown   Visual Acuity Right Eye Distance:  Left Eye Distance:   Bilateral Distance:    Right Eye Near:   Left Eye Near:    Bilateral Near:     Physical Exam Vitals and nursing note reviewed.  Constitutional:      Appearance: Normal appearance. She is obese.  HENT:     Head: Normocephalic and atraumatic.     Right Ear: Tympanic membrane, ear canal and external ear normal.     Left Ear: Tympanic membrane, ear canal and external ear normal.     Mouth/Throat:     Mouth: Mucous membranes are moist.     Pharynx: Oropharynx is clear. Uvula midline. Posterior oropharyngeal erythema and uvula swelling present.     Tonsils: 3+ on the right. 3+ on the left.  Eyes:     Extraocular Movements: Extraocular movements intact.     Conjunctiva/sclera: Conjunctivae normal.     Pupils: Pupils are equal, round, and reactive to light.  Cardiovascular:     Rate and Rhythm: Normal rate and regular rhythm.     Pulses: Normal pulses.     Heart sounds: Normal heart sounds.  Pulmonary:     Effort: Pulmonary effort is normal.     Breath sounds: Normal breath sounds. No wheezing, rhonchi or rales.  Musculoskeletal:        General: Normal range of motion.     Cervical back: Normal range of motion and neck supple.  Skin:    General: Skin is warm and dry.  Neurological:     General: No focal deficit  present.     Mental Status: She is alert and oriented to person, place, and time. Mental status is at baseline.  Psychiatric:        Mood and Affect: Mood normal.        Behavior: Behavior normal.      UC Treatments / Results  Labs (all labs ordered are listed, but only abnormal results are displayed) Labs Reviewed  POCT RAPID STREP A (OFFICE) - Abnormal; Notable for the following components:      Result Value   Rapid Strep A Screen Positive (*)    All other components within normal limits    EKG   Radiology No results found.  Procedures Procedures (including critical care time)  Medications Ordered in UC Medications - No data to display  Initial Impression / Assessment and Plan / UC Course  I have reviewed the triage vital signs and the nursing notes.  Pertinent labs & imaging results that were available during my care of the patient were reviewed by me and considered in my medical decision making (see chart for details).    MDM: 1.  Strep pharyngitis-Rx'd Augmentin 875/125 mg tablet: Take 1 tablet twice daily x 7 days; 2.  Fever, unspecified-Advised patient may take OTC Tylenol 1 g every 6 hours for fever (oral temperature greater than 100.3).  3.  Sore throat-Rx prednisone 20 mg tablet: Take 3 tabs p.o. daily x 5 days. Advised patient to take medications as directed with food to completion.  Advised patient to take prednisone with first dose of Augmentin for the next 5 of 7 days.  Advised patient may take OTC Tylenol 1 g every 6 hours for fever (oral temperature greater than 100.3).  Encouraged increase daily water intake to 64 ounces per day while taking these medications.  Advised if symptoms worsen and/or unresolved please follow-up with your PCP or here for further evaluation.  Patient discharged home, hemodynamically stable. Final Clinical Impressions(s) / UC  Diagnoses   Final diagnoses:  Sore throat  Fever, unspecified  Strep pharyngitis     Discharge  Instructions      Advised patient to take medications as directed with food to completion.  Advised patient to take prednisone with first dose of Augmentin for the next 5 of 7 days.  Advised patient may take OTC Tylenol 1 g every 6 hours for fever (oral temperature greater than 100.3).  Encouraged increase daily water intake to 64 ounces per day while taking these medications.  Advised if symptoms worsen and/or unresolved please follow-up with your PCP or here for further evaluation.     ED Prescriptions   None    PDMP not reviewed this encounter.   Trevor Iha, FNP 05/30/23 [phone]

## 2023-05-30 NOTE — Discharge Instructions (Addendum)
 Advised patient to take medications as directed with food to completion.  Advised patient to take prednisone with first dose of Augmentin for the next 5 of 7 days.  Advised patient may take OTC Tylenol 1 g every 6 hours for fever (oral temperature greater than 100.3).  Encouraged increase daily water intake to 64 ounces per day while taking these medications.  Advised if symptoms worsen and/or unresolved please follow-up with your PCP or here for further evaluation.

## 2023-09-22 ENCOUNTER — Ambulatory Visit
Admission: EM | Admit: 2023-09-22 | Discharge: 2023-09-22 | Disposition: A | Discharge status: Home / Self Care | Attending: Family Medicine | Admitting: Family Medicine

## 2023-09-22 ENCOUNTER — Encounter: Payer: Self-pay | Admitting: Emergency Medicine

## 2023-09-22 DIAGNOSIS — M5459 Other low back pain: Secondary | ICD-10-CM

## 2023-09-22 MED ORDER — NAPROXEN SODIUM 550 MG PO TABS: 550.0000 mg | ORAL_TABLET | Freq: Two times a day (BID) | ORAL | 0 refills | Status: AC

## 2023-09-22 MED ORDER — CYCLOBENZAPRINE HCL 10 MG PO TABS: 10.0000 mg | ORAL_TABLET | Freq: Every day | ORAL | 0 refills | Status: AC

## 2023-09-22 NOTE — ED Triage Notes (Signed)
 Patient c/o low back pain x 1 month, no apparent injury.  Denies any urinary sx's.  Patient has been taken Ibuprofen  and Tylenol .

## 2023-09-22 NOTE — ED Provider Notes (Signed)
 Rhonda Fischer    CSN: [id number] Arrival date & time: 09/22/23  1509      History   Chief Complaint Chief Complaint  Patient presents with   Back Pain    HPI Rhonda Fischer is a 33 y.o. female.   Patient states she has had back pain for about a month.  She works in a job where she does a lot of bending and lifting.  She thinks it is just from overuse.  She has had no accident or injury.  She does not have any chronic back condition.  It hurts in the central low back.  It is worse with activity and better with rest.  Certain movements make back pain worse.  No numbness or weakness into the legs.  She has tried some over-the-counter medicines    Past Medical History:  Diagnosis Date   GERD (gastroesophageal reflux disease)    Language barrier 05/08/2012   Speaks Creole only.  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   Past Surgical History:  Procedure Laterality Date   NO PAST SURGERIES      OB History     Gravida  4   Para  4   Term  4   Preterm  0   AB  0   Living  4      SAB  0   IAB  0   Ectopic  0   Multiple  0   Live Births  4            Home Medications    Prior to Admission medications   Medication Sig Start Date End Date Taking? Authorizing Provider  cyclobenzaprine  (FLEXERIL ) 10 MG tablet Take 1 tablet (10 mg total) by mouth at bedtime. 09/22/23  Yes Maranda Jamee Jacob, MD  naproxen  sodium (ANAPROX  DS) 550 MG tablet Take 1 tablet (550 mg total) by mouth 2 (two) times daily with a meal. 09/22/23  Yes Maranda Jamee Jacob, MD   famotidine  (PEPCID ) 20 MG tablet Take 1 tablet (20 mg total) by mouth daily. 06/08/22 07/08/22  Jerilynn Longs, NP  metoCLOPramide  (REGLAN ) 10 MG tablet Take 1 tablet (10 mg total) by mouth 4 (four) times daily. 02/07/23   Stinson, Jacob J, DO    Family History Family History  Problem Relation Age of Onset   Cancer Neg Hx    Hypertension Neg Hx     Social History Social History   Tobacco Use   Smoking status: Never   Smokeless tobacco: Never  Vaping Use   Vaping status: Never Used  Substance Use Topics   Alcohol  use: No   Drug use: No     Allergies   Patient has no known allergies.   Review of Systems Review of Systems See HPI  Physical Exam Triage Vital Signs ED Triage Vitals  Encounter Vitals Group     BP 09/22/23 1516 115/78     Girls Systolic BP Percentile --      Girls Diastolic BP Percentile --      Boys Systolic BP Percentile --  Boys Diastolic BP Percentile --      Pulse Rate 09/22/23 1516 68     Resp 09/22/23 1516 18     Temp 09/22/23 1516 99.5 F (37.5 C)     Temp Source 09/22/23 1516 Oral     SpO2 09/22/23 1516 98 %     Weight --      Height --      Head Circumference --      Peak Flow --      Pain Score 09/22/23 1517 10     Pain Loc --      Pain Education --      Exclude from Growth Chart --    No data found.  Updated Vital Signs BP 115/78 (BP Location: Right Arm)   Pulse 68   Temp 99.5 F (37.5 C) (Oral)   Resp 18   SpO2 98%   Breastfeeding Yes   Visual Acuity Right Eye Distance:   Left Eye Distance:   Bilateral Distance:    Right Eye Near:   Left Eye Near:    Bilateral Near:     Physical Exam Constitutional:      General: She is not in acute distress.    Appearance: She is well-developed.  HENT:     Head: Normocephalic and atraumatic.  Eyes:     Conjunctiva/sclera: Conjunctivae normal.     Pupils: Pupils are equal, round, and reactive to light.  Cardiovascular:     Rate and Rhythm: Normal rate.  Pulmonary:      Effort: Pulmonary effort is normal. No respiratory distress.  Abdominal:     General: There is no distension.     Palpations: Abdomen is soft.  Musculoskeletal:        General: Tenderness present. Normal range of motion.     Cervical back: Normal range of motion.     Comments: Tenderness at the L5-S1 junction centrally and in the right SI joint.  Mild tenderness in the lumbar column of muscles.  Slow but full range of motion of the back.  Patient has forward flex to fingertips just above the floor.  Strength, sensation, range of motion, and reflexes are normal in both lower extremities.  Straight leg raising is negative  Skin:    General: Skin is warm and dry.  Neurological:     Mental Status: She is alert.      UC Treatments / Results  Labs (all labs ordered are listed, but only abnormal results are displayed) Labs Reviewed - No data to display  EKG   Radiology No results found.  Procedures Procedures (including critical Fischer time)  Medications Ordered in UC Medications - No data to display  Initial Impression / Assessment and Plan / UC Course  I have reviewed the triage vital signs and the nursing notes.  Pertinent labs & imaging results that were available during my Fischer of the patient were reviewed by me and considered in my medical decision making (see chart for details).     Final Clinical Impressions(s) / UC Diagnoses   Final diagnoses:  Acute mechanical low back pain with duration of less than six weeks     Discharge Instructions      Take Anaprox  2 times a day with food Take the Flexeril  at bedtime Use ice or heat to painful muscles See your doctor if you fail to improve in a week or 2   ED Prescriptions     Medication Sig Dispense Auth. Provider   naproxen   sodium (ANAPROX  DS) 550 MG tablet Take 1 tablet (550 mg total) by mouth 2 (two) times daily with a meal. 30 tablet Maranda Jamee Jacob, MD   cyclobenzaprine  (FLEXERIL ) 10 MG tablet Take 1 tablet  (10 mg total) by mouth at bedtime. 20 tablet Maranda Jamee Jacob, MD      PDMP not reviewed this encounter.   Maranda Jamee Jacob, MD 09/22/23 [phone]

## 2023-09-22 NOTE — Discharge Instructions (Signed)
 Take Anaprox  2 times a day with food Take the Flexeril  at bedtime Use ice or heat to painful muscles See your doctor if you fail to improve in a week or 2
